# Patient Record
Sex: Female | Born: 1965 | Race: Black or African American | Hispanic: No | Marital: Single | Smoking: Current some day smoker
Health system: Southern US, Community
[De-identification: ages and names within clinical notes are randomized; demographics above are authoritative.]

## PROBLEM LIST (undated history)

## (undated) DIAGNOSIS — I10 Essential (primary) hypertension: Secondary | ICD-10-CM

## (undated) DIAGNOSIS — E119 Type 2 diabetes mellitus without complications: Secondary | ICD-10-CM

## (undated) HISTORY — DX: Essential (primary) hypertension: I10

## (undated) HISTORY — PX: OTHER SURGICAL HISTORY: SHX169

## (undated) HISTORY — DX: Type 2 diabetes mellitus without complications: E11.9

## (undated) HISTORY — PX: FRACTURE SURGERY: SHX138

## (undated) HISTORY — PX: HAND SURGERY: SHX662

## (undated) HISTORY — PX: TUBAL LIGATION: SHX77

---

## 2000-11-06 ENCOUNTER — Emergency Department (HOSPITAL_COMMUNITY): Admission: EM | Admit: 2000-11-06 | Discharge: 2000-11-06 | Payer: Self-pay | Admitting: Emergency Medicine

## 2000-11-06 ENCOUNTER — Encounter: Payer: Self-pay | Admitting: Emergency Medicine

## 2001-11-11 ENCOUNTER — Encounter (INDEPENDENT_AMBULATORY_CARE_PROVIDER_SITE_OTHER): Payer: Self-pay | Admitting: *Deleted

## 2001-11-14 ENCOUNTER — Other Ambulatory Visit: Admission: RE | Admit: 2001-11-14 | Discharge: 2001-11-14 | Payer: Self-pay | Admitting: Sports Medicine

## 2001-11-14 ENCOUNTER — Encounter: Admission: RE | Admit: 2001-11-14 | Discharge: 2001-11-14 | Payer: Self-pay | Admitting: Sports Medicine

## 2002-05-23 ENCOUNTER — Emergency Department (HOSPITAL_COMMUNITY): Admission: EM | Admit: 2002-05-23 | Discharge: 2002-05-23 | Payer: Self-pay | Admitting: Emergency Medicine

## 2002-05-23 ENCOUNTER — Encounter: Payer: Self-pay | Admitting: Emergency Medicine

## 2003-12-09 ENCOUNTER — Emergency Department (HOSPITAL_COMMUNITY): Admission: EM | Admit: 2003-12-09 | Discharge: 2003-12-09 | Payer: Self-pay

## 2003-12-23 ENCOUNTER — Emergency Department (HOSPITAL_COMMUNITY): Admission: EM | Admit: 2003-12-23 | Discharge: 2003-12-23 | Payer: Self-pay | Admitting: Emergency Medicine

## 2005-07-06 ENCOUNTER — Emergency Department (HOSPITAL_COMMUNITY): Admission: EM | Admit: 2005-07-06 | Discharge: 2005-07-06 | Payer: Self-pay | Admitting: Emergency Medicine

## 2005-07-07 ENCOUNTER — Emergency Department (HOSPITAL_COMMUNITY): Admission: EM | Admit: 2005-07-07 | Discharge: 2005-07-07 | Payer: Self-pay | Admitting: Emergency Medicine

## 2005-07-11 ENCOUNTER — Emergency Department (HOSPITAL_COMMUNITY): Admission: EM | Admit: 2005-07-11 | Discharge: 2005-07-11 | Payer: Self-pay | Admitting: Emergency Medicine

## 2006-11-10 DIAGNOSIS — F17200 Nicotine dependence, unspecified, uncomplicated: Secondary | ICD-10-CM | POA: Insufficient documentation

## 2006-11-11 ENCOUNTER — Encounter (INDEPENDENT_AMBULATORY_CARE_PROVIDER_SITE_OTHER): Payer: Self-pay | Admitting: *Deleted

## 2008-06-04 ENCOUNTER — Ambulatory Visit: Payer: Self-pay | Admitting: Family Medicine

## 2008-06-04 ENCOUNTER — Encounter (INDEPENDENT_AMBULATORY_CARE_PROVIDER_SITE_OTHER): Payer: Self-pay | Admitting: Family Medicine

## 2008-06-04 DIAGNOSIS — D649 Anemia, unspecified: Secondary | ICD-10-CM | POA: Insufficient documentation

## 2008-06-04 DIAGNOSIS — M5430 Sciatica, unspecified side: Secondary | ICD-10-CM | POA: Insufficient documentation

## 2008-06-04 LAB — CONVERTED CEMR LAB
Chlamydia, DNA Probe: NEGATIVE
Cholesterol: 153 mg/dL (ref 0–200)
GC Probe Amp, Genital: NEGATIVE
HCT: 31.4 % — ABNORMAL LOW (ref 36.0–46.0)
HDL: 57 mg/dL (ref >39)
Hemoglobin: 8.7 g/dL — ABNORMAL LOW (ref 12.0–15.0)
RBC: 4.37 M/uL (ref 3.87–5.11)
Total CHOL/HDL Ratio: 2.7
Triglycerides: 223 mg/dL — ABNORMAL HIGH (ref <150)

## 2008-06-11 ENCOUNTER — Telehealth (INDEPENDENT_AMBULATORY_CARE_PROVIDER_SITE_OTHER): Payer: Self-pay | Admitting: *Deleted

## 2008-06-13 ENCOUNTER — Ambulatory Visit: Payer: Self-pay | Admitting: Family Medicine

## 2008-06-13 ENCOUNTER — Encounter (INDEPENDENT_AMBULATORY_CARE_PROVIDER_SITE_OTHER): Payer: Self-pay | Admitting: Family Medicine

## 2008-06-19 ENCOUNTER — Telehealth (INDEPENDENT_AMBULATORY_CARE_PROVIDER_SITE_OTHER): Payer: Self-pay | Admitting: Family Medicine

## 2008-06-19 LAB — CONVERTED CEMR LAB
Iron: 18 ug/dL — ABNORMAL LOW (ref 42–145)
Retic Ct Pct: 1.5 % (ref 0.4–3.1)
Saturation Ratios: 4 % — ABNORMAL LOW (ref 20–55)
UIBC: 421 ug/dL

## 2008-06-24 ENCOUNTER — Telehealth (INDEPENDENT_AMBULATORY_CARE_PROVIDER_SITE_OTHER): Payer: Self-pay | Admitting: *Deleted

## 2008-07-08 ENCOUNTER — Ambulatory Visit: Payer: Self-pay | Admitting: Family Medicine

## 2008-07-31 ENCOUNTER — Telehealth (INDEPENDENT_AMBULATORY_CARE_PROVIDER_SITE_OTHER): Payer: Self-pay | Admitting: *Deleted

## 2008-08-02 ENCOUNTER — Ambulatory Visit: Payer: Self-pay | Admitting: Family Medicine

## 2008-08-02 DIAGNOSIS — N938 Other specified abnormal uterine and vaginal bleeding: Secondary | ICD-10-CM | POA: Insufficient documentation

## 2008-08-02 DIAGNOSIS — N949 Unspecified condition associated with female genital organs and menstrual cycle: Secondary | ICD-10-CM | POA: Insufficient documentation

## 2008-08-06 ENCOUNTER — Ambulatory Visit (HOSPITAL_COMMUNITY): Admission: RE | Admit: 2008-08-06 | Discharge: 2008-08-06 | Payer: Self-pay | Admitting: Family Medicine

## 2008-08-20 ENCOUNTER — Encounter: Payer: Self-pay | Admitting: Family Medicine

## 2008-08-20 ENCOUNTER — Ambulatory Visit: Payer: Self-pay | Admitting: Family Medicine

## 2008-08-23 ENCOUNTER — Encounter: Payer: Self-pay | Admitting: Family Medicine

## 2008-08-26 ENCOUNTER — Encounter: Admission: RE | Admit: 2008-08-26 | Discharge: 2008-08-26 | Payer: Self-pay | Admitting: Family Medicine

## 2008-09-11 ENCOUNTER — Encounter (INDEPENDENT_AMBULATORY_CARE_PROVIDER_SITE_OTHER): Payer: Self-pay | Admitting: Family Medicine

## 2008-09-17 ENCOUNTER — Telehealth (INDEPENDENT_AMBULATORY_CARE_PROVIDER_SITE_OTHER): Payer: Self-pay | Admitting: Family Medicine

## 2008-09-17 ENCOUNTER — Ambulatory Visit: Payer: Self-pay | Admitting: Family Medicine

## 2008-09-17 ENCOUNTER — Ambulatory Visit (HOSPITAL_COMMUNITY): Admission: RE | Admit: 2008-09-17 | Discharge: 2008-09-17 | Payer: Self-pay | Admitting: Family Medicine

## 2008-09-18 ENCOUNTER — Encounter (INDEPENDENT_AMBULATORY_CARE_PROVIDER_SITE_OTHER): Payer: Self-pay | Admitting: Family Medicine

## 2008-09-19 ENCOUNTER — Encounter: Admission: RE | Admit: 2008-09-19 | Discharge: 2008-09-19 | Payer: Self-pay | Admitting: Infectious Diseases

## 2008-09-26 ENCOUNTER — Telehealth (INDEPENDENT_AMBULATORY_CARE_PROVIDER_SITE_OTHER): Payer: Self-pay | Admitting: Family Medicine

## 2008-10-24 ENCOUNTER — Encounter (INDEPENDENT_AMBULATORY_CARE_PROVIDER_SITE_OTHER): Payer: Self-pay | Admitting: Family Medicine

## 2008-10-24 ENCOUNTER — Ambulatory Visit: Payer: Self-pay | Admitting: Family Medicine

## 2008-10-28 ENCOUNTER — Ambulatory Visit: Payer: Self-pay | Admitting: Family Medicine

## 2008-10-31 ENCOUNTER — Ambulatory Visit: Payer: Self-pay | Admitting: Family Medicine

## 2009-02-24 ENCOUNTER — Encounter (INDEPENDENT_AMBULATORY_CARE_PROVIDER_SITE_OTHER): Payer: Self-pay | Admitting: Family Medicine

## 2009-02-24 DIAGNOSIS — D259 Leiomyoma of uterus, unspecified: Secondary | ICD-10-CM | POA: Insufficient documentation

## 2009-02-24 DIAGNOSIS — R928 Other abnormal and inconclusive findings on diagnostic imaging of breast: Secondary | ICD-10-CM | POA: Insufficient documentation

## 2009-05-09 ENCOUNTER — Telehealth: Payer: Self-pay | Admitting: Family Medicine

## 2009-05-12 ENCOUNTER — Telehealth: Payer: Self-pay | Admitting: *Deleted

## 2009-05-12 ENCOUNTER — Ambulatory Visit: Payer: Self-pay | Admitting: Family Medicine

## 2009-05-22 ENCOUNTER — Ambulatory Visit: Payer: Self-pay | Admitting: Family Medicine

## 2009-07-18 ENCOUNTER — Ambulatory Visit: Payer: Self-pay | Admitting: Family Medicine

## 2009-07-21 ENCOUNTER — Encounter: Payer: Self-pay | Admitting: Family Medicine

## 2009-07-21 ENCOUNTER — Ambulatory Visit: Payer: Self-pay | Admitting: Family Medicine

## 2009-09-18 ENCOUNTER — Encounter: Admission: RE | Admit: 2009-09-18 | Discharge: 2009-09-18 | Payer: Self-pay | Admitting: Family Medicine

## 2009-09-25 ENCOUNTER — Telehealth: Payer: Self-pay | Admitting: Family Medicine

## 2009-09-29 ENCOUNTER — Ambulatory Visit: Payer: Self-pay | Admitting: Family Medicine

## 2009-09-29 ENCOUNTER — Telehealth: Payer: Self-pay | Admitting: Family Medicine

## 2009-10-03 ENCOUNTER — Encounter: Payer: Self-pay | Admitting: Family Medicine

## 2009-10-14 ENCOUNTER — Encounter: Admission: RE | Admit: 2009-10-14 | Discharge: 2009-10-14 | Payer: Self-pay | Admitting: Family Medicine

## 2009-11-18 ENCOUNTER — Encounter: Payer: Self-pay | Admitting: *Deleted

## 2010-03-17 ENCOUNTER — Telehealth: Payer: Self-pay | Admitting: Family Medicine

## 2010-03-17 ENCOUNTER — Ambulatory Visit: Payer: Self-pay | Admitting: Family Medicine

## 2010-08-18 ENCOUNTER — Encounter: Payer: Self-pay | Admitting: Family Medicine

## 2010-10-04 ENCOUNTER — Encounter: Payer: Self-pay | Admitting: Family Medicine

## 2010-10-13 NOTE — Progress Notes (Signed)
Summary: triage  Phone Note Call from Patient Call back at [phone]   Caller: Patient Summary of Call: ate chinese food on Saturday and is now breaking out in white bumps. Initial call taken by: De Nurse,  September 29, 2009 10:13 AM  Follow-up for Phone Call        1 on side of face. 3 on stomach & 1 on leg & 1 on buttock. large white bumps with pus. most have opened on their own. work in at 1:30 today. aware of wait. cannot come sooner.Golden Circle RN  September 29, 2009 10:24 AM  Follow-up by: Golden Circle RN,  September 29, 2009 10:21 AM

## 2010-10-13 NOTE — Assessment & Plan Note (Signed)
Summary: bumps on body/[place]/caviness   Vital Signs:  Patient profile:   45 year old female Height:      63.5 inches Weight:      171.3 pounds BMI:     29.98 Temp:     98.1 degrees F oral Pulse rate:   87 / minute BP sitting:   121 / 79  (left arm) Cuff size:   regular  Vitals Entered By: Gladstone Pih (September 29, 2009 11:14 AM) CC: C/O bumps on body X3 days Is Patient Diabetic? No Pain Assessment Patient in pain? no        Primary Care Provider:  Ellin Mayhew MD  CC:  C/O bumps on body X3 days.  History of Present Illness: 45 y/o female c/o several bumps on her body x3 days. one on foreahead near edge of hairline on R, two on abdomen, one on L inner thigh. They are not pruritic. similar to lesions she has had in the past. one on head very tender, the others are not. no drainage. no fever. PMH of folliculitis.   Habits & Providers  Alcohol-Tobacco-Diet     Tobacco Status: current     Tobacco Counseling: to quit use of tobacco products     Cigarette Packs/Day: <0.25  Current Medications (verified): 1)  Doxycycline Hyclate 100 Mg Caps (Doxycycline Hyclate) .... One Tab By Mouth Two Times A Day X 7 Days  Allergies (verified): No Known Drug Allergies  Social History: Packs/Day:  <0.25  Physical Exam  General:  well appearing female, NAD. vitals reviewed Skin:  4-5 scattered papular lesions (trunk, forehead, L thigh). no surrounding erythema. most indurated, one on R forehead with TTP.    Impression & Recommendations:  Problem # 1:  SKIN RASH (ICD-782.1)  likely folliculitis. no areas requiring I&D. rx for doxycycline. red flags given. f/u as needed.   Orders: [facility]- Est Level  3 (09323)  Complete Medication List: 1)  Doxycycline Hyclate 100 Mg Caps (Doxycycline hyclate) .... One tab by mouth two times a day x 7 days  Patient Instructions: 1)  Nice to have met you! 2)  Take all of the antibiotic. 3)  If you have redness, drainage, fever, or worsening pain,  call our office right away. Prescriptions: DOXYCYCLINE HYCLATE 100 MG CAPS (DOXYCYCLINE HYCLATE) one tab by mouth two times a day x 7 days  #14 x 0   Entered and Authorized by:   Lequita Asal  MD   Signed by:   Lequita Asal  MD on 09/29/2009   Method used:   Faxed to ...       [facility] (retail)       [address] [place], [place]  [address]       Ph: [phone]       Fax: [phone]   RxID:   [id number]

## 2010-10-13 NOTE — Progress Notes (Signed)
Summary: phone conversation on mammography results  Tried to call patient and ensure that she had received mammography results and that follow up appt has been scheduled.  Pt did not answer telephone. Will try to call back at a later time.  Betsy Coder, MD  09/25/09 at 1730  I attempted to call pt to ensure that she is scheduled for appointment with mammography center for biopsy.  Pt not home and cell phone number not working.  Will try to call back a t a later time. Ellin Mayhew MD  October 08, 2009 1500  attempted again to call pt.  Not a home.  Family gave me her cell phone number.  The cell phone number is not an operating number.  Will try to call back later.  Ellin Mayhew MD  October 09, 2009 10:06 AM   Called pt cell phone number.  Left message.  Asked her to please call me back and confirm that the [facility] center of [place] has called her and scheduled an appointment.  Ellin Mayhew MD  October 09, 2009 2:26 PM   Asked red team staff to please call the breast center and ensure that pt has a follow up appointment with them.  She has not called me back to confirm that she has been scheduled for further testing.  Ellin Mayhew MD  October 20, 2009 8:10 AM   Recieved further imaging results from follow up visit on 10/14/09.  Further imaging showed no evidence of malignancy.  Radiology recommended monthly breast self exams and follow up with pcp.  Spoke with pt on phone to ensure that she received these results.  It is time for pt's yearly exam.  Pt agrees to come in for physical exam.  That way I can also perform breast exam which is part of CPE and I will know pt baseline breast exam since pt is new to me.  she states that she didn't feel any lumps in breast that the area that was seen was seen on regular screening mammogram.  Office staff to call pt and schedule for CPE.  Ellin Mayhew MD  October 21, 2009 2:50 PM

## 2010-10-13 NOTE — Miscellaneous (Signed)
  Clinical Lists Changes     Received order form from [facility], for orders to perform Diagnostic mammogram, [place] and imaging-guided biopsy for previous abnormal screening mammogram.   I am signing order to be faxed to [facility] fax [phone]; I will forward to Dr. Edmonia Xai Frerking to address with her patient.  The letter from [facility] notes that "as in the past, the staff of the [facility] will contact the patient to schedule the exam."  Paula Compton MD  October 03, 2009 12:33 PM

## 2010-10-13 NOTE — Letter (Signed)
Summary: Probation Letter  [facility]  [address]   [place], [place] [address]   Phone: [phone]  Fax: [phone]    11/18/2009  [facility] [address] [place], [place]  [address]  Dear Ms. Blumenthal,  With the goal of better serving all our patients the [facility] is following each patient's missed appointments.  You have missed at least 3 appointments with our practice.If you cannot keep your appointment, we expect you to call at least 24 hours before your appointment time.  Missing appointments prevents other patients from seeing [place] and makes it difficult to provide you with the best possible medical care.      1.   If you miss one more appointment, we will only give you limited medical services. This means we will not call in medication refills, complete a form, or make a referral for you except when you are here for a scheduled office visit.    2.   If you miss 2 or more appointments in the next year, we will dismiss you from our practice.    Our office staff can be reached at [phone] Monday through Friday from 8:30 a.m.-5:00 p.m. and will be glad to schedule your appointment as necessary.    Thank you.   [facility]  Appended Document: Probation Letter letter returned unable to forward.

## 2010-10-13 NOTE — Assessment & Plan Note (Signed)
Summary: irregular bleeding/[place]/caviness   Vital Signs:  Patient profile:   45 year old female Height:      63.5 inches Weight:      168 pounds BMI:     29.40 Temp:     98.9 degrees F oral Pulse rate:   74 / minute BP sitting:   144 / 82  (left arm) Cuff size:   regular  Vitals Entered By: Tessie Fass CMA (March 17, 2010 2:11 PM) CC: Irregular menses  Does patient need assistance? Functional Status Self care Ambulation Normal   Primary Provider:  Ellin Mayhew MD  CC:  Irregular menses.  History of Present Illness: Pt is a 45 year old AA F presenting with irregular menses.  She began a period on 6/1 which lasted for 5 days, then began another on 6/24, which is still ongoing.  She states that the bleeding became heavier over the weekend, with blood clots the size of "her fist."  She is going through 8-10 pads/tampons per day since Friday.  She has also experienced cramping in her lower abdomen and back.  She states she has had some weakness but no dizziness, N/V, or fevers.  Fibroids were found on U/S in 07/2008.  She experienced a similar episode in 6/10 for which she was given Provera.  At that time she had an endometrial biopsy because of her history of smoking (now smoking 3 cig/day) and abnormal bleeding, which was normal. She is unsure at what age her mother went through menopause.  Habits & Providers  Alcohol-Tobacco-Diet     Tobacco Status: current     Tobacco Counseling: to quit use of tobacco products     Cigarette Packs/Day: <0.25  Allergies: No Known Drug Allergies  Past History:  Past Medical History: Last updated: 02/24/2009 seasonal allergies [id number] NSVD without complications no history of abnormal paps irregular bleeding, fibroids found on [place] 11/09 negative endometrial Biopsy 2009 iron deficiency anemia abnormality on mammogram and breast US 2010--needs biopsy--do not have these results  Past Surgical History: Last updated: 11/10/2006 bilateral  tubal ligation -, [id number] - 11/19/2001  Risk Factors: Smoking Status: current (03/17/2010) Packs/Day: <0.25 (03/17/2010)  Physical Exam  General:  Well-developed,well-nourished,in no acute distress; alert,appropriate and cooperative throughout examination Lungs:  normal respiratory effort and normal breath sounds.   Heart:  normal rate, regular rhythm, no murmur, no gallop, and no rub.   Abdomen:  soft, non-tender, normal bowel sounds, no distention, no masses, no guarding, no rigidity, and no rebound tenderness.      Impression & Recommendations:  Problem # 1:  DYSFUNCTIONAL UTERINE BLEEDING (ICD-626.8)  Not concerned about endometrial cancer at this time due to normal endometrial biopsy 02/2009. Most likely a combination of perimenopausal state and fibriods.  Since Provera helped to stop the bleeding the last time she experienced this, we will give Provera 10mg  1 tab by mouth once daily for 10 days to help stop her current bleeding.  Advised pt that she will have withdrawal bleeding once the Provera is stopped.  Since we are unsure if she is starting to go through menopause, I also offered her the option to have a referral to GYN clinic for possible endometrial ablation.  She denied a referral at this time and wants to wait, but will discuss this option if the bleeding continues.   Orders: [facility]- Est  Level 4 (72536)  Complete Medication List: 1)  Provera 10 Mg Tabs (Medroxyprogesterone acetate) .... Take 1 pill by mouth once daily  for 10 days. Prescriptions: PROVERA 10 MG TABS (MEDROXYPROGESTERONE ACETATE) Take 1 pill by mouth once daily for 10 days.  #10 x 0   Entered and Authorized by:   Demetria Pore MD   Signed by:   Demetria Pore MD on 03/17/2010   Method used:   Print then Give to Patient   RxID:   [id number]

## 2010-10-13 NOTE — Miscellaneous (Signed)
  Clinical Lists Changes  Problems: Removed problem of SKIN RASH (ICD-782.1) Removed problem of RHINITIS (ICD-477.9) Removed problem of ELEVATED BP READING WITHOUT DX HYPERTENSION (ICD-796.2) Removed problem of ROUTINE GYNECOLOGICAL EXAMINATION (ICD-V72.31) Removed problem of SCREENING FOR MALIGNANT NEOPLASM OF THE CERVIX (ICD-V76.2)      Allergies: No Known Drug Allergies

## 2010-10-13 NOTE — Progress Notes (Signed)
Summary: triage  Phone Note Call from Patient Call back at [phone]   Caller: Patient Summary of Call: needs to talk to nurse about irregular periods Initial call taken by: De Nurse,  March 17, 2010 11:41 AM  Follow-up for Phone Call        period has been "on & off x 3 wks"  large blood clots x2 . heavier today. very worried & wants to be seen asap. work in at 1:30. aware of wait. declined appt tomorrow Follow-up by: Golden Circle RN,  March 17, 2010 12:03 PM

## 2011-01-29 NOTE — Op Note (Signed)
[place]  Patient:    Tamara Barnes, Tamara Barnes                     MRN: [id number] Proc. Date: 11/06/00 Adm. Date:  [id number] Attending:  Benny Lennert                           Operative Report  CLINICAL NOTE:  Tamara Barnes presents to the emergency room today secondary to injuries sustained while she fell and placed her right-hand dominant right hand through the back door window.  The patient was seen by Tomi Bamberger in he [facility] region.  Large lacerations were noted and embedded glass approximately 2 cm in the thenar region was noted.  I was asked to see her in regard to this.  The patients last tetanus shot was three years ago.  She denies numbness or tingling at the present time but is somewhat painful and difficult to examine.  She has had Dilaudid 2 mg prior to my exam.  ALLERGIES:  None.  MEDICATIONS:  None.  PAST SURGICAL HISTORY:  Bilateral tubal ligation.  PAST MEDICAL HISTORY:  None.  SOCIAL HISTORY:  Patient works at [employer].  She smokes one pack per day.  She occasionally drinks.  PHYSICAL EXAMINATION:  GENERAL:  A black female, alert and oriented, in no acute distress.  MUSCULOSKELETAL:  The patient has a large scratch about her volar forearm. She has a 4 cm laceration over the distal volar forearm and wrist region.  She has a 3 cm laceration about the proximal metacarpal level and a 1 cm laceration about the dorsal radial aspect of the thumb MCP joint.  She does not have any signs of compartment syndrome.  Light touch is intact about the radial and ulnar digital nerve distributions; however, it is difficult to examine her, I should note.  MPL appears to be intact; however, her exam is compromised by her pain.  The patient has some superficial abrasions over the small finger dorsally.  X-rays show a large 2 cm embedded glass piece about the radial aspect of the thumb just proximal to the MCP joint.  There  is no fracture or dislocation noted.  IMPRESSION:  Status post right hand and wrist injury secondary to falling through a back door window, with multiple lacerations and embedded glass throughout the right thumb.  PLAN:  I verbally consented her for I&D and repair of structures as necessary with removal of glass and exploration.  She desires to proceed.  DESCRIPTION OF PROCEDURE:  The patient underwent a median nerve and superficial radial nerve block at the wrist with lidocaine 1% without epinephrine by myself.  She also underwent general field block.  Once this was done, she was prepped and draped in the usual sterile fashion.  Following this, an I&D was performed of skin and subcutaneous tissue about the 4 cm volar radial laceration.  This was then closed with combination of Prolene and chromic suture.  She tolerated this well.  Once this was done, the patients 3 cm laceration about the dorsal radial aspect of the thumb overlying the proximal portion of the metacarpal radially was I&Dd, including skin and subcutaneous tissue.  It was explored.  It did not go deep.  It was then closed with interrupted Prolene and chromic.  This was done after exploration. Following this, the patient had I&D of a 1 cm laceration of the radial aspect  of the MCP region.  A 1 cm extension was made proxmially to allow for exploration of structures.  The patient then had a 2 cm large piece of glass removed.  This was removed in its entirety.  The area was then copiously irrigated and closed loosely with Prolene and chromic suture.  The patient had soft compartments at the end of this procedure.  Xeroform followed by gauze, Kerlix, and a thumb spica splint was then applied.  She tolerated this well without difficulty.  I have asked the patient to move her thumb and fingers frequently, elevate, and notify me should any abrupt changes occur.  Otherwise, I am going to see her in three days for repeat  neurovascular examination, as I would like to better evaluate the radial digital nerve without any pain medicine on board (the patient had Dilaudid prior to initial exam).  The patient and I discussed all issues.  At the conclusion of the procedure, the patient did have a good FPL and EPL function.  All questions have been encouraged and answered. Prescriptions for Keflex as well as Vicodin were given to the patient. DD:  11/06/00 TD:  11/07/00 Job: [id number]

## 2012-01-02 ENCOUNTER — Emergency Department (HOSPITAL_COMMUNITY): Payer: Self-pay

## 2012-01-02 ENCOUNTER — Emergency Department (HOSPITAL_COMMUNITY)
Admission: EM | Admit: 2012-01-02 | Discharge: 2012-01-02 | Disposition: A | Discharge status: Home / Self Care | Payer: Self-pay | Attending: Emergency Medicine | Admitting: Emergency Medicine

## 2012-01-02 ENCOUNTER — Encounter (HOSPITAL_COMMUNITY): Payer: Self-pay | Admitting: Emergency Medicine

## 2012-01-02 DIAGNOSIS — M25469 Effusion, unspecified knee: Secondary | ICD-10-CM | POA: Insufficient documentation

## 2012-01-02 DIAGNOSIS — F17200 Nicotine dependence, unspecified, uncomplicated: Secondary | ICD-10-CM | POA: Insufficient documentation

## 2012-01-02 DIAGNOSIS — M1990 Unspecified osteoarthritis, unspecified site: Secondary | ICD-10-CM

## 2012-01-02 DIAGNOSIS — M25569 Pain in unspecified knee: Secondary | ICD-10-CM | POA: Insufficient documentation

## 2012-01-02 DIAGNOSIS — M25461 Effusion, right knee: Secondary | ICD-10-CM

## 2012-01-02 MED ORDER — HYDROCODONE-ACETAMINOPHEN 5-500 MG PO TABS: 1.0000 | ORAL_TABLET | Freq: Four times a day (QID) | ORAL | Status: AC | PRN

## 2012-01-02 MED ORDER — MELOXICAM 15 MG PO TABS: 15.0000 mg | ORAL_TABLET | Freq: Every day | ORAL | Status: DC

## 2012-01-02 NOTE — Discharge Instructions (Signed)
Arthritis, Nonspecific Arthritis is inflammation of a joint. This usually means pain, redness, warmth or swelling are present. One or more joints may be involved. There are a number of types of arthritis. Your caregiver may not be able to tell what type of arthritis you have right away. CAUSES  The most common cause of arthritis is the wear and tear on the joint (osteoarthritis). This causes damage to the cartilage, which can break down over time. The knees, hips, back and neck are most often affected by this type of arthritis. Other types of arthritis and common causes of joint pain include:  Sprains and other injuries near the joint. Sometimes minor sprains and injuries cause pain and swelling that develop hours later.   Rheumatoid arthritis. This affects hands, feet and knees. It usually affects both sides of your body at the same time. It is often associated with chronic ailments, fever, weight loss and general weakness.   Crystal arthritis. Gout and pseudo gout can cause occasional acute severe pain, redness and swelling in the foot, ankle, or knee.   Infectious arthritis. Bacteria can get into a joint through a break in overlying skin. This can cause infection of the joint. Bacteria and viruses can also spread through the blood and affect your joints.   Drug, infectious and allergy reactions. Sometimes joints can become mildly painful and slightly swollen with these types of illnesses.  SYMPTOMS   Pain is the main symptom.   Your joint or joints can also be red, swollen and warm or hot to the touch.   You may have a fever with certain types of arthritis, or even feel overall ill.   The joint with arthritis will hurt with movement. Stiffness is present with some types of arthritis.  DIAGNOSIS  Your caregiver will suspect arthritis based on your description of your symptoms and on your exam. Testing may be needed to find the type of arthritis:  Blood and sometimes urine tests.    X-ray tests and sometimes CT or MRI scans.   Removal of fluid from the joint (arthrocentesis) is done to check for bacteria, crystals or other causes. Your caregiver (or a specialist) will numb the area over the joint with a local anesthetic, and use a needle to remove joint fluid for examination. This procedure is only minimally uncomfortable.   Even with these tests, your caregiver may not be able to tell what kind of arthritis you have. Consultation with a specialist (rheumatologist) may be helpful.  TREATMENT  Your caregiver will discuss with you treatment specific to your type of arthritis. If the specific type cannot be determined, then the following general recommendations may apply. Treatment of severe joint pain includes:  Rest.   Elevation.   Anti-inflammatory medication (for example, ibuprofen) may be prescribed. Avoiding activities that cause increased pain.   Only take over-the-counter or prescription medicines for pain and discomfort as recommended by your caregiver.   Cold packs over an inflamed joint may be used for 10 to 15 minutes every hour. Hot packs sometimes feel better, but do not use overnight. Do not use hot packs if you are diabetic without your caregiver's permission.   A cortisone shot into arthritic joints may help reduce pain and swelling.   Any acute arthritis that gets worse over the next 1 to 2 days needs to be looked at to be sure there is no joint infection.  Long-term arthritis treatment involves modifying activities and lifestyle to reduce joint stress jarring. This can  include weight loss. Also, exercise is needed to nourish the joint cartilage and remove waste. This helps keep the muscles around the joint strong. HOME CARE INSTRUCTIONS   Do not take aspirin to relieve pain if gout is suspected. This elevates uric acid levels.   Only take over-the-counter or prescription medicines for pain, discomfort or fever as directed by your caregiver.    Rest the joint as much as possible.   If your joint is swollen, keep it elevated.   Use crutches if the painful joint is in your leg.   Drinking plenty of fluids may help for certain types of arthritis.   Follow your caregiver's dietary instructions.   Try low-impact exercise such as:   Swimming.   Water aerobics.   Biking.   Walking.   Morning stiffness is often relieved by a warm shower.   Put your joints through regular range-of-motion.  SEEK MEDICAL CARE IF:   You do not feel better in 24 hours or are getting worse.   You have side effects to medications, or are not getting better with treatment.  SEEK IMMEDIATE MEDICAL CARE IF:   You have a fever.   You develop severe joint pain, swelling or redness.   Many joints are involved and become painful and swollen.   There is severe back pain and/or leg weakness.   You have loss of bowel or bladder control.  Document Released: 10/07/2004 Document Revised: 08/19/2011 Document Reviewed: 10/23/2008 [facility] Patient Information 2012 [place], [place].Knee Effusion The medical term for having fluid in your knee is effusion. This is often due to an internal derangement of the knee. This means something is wrong inside the knee. Some of the causes of fluid in the knee may be torn cartilage, a torn ligament, or bleeding into the joint from an injury. Your knee is likely more difficult to bend and move. This is often because there is increased pain and pressure in the joint. The time it takes for recovery from a knee effusion depends on different factors, including:   Type of injury.   Your age.   Physical and medical conditions.   Rehabilitation Strategies.  How long you will be away from your normal activities will depend on what kind of knee problem you have and how much damage is present. Your knee has two types of cartilage. Articular cartilage covers the bone ends and lets your knee bend and move smoothly. Two menisci,  thick pads of cartilage that form a rim inside the joint, help absorb shock and stabilize your knee. Ligaments bind the bones together and support your knee joint. Muscles move the joint, help support your knee, and take stress off the joint itself. CAUSES  Often an effusion in the knee is caused by an injury to one of the menisci. This is often a tear in the cartilage. Recovery after a meniscus injury depends on how much meniscus is damaged and whether you have damaged other knee tissue. Small tears may heal on their own with conservative treatment. Conservative means rest, limited weight bearing activity and muscle strengthening exercises. Your recovery may take up to 6 weeks.  TREATMENT  Larger tears may require surgery. Meniscus injuries may be treated during arthroscopy. Arthroscopy is a procedure in which your surgeon uses a small telescope like instrument to look in your knee. Your caregiver can make a more accurate diagnosis (learning what is wrong) by performing an arthroscopic procedure. If your injury is on the inner margin of the meniscus,  your surgeon may trim the meniscus back to a smooth rim. In other cases your surgeon will try to repair a damaged meniscus with stitches (sutures). This may make rehabilitation take longer, but may provide better long term result by helping your knee keep its shock absorption capabilities. Ligaments which are completely torn usually require surgery for repair. HOME CARE INSTRUCTIONS  Use crutches as instructed.   If a brace is applied, use as directed.   Once you are home, an ice pack applied to your swollen knee may help with discomfort and help decrease swelling.   Keep your knee raised (elevated) when you are not up and around or on crutches.   Only take over-the-counter or prescription medicines for pain, discomfort, or fever as directed by your caregiver.   Your caregivers will help with instructions for rehabilitation of your knee. This often  includes strengthening exercises.   You may resume a normal diet and activities as directed.  SEEK MEDICAL CARE IF:   There is increased swelling in your knee.   You notice redness, swelling, or increasing pain in your knee.   An unexplained oral temperature above 102 F (38.9 C) develops.  SEEK IMMEDIATE MEDICAL CARE IF:   You develop a rash.   You have difficulty breathing.   You have any allergic reactions from medications you may have been given.   There is severe pain with any motion of the knee.  MAKE SURE YOU:   Understand these instructions.   Will watch your condition.   Will get help right away if you are not doing well or get worse.  Document Released: 11/20/2003 Document Revised: 08/19/2011 Document Reviewed: 01/24/2008 [facility] Patient Information 2012 [place], [place].

## 2012-01-02 NOTE — ED Provider Notes (Signed)
History     CSN: [id number]  Arrival date & time 01/02/12  1621   First MD Initiated Contact with Patient 01/02/12 1631      Chief Complaint  Patient presents with  . Knee Pain    (Consider location/radiation/quality/duration/timing/severity/associated sxs/prior treatment) Patient is a 46 y.o. female presenting with knee pain. The history is provided by the patient.  Knee Pain This is a recurrent problem. Episode onset: Worsened 3 days ago but has been ongoing for a year. The problem occurs constantly. The problem has been gradually worsening. Associated symptoms comments: No fever, redness. Swelling of the joint. The symptoms are aggravated by walking and standing. The symptoms are relieved by nothing. She has tried rest for the symptoms. The treatment provided mild relief.    History reviewed. No pertinent past medical history.  Past Surgical History  Procedure Date  . Tuabl ligation     No family history on file.  History  Substance Use Topics  . Smoking status: Current Everyday Smoker  . Smokeless tobacco: Not on file  . Alcohol Use: Yes    OB History    Grav Para Term Preterm Abortions TAB SAB Ect Mult Living                  Review of Systems  Constitutional: Negative for fever.  Musculoskeletal: Positive for joint swelling. Negative for back pain and gait problem.  All other systems reviewed and are negative.    Allergies  Review of patient's allergies indicates no known allergies.  Home Medications   Current Outpatient Rx  Name Route Sig Dispense Refill  . OXYMETAZOLINE HCL 0.05 % NA SOLN Nasal Place 2 sprays into the nose 2 (two) times daily.      BP 138/90  Pulse 96  Temp(Src) 98.1 F (36.7 C) (Oral)  Resp 16  SpO2 99%  LMP 12/26/2011  Physical Exam  Nursing note and vitals reviewed. Constitutional: She is oriented to person, place, and time. She appears well-developed and well-nourished. No distress.  HENT:  Head: Normocephalic and  atraumatic.  Eyes: EOM are normal. Pupils are equal, round, and reactive to light.  Musculoskeletal:       Right knee: She exhibits effusion. She exhibits normal range of motion, no deformity, no erythema, no LCL laxity and no MCL laxity. No medial joint line and no lateral joint line tenderness noted.       Swelling proximal to the patella with crepitus over the joint with bending the knee.  Neurological: She is alert and oriented to person, place, and time. She has normal strength. No sensory deficit.  Skin: Skin is warm and dry. No rash noted. No erythema.  Psychiatric: She has a normal mood and affect. Her behavior is normal.    ED Course  Procedures (including critical care time)  Labs Reviewed - No data to display Dg Knee Complete 4 Views Right  01/02/2012  *RADIOLOGY REPORT*  Clinical Data: Knee pain and swelling for 3 weeks.  No known injury.  RIGHT KNEE - COMPLETE 4+ VIEW  Comparison: None.  Findings: There is no fracture or dislocation.  No joint effusion. Very small medial and patellofemoral osteophytes are noted.  IMPRESSION: No acute finding.  Original Report Authenticated By: Bernadene Bell. Maricela Curet, M.D.     No diagnosis found.    MDM   Patient with intermittent right knee pain for the last year. It's gotten significantly worse over the last 3 days. On exam she has proximal patellar effusion  but full range of motion of the knee with some crepitus on bending. No signs of a septic joint and symptoms were suggestive of arthritis. Plain films pending. `   5:30 PM Osteophytes noted in the knee consistent with arthritis but otherwise negative. Findings discussed with the patient. She was placed on anti-inflammatories and given pain control. She will followup with her regular doctor for further testing and treatment.     Gwyneth Sprout, MD 01/02/12 1731

## 2012-01-02 NOTE — ED Notes (Signed)
Patient transported to X-ray 

## 2012-01-02 NOTE — ED Notes (Signed)
C/o R knee pain x 1 year.  Pain worse over past 3 days.   No known injury.

## 2012-06-11 ENCOUNTER — Emergency Department (HOSPITAL_COMMUNITY)
Admission: EM | Admit: 2012-06-11 | Discharge: 2012-06-11 | Disposition: A | Discharge status: Home / Self Care | Payer: Self-pay | Attending: Emergency Medicine | Admitting: Emergency Medicine

## 2012-06-11 ENCOUNTER — Encounter (HOSPITAL_COMMUNITY): Payer: Self-pay | Admitting: Emergency Medicine

## 2012-06-11 DIAGNOSIS — H9209 Otalgia, unspecified ear: Secondary | ICD-10-CM | POA: Insufficient documentation

## 2012-06-11 DIAGNOSIS — J329 Chronic sinusitis, unspecified: Secondary | ICD-10-CM | POA: Insufficient documentation

## 2012-06-11 DIAGNOSIS — F17200 Nicotine dependence, unspecified, uncomplicated: Secondary | ICD-10-CM | POA: Insufficient documentation

## 2012-06-11 DIAGNOSIS — J310 Chronic rhinitis: Secondary | ICD-10-CM | POA: Insufficient documentation

## 2012-06-11 MED ORDER — SODIUM CHLORIDE 0.65 % NA SOLN: 1.0000 | NASAL | Status: DC | PRN

## 2012-06-11 MED ORDER — OXYCODONE-ACETAMINOPHEN 5-325 MG PO TABS: 1.0000 | ORAL_TABLET | Freq: Four times a day (QID) | ORAL | Status: DC | PRN

## 2012-06-11 MED ORDER — CETIRIZINE HCL 10 MG PO TABS: 10.0000 mg | ORAL_TABLET | Freq: Every day | ORAL | Status: DC

## 2012-06-11 MED ORDER — SULFAMETHOXAZOLE-TRIMETHOPRIM 800-160 MG PO TABS: 1.0000 | ORAL_TABLET | Freq: Two times a day (BID) | ORAL | Status: DC

## 2012-06-11 MED ORDER — FLUTICASONE PROPIONATE 50 MCG/ACT NA SUSP: 2.0000 | Freq: Every day | NASAL | Status: DC

## 2012-06-11 NOTE — ED Provider Notes (Signed)
History     CSN: [id number]  Arrival date & time 06/11/12  4098   First MD Initiated Contact with Patient 06/11/12 0719      Chief Complaint  Patient presents with  . Sore Throat    (Consider location/radiation/quality/duration/timing/severity/associated sxs/prior treatment) HPI Comments: Tamara Barnes presents for evaluation of facial pressure and sinus drainage.  She states she has developed a sore throat over the last 2-3 days but reports that she had experienced sinus pressure prior to the onset of the throat discomfort.  She denies fever, but describes pressure and pain behind her eyes, in her midface, in her ears, and copious nasal and post nasal drainage.  She denies specific pain with eye movements, rashes, neck stiffness, jaw pain, dental pain, steroid usage, hx HIV.  She reports a 30 minute nosebleed yesterday also.  Tamara Barnes reports chronic seasonal allergies and states she was prescribed afrin last year by her PMD.  She has been using it on a near daily basis since then.  She states it is not working as well recently.  Patient is a 46 y.o. female presenting with pharyngitis. The history is provided by the patient. No language interpreter was used.  Sore Throat This is a new problem. The current episode started 2 days ago. The problem occurs constantly. The problem has been gradually worsening. Associated symptoms include headaches. Pertinent negatives include no chest pain, no abdominal pain and no shortness of breath. The symptoms are aggravated by swallowing, eating, sneezing and coughing. Nothing relieves the symptoms.    No past medical history on file.  Past Surgical History  Procedure Date  . Tuabl ligation     No family history on file.  History  Substance Use Topics  . Smoking status: Current Every Day Smoker  . Smokeless tobacco: Not on file  . Alcohol Use: Yes    OB History    Grav Para Term Preterm Abortions TAB SAB Ect Mult Living                   Review of Systems  Constitutional: Positive for chills and fatigue. Negative for fever, diaphoresis, activity change and appetite change.  HENT: Positive for ear pain, nosebleeds, congestion, sore throat, rhinorrhea, sneezing, trouble swallowing, neck pain, postnasal drip and sinus pressure. Negative for hearing loss, facial swelling, neck stiffness, dental problem, voice change, tinnitus and ear discharge.   Eyes: Negative for photophobia, pain, discharge, redness and itching.  Respiratory: Positive for cough. Negative for choking, chest tightness, shortness of breath and stridor.   Cardiovascular: Negative for chest pain, palpitations and leg swelling.  Gastrointestinal: Negative for nausea, vomiting, abdominal pain and diarrhea.  Genitourinary: Negative.   Musculoskeletal: Negative for myalgias, back pain and arthralgias.  Skin: Negative for pallor and rash.  Neurological: Positive for headaches. Negative for dizziness, syncope, weakness and light-headedness.  Hematological: Negative for adenopathy. Does not bruise/bleed easily.  Psychiatric/Behavioral: Negative.     Allergies  Review of patient's allergies indicates no known allergies.  Home Medications   Current Outpatient Rx  Name Route Sig Dispense Refill  . MELOXICAM 15 MG PO TABS Oral Take 1 tablet (15 mg total) by mouth daily. 30 tablet 0  . OXYMETAZOLINE HCL 0.05 % NA SOLN Nasal Place 2 sprays into the nose 2 (two) times daily.      BP 162/9  Temp 98.4 F (36.9 C)  Resp 18  LMP 06/04/2012  Physical Exam  Nursing note and vitals reviewed. Constitutional: She is oriented  to person, place, and time. She appears well-developed and well-nourished. She is active.  Non-toxic appearance. She does not have a sickly appearance. She appears ill. No distress. She is not intubated.  HENT:  Head: Normocephalic and atraumatic. Head is without raccoon's eyes, without Battle's sign, without right periorbital erythema and without  left periorbital erythema. No trismus in the jaw.  Right Ear: Hearing, external ear and ear canal normal. No drainage. No mastoid tenderness. Tympanic membrane is not injected, not erythematous, not retracted and not bulging. No middle ear effusion. No hemotympanum.  Left Ear: Hearing, external ear and ear canal normal. No drainage. No mastoid tenderness. Tympanic membrane is bulging. Tympanic membrane is not injected, not erythematous and not retracted. A middle ear effusion is present. No hemotympanum.  Nose: Mucosal edema, rhinorrhea and sinus tenderness present. No nose lacerations, nasal deformity, septal deviation or nasal septal hematoma. No epistaxis.  No foreign bodies. Right sinus exhibits maxillary sinus tenderness and frontal sinus tenderness. Left sinus exhibits maxillary sinus tenderness and frontal sinus tenderness.  Mouth/Throat: Uvula is midline, oropharynx is clear and moist and mucous membranes are normal. Mucous membranes are not pale and not cyanotic. She does not have dentures. No oral lesions. No uvula swelling or lacerations. No oropharyngeal exudate, posterior oropharyngeal edema, posterior oropharyngeal erythema or tonsillar abscesses.  Eyes: Conjunctivae normal, EOM and lids are normal. Pupils are equal, round, and reactive to light. Right eye exhibits no discharge. Left eye exhibits no discharge. Right conjunctiva is not injected. Right conjunctiva has no hemorrhage. Left conjunctiva is not injected. Left conjunctiva has no hemorrhage. No scleral icterus. Right eye exhibits no nystagmus. Left eye exhibits no nystagmus. Right pupil is round and reactive. Left pupil is round and reactive. Pupils are equal.  Neck: Trachea normal, normal range of motion and phonation normal. Neck supple. No JVD present. No spinous process tenderness and no muscular tenderness present. Carotid bruit is not present. No rigidity. No tracheal deviation, no edema, no erythema and normal range of motion  present.  Cardiovascular: Normal rate, regular rhythm, normal heart sounds, intact distal pulses and normal pulses.  Exam reveals no gallop, no friction rub and no decreased pulses.   No murmur heard. Pulmonary/Chest: Effort normal and breath sounds normal. No accessory muscle usage or stridor. No apnea, not tachypneic and not bradypneic. She is not intubated. No respiratory distress. She has no decreased breath sounds. She has no wheezes. She has no rhonchi. She has no rales. She exhibits no tenderness and no retraction.  Abdominal: Soft. Bowel sounds are normal. She exhibits no distension and no mass. There is no tenderness. There is no rebound, no guarding and no CVA tenderness.  Musculoskeletal: Normal range of motion. She exhibits no edema and no tenderness.  Lymphadenopathy:    She has no cervical adenopathy.  Neurological: She is alert and oriented to person, place, and time.  Skin: Skin is warm and dry. No rash noted. She is not diaphoretic. No erythema. No pallor.  Psychiatric: She has a normal mood and affect. Her behavior is normal.    ED Course  Procedures (including critical care time)   Labs Reviewed  RAPID STREP SCREEN   No results found.   No diagnosis found.    MDM  Pt presents for evaluation of sinus pain and pressure.  She has had multiple sick contacts recently and also has throat and ear pain.  Pt is tearful and appears uncomfortable, afebrile, no respiratory insufficiency noted, NAD.  Will obtain a  rapid strep test.  Pt's exam is most consistent with a sinusitis.  Will treat accordingly.   Her history is complicated by reports of chronic allergic rhinitis and the use of afrin spray for greater than 1 year.  She reports rebound sinus pressure and Will prescribe nasal saline, a nasal steroid, bactrim, zyrtec, percocet, and will also refer for evaluation by a local ENT specialist as there appears to be a chronic component to her sinus issues.      Tobin Chad, MD 06/11/12 0830

## 2012-06-11 NOTE — ED Notes (Signed)
Pt has sore throat; cough; nasal congestion.

## 2012-06-29 ENCOUNTER — Ambulatory Visit: Payer: Self-pay | Admitting: Family Medicine

## 2012-07-04 ENCOUNTER — Encounter: Payer: Self-pay | Admitting: Family Medicine

## 2012-07-04 ENCOUNTER — Ambulatory Visit (INDEPENDENT_AMBULATORY_CARE_PROVIDER_SITE_OTHER): Payer: Self-pay | Admitting: Family Medicine

## 2012-07-04 VITALS — BP 149/102 | HR 88 | Temp 98.0°F | Ht 62.5 in | Wt 180.0 lb

## 2012-07-04 DIAGNOSIS — IMO0001 Reserved for inherently not codable concepts without codable children: Secondary | ICD-10-CM | POA: Insufficient documentation

## 2012-07-04 DIAGNOSIS — R030 Elevated blood-pressure reading, without diagnosis of hypertension: Secondary | ICD-10-CM

## 2012-07-04 DIAGNOSIS — T485X5A Adverse effect of other anti-common-cold drugs, initial encounter: Secondary | ICD-10-CM | POA: Insufficient documentation

## 2012-07-04 DIAGNOSIS — J310 Chronic rhinitis: Secondary | ICD-10-CM

## 2012-07-04 DIAGNOSIS — F17200 Nicotine dependence, unspecified, uncomplicated: Secondary | ICD-10-CM

## 2012-07-04 DIAGNOSIS — J329 Chronic sinusitis, unspecified: Secondary | ICD-10-CM | POA: Insufficient documentation

## 2012-07-04 MED ORDER — FLUTICASONE PROPIONATE 50 MCG/ACT NA SUSP: 2.0000 | Freq: Every day | NASAL | Status: DC

## 2012-07-04 MED ORDER — AMOXICILLIN-POT CLAVULANATE 875-125 MG PO TABS: 1.0000 | ORAL_TABLET | Freq: Two times a day (BID) | ORAL | Status: DC

## 2012-07-04 NOTE — Assessment & Plan Note (Signed)
Overuse of afrin.  Has stopped at this point.  Has not picked up flonase 2/2 to cost.  [facility] has flonase for $15 which she says she can afford.

## 2012-07-04 NOTE — Progress Notes (Signed)
  Subjective:    Patient ID: Tamara Barnes, female    DOB: Nov 28, 1965, 46 y.o.   MRN: [id number]  HPI  1.  Sinus Pressure:  C/o R sided sinus and eye pressure.  States that she was seen in ED on 9/29 and diagnosed with sinusitis at that time.  Completed course of bactrim but has not felt much better.  Does endorse tooth pain, purulent nasal drainage, facial pain, headache, eye pressure.  Denies fever, but has felt some chills.  She uses afrin daily for the past 2 years and recently stopped at advice of ED doctor.  Unable to afford flonase.    Review of Systems Denies cough, vision changes, rash, shortness of breath.    Objective:   Physical Exam  Constitutional: She appears well-nourished. No distress.  HENT:  Head: Normocephalic and atraumatic.  Right Ear: Tympanic membrane and ear canal normal.  Left Ear: Tympanic membrane and ear canal normal.  Nose: Mucosal edema present. No rhinorrhea. Right sinus exhibits maxillary sinus tenderness and frontal sinus tenderness. Left sinus exhibits no maxillary sinus tenderness and no frontal sinus tenderness.  Mouth/Throat: Uvula is midline and oropharynx is clear and moist. No dental abscesses. No oropharyngeal exudate.  Eyes: Conjunctivae normal are normal.  Cardiovascular: Normal rate and regular rhythm.   Pulmonary/Chest: Effort normal and breath sounds normal.          Assessment & Plan:

## 2012-07-04 NOTE — Assessment & Plan Note (Signed)
Sinusitis persists despite completing course of bactrim.  Per uptodate "Macrolides (clarithromycin or azithromycin), trimethoprim-sulfamethoxazole, and second- or third-generation cephalosporins are not recommended for empiric therapy because of high rates of resistance of S. pneumoniae (and of H. influenzae for trimethoprim-sulfamethoxazole)."  Will start on augmentin for improved coverage of common pathogens.

## 2012-07-04 NOTE — Assessment & Plan Note (Signed)
BP elevated at todays visit.  I asked patient to return in 1-2 weeks to recheck blood pressure.

## 2012-07-04 NOTE — Patient Instructions (Addendum)
Do not use afrin, start using flonase.  The cheapest place is [facility]  Sinusitis Sinusitis is redness, soreness, and swelling (inflammation) of the paranasal sinuses. Paranasal sinuses are air pockets within the bones of your face (beneath the eyes, the middle of the forehead, or above the eyes). In healthy paranasal sinuses, mucus is able to drain out, and air is able to circulate through them by way of your nose. However, when your paranasal sinuses are inflamed, mucus and air can become trapped. This can allow bacteria and other germs to grow and cause infection. Sinusitis can develop quickly and last only a short time (acute) or continue over a long period (chronic). Sinusitis that lasts for more than 12 weeks is considered chronic.  CAUSES  Causes of sinusitis include:  Allergies.  Structural abnormalities, such as displacement of the cartilage that separates your nostrils (deviated septum), which can decrease the air flow through your nose and sinuses and affect sinus drainage.  Functional abnormalities, such as when the small hairs (cilia) that line your sinuses and help remove mucus do not work properly or are not present. SYMPTOMS  Symptoms of acute and chronic sinusitis are the same. The primary symptoms are pain and pressure around the affected sinuses. Other symptoms include:  Upper toothache.  Earache.  Headache.  Bad breath.  Decreased sense of smell and taste.  A cough, which worsens when you are lying flat.  Fatigue.  Fever.  Thick drainage from your nose, which often is green and may contain pus (purulent).  Swelling and warmth over the affected sinuses. DIAGNOSIS  Your caregiver will perform a physical exam. During the exam, your caregiver may:  Look in your nose for signs of abnormal growths in your nostrils (nasal polyps).  Tap over the affected sinus to check for signs of infection.  View the inside of your sinuses (endoscopy) with a special imaging  device with a light attached (endoscope), which is inserted into your sinuses. If your caregiver suspects that you have chronic sinusitis, one or more of the following tests may be recommended:  Allergy tests.  Nasal culture A sample of mucus is taken from your nose and sent to a lab and screened for bacteria.  Nasal cytology A sample of mucus is taken from your nose and examined by your caregiver to determine if your sinusitis is related to an allergy. TREATMENT  Most cases of acute sinusitis are related to a viral infection and will resolve on their own within 10 days. Sometimes medicines are prescribed to help relieve symptoms (pain medicine, decongestants, nasal steroid sprays, or saline sprays).  However, for sinusitis related to a bacterial infection, your caregiver will prescribe antibiotic medicines. These are medicines that will help kill the bacteria causing the infection.  Rarely, sinusitis is caused by a fungal infection. In theses cases, your caregiver will prescribe antifungal medicine. For some cases of chronic sinusitis, surgery is needed. Generally, these are cases in which sinusitis recurs more than 3 times per year, despite other treatments. HOME CARE INSTRUCTIONS   Drink plenty of water. Water helps thin the mucus so your sinuses can drain more easily.  Use a humidifier.  Inhale steam 3 to 4 times a day (for example, sit in the bathroom with the shower running).  Apply a warm, moist washcloth to your face 3 to 4 times a day, or as directed by your caregiver.  Use saline nasal sprays to help moisten and clean your sinuses.  Take over-the-counter or prescription medicines  for pain, discomfort, or fever only as directed by your caregiver. SEEK IMMEDIATE MEDICAL CARE IF:  You have increasing pain or severe headaches.  You have nausea, vomiting, or drowsiness.  You have swelling around your face.  You have vision problems.  You have a stiff neck.  You have  difficulty breathing. MAKE SURE YOU:   Understand these instructions.  Will watch your condition.  Will get help right away if you are not doing well or get worse. Document Released: 08/30/2005 Document Revised: 11/22/2011 Document Reviewed: 09/14/2011 [facility] Patient Information 2013 [place], [place].

## 2012-07-04 NOTE — Assessment & Plan Note (Signed)
Discussed importance of quitting smoking and role it can play in preventing infections.

## 2015-01-21 ENCOUNTER — Encounter (HOSPITAL_COMMUNITY): Payer: Self-pay | Admitting: Cardiology

## 2015-01-21 ENCOUNTER — Emergency Department (HOSPITAL_COMMUNITY)
Admission: EM | Admit: 2015-01-21 | Discharge: 2015-01-21 | Disposition: A | Discharge status: Home / Self Care | Payer: No Typology Code available for payment source | Attending: Emergency Medicine | Admitting: Emergency Medicine

## 2015-01-21 ENCOUNTER — Emergency Department (HOSPITAL_COMMUNITY): Payer: No Typology Code available for payment source

## 2015-01-21 DIAGNOSIS — Y9301 Activity, walking, marching and hiking: Secondary | ICD-10-CM | POA: Insufficient documentation

## 2015-01-21 DIAGNOSIS — M25562 Pain in left knee: Secondary | ICD-10-CM

## 2015-01-21 DIAGNOSIS — Z720 Tobacco use: Secondary | ICD-10-CM | POA: Insufficient documentation

## 2015-01-21 DIAGNOSIS — W109XXA Fall (on) (from) unspecified stairs and steps, initial encounter: Secondary | ICD-10-CM | POA: Diagnosis not present

## 2015-01-21 DIAGNOSIS — Z7951 Long term (current) use of inhaled steroids: Secondary | ICD-10-CM | POA: Insufficient documentation

## 2015-01-21 DIAGNOSIS — Y998 Other external cause status: Secondary | ICD-10-CM | POA: Insufficient documentation

## 2015-01-21 DIAGNOSIS — Y929 Unspecified place or not applicable: Secondary | ICD-10-CM | POA: Insufficient documentation

## 2015-01-21 DIAGNOSIS — S8992XA Unspecified injury of left lower leg, initial encounter: Secondary | ICD-10-CM | POA: Insufficient documentation

## 2015-01-21 DIAGNOSIS — Z79899 Other long term (current) drug therapy: Secondary | ICD-10-CM | POA: Diagnosis not present

## 2015-01-21 MED ORDER — NAPROXEN 500 MG PO TABS: 500.0000 mg | ORAL_TABLET | Freq: Two times a day (BID) | ORAL | Status: DC

## 2015-01-21 MED ORDER — HYDROCODONE-ACETAMINOPHEN 5-325 MG PO TABS: 1.0000 | ORAL_TABLET | Freq: Four times a day (QID) | ORAL | Status: DC | PRN

## 2015-01-21 MED ORDER — HYDROCODONE-ACETAMINOPHEN 5-325 MG PO TABS
2.0000 | ORAL_TABLET | Freq: Once | ORAL | Status: AC
  Administered 2015-01-21: 2 via ORAL
  Filled 2015-01-21: qty 2

## 2015-01-21 NOTE — ED Provider Notes (Signed)
CSN: [id number]     Arrival date & time 01/21/15  1709 History  This chart was scribed for non-physician practitioner, Abigail Butts, PA-C working with Milton Ferguson, MD by Pricilla Loveless, ED scribe. This patient was seen in room TR09C/TR09C and the patient's care was started at 5:30 PM    Chief Complaint  Patient presents with  . Knee Pain     The history is provided by the patient. No language interpreter was used.    HPI Comments: Tamara Barnes is a 49 y.o. female who presents to the Emergency Department complaining of mild, constant, burning left knee pain that began 2 days ago after a fall walking up the stairs.The patient reports swelling as an associated symptom. The patient reports that the pain becomes worse while walking, flexing, or extending the knee. She states that the pain extends from above the left knee radiating to above the left ankle during flexion and extension, but otherwise is localized to the knee itself. The patient is currently taking tylenol with no relief to her pain. She denies numbness or weakness to the left foot as associated symptoms.  History reviewed. No pertinent past medical history. Past Surgical History  Procedure Laterality Date  . Tuabl ligation     History reviewed. No pertinent family history. History  Substance Use Topics  . Smoking status: Current Every Day Smoker  . Smokeless tobacco: Not on file  . Alcohol Use: Yes   OB History    No data available     Review of Systems  Constitutional: Negative for fever and chills.  Gastrointestinal: Negative for nausea and vomiting.  Musculoskeletal: Positive for joint swelling and arthralgias. Negative for back pain, neck pain and neck stiffness.  Skin: Negative for wound.  Neurological: Negative for numbness.  Hematological: Does not bruise/bleed easily.  Psychiatric/Behavioral: The patient is not nervous/anxious.   All other systems reviewed and are negative.     Allergies   Review of patient's allergies indicates no known allergies.  Home Medications   Prior to Admission medications   Medication Sig Start Date End Date Taking? Authorizing Provider  acetaminophen (TYLENOL) 500 MG tablet Take 1,500 mg by mouth every 6 (six) hours as needed. For pain    Historical Provider, MD  amoxicillin-clavulanate (AUGMENTIN) 875-125 MG per tablet Take 1 tablet by mouth 2 (two) times daily. 07/04/12   Luetta Nutting, DO  cetirizine (ZYRTEC ALLERGY) 10 MG tablet Take 1 tablet (10 mg total) by mouth daily. 06/11/12   Perlie Mayo, MD  fluticasone (FLONASE) 50 MCG/ACT nasal spray Place 2 sprays into the nose daily. 07/04/12   Luetta Nutting, DO  HYDROcodone-acetaminophen (NORCO/VICODIN) 5-325 MG per tablet Take 1 tablet by mouth every 6 (six) hours as needed for moderate pain or severe pain. 01/21/15   Maurizio Geno, PA-C  naproxen (NAPROSYN) 500 MG tablet Take 1 tablet (500 mg total) by mouth 2 (two) times daily with a meal. 01/21/15   Jadore Mcguffin, PA-C  oxyCODONE-acetaminophen (PERCOCET) 5-325 MG per tablet Take 1 tablet by mouth every 6 (six) hours as needed for pain. 06/11/12   Perlie Mayo, MD  sodium chloride (OCEAN) 0.65 % nasal spray Place 1 spray into the nose as needed for congestion. 06/11/12   Perlie Mayo, MD   BP 143/92 mmHg  Pulse 99  Temp(Src) 97.8 F (36.6 C) (Oral)  Resp 18  Ht 5\' 3"  (1.6 m)  Wt 191 lb (86.637 kg)  BMI 33.84 kg/m2  SpO2 96%  LMP  01/14/2015 Physical Exam  Constitutional: She appears well-developed and well-nourished. No distress.  HENT:  Head: Normocephalic and atraumatic.  Eyes: Conjunctivae are normal.  Neck: Normal range of motion.  Cardiovascular: Normal rate, regular rhythm, normal heart sounds and intact distal pulses.   No murmur heard. Capillary refill < 3 sec  Pulmonary/Chest: Effort normal and breath sounds normal.  Musculoskeletal: She exhibits tenderness. She exhibits no edema.  ROM: Slightly decreased ROM  of the left knee due to pain.  FROM of the left hip, ankle and all toes.  Small abrasion to the left knee.  Pt without foot drop and no palpable defect to the patella tendon  Neurological: She is alert. Coordination normal.  Sensation intact to dull and sharp in the LLE Strength 5/5 in LLE; 3/5 with resisted flexion and extension of the left knee due to pain; 5/5 dorsiflexion and plantar flexion of the LLE  Skin: Skin is warm and dry. She is not diaphoretic. No erythema.  No tenting of the skin  Psychiatric: She has a normal mood and affect.  Nursing note and vitals reviewed.   ED Course  Procedures (including critical care time) DIAGNOSTIC STUDIES: Oxygen Saturation is 96% on RA, Normal by my interpretation.    COORDINATION OF CARE: 5:38 PM Discussed treatment plan at bedside including X-ray of her left knee and hydrocodone. Pt agreed to plan.   Labs Review Labs Reviewed - No data to display  Imaging Review Dg Knee Complete 4 Views Left  01/21/2015   CLINICAL DATA:  Fall with knee pain.  EXAM: LEFT KNEE - COMPLETE 4+ VIEW  COMPARISON:  09/17/2008  FINDINGS: There is no evidence of fracture, dislocation, or joint effusion.  No joint narrowing.  IMPRESSION: Negative.   Electronically Signed   By: Monte Fantasia M.D.   On: 01/21/2015 18:00     EKG Interpretation None      MDM   Final diagnoses:  Arthralgia of left knee    [name] presents with left knee pain after fall. No significant joint effusion to warrant therapeutic arthrocentesis.    6:03 PM Patient X-Ray negative for obvious fracture or dislocation. Pain managed in ED. Pt advised to follow up with orthopedics if symptoms persist for further evaluation and treatment. Patient given brace while in ED, conservative therapy recommended and discussed. Patient will be dc home & is agreeable with above plan.  BP 143/92 mmHg  Pulse 99  Temp(Src) 97.8 F (36.6 C) (Oral)  Resp 18  Ht 5\' 3"  (1.6 m)  Wt 191 lb  (86.637 kg)  BMI 33.84 kg/m2  SpO2 96%  LMP 01/14/2015  I personally performed the services described in this documentation, which was scribed in my presence. The recorded information has been reviewed and is accurate.   Jarrett Soho Javaeh Muscatello, PA-C 01/21/15 1807  Milton Ferguson, MD 01/22/15 [phone]

## 2015-01-21 NOTE — Discharge Instructions (Signed)
1. Medications: alternate naprosyn and tylenol for pain control, use vicodin only for severe pain; usual home medications 2. Treatment: rest, ice, elevate and use brace, drink plenty of fluids, gentle stretching 3. Follow Up: Please followup with orthopedics as directed or your PCP in 1 week if no improvement for discussion of your diagnoses and further evaluation after today's visit; if you do not have a primary care doctor use the resource guide provided to find one; Please return to the ER for worsening symptoms or other concerns   Arthralgia Your caregiver has diagnosed you as suffering from an arthralgia. Arthralgia means there is pain in a joint. This can come from many reasons including:  Bruising the joint which causes soreness (inflammation) in the joint.  Wear and tear on the joints which occur as we grow older (osteoarthritis).  Overusing the joint.  Various forms of arthritis.  Infections of the joint. Regardless of the cause of pain in your joint, most of these different pains respond to anti-inflammatory drugs and rest. The exception to this is when a joint is infected, and these cases are treated with antibiotics, if it is a bacterial infection. HOME CARE INSTRUCTIONS   Rest the injured area for as long as directed by your caregiver. Then slowly start using the joint as directed by your caregiver and as the pain allows. Crutches as directed may be useful if the ankles, knees or hips are involved. If the knee was splinted or casted, continue use and care as directed. If an stretchy or elastic wrapping bandage has been applied today, it should be removed and re-applied every 3 to 4 hours. It should not be applied tightly, but firmly enough to keep swelling down. Watch toes and feet for swelling, bluish discoloration, coldness, numbness or excessive pain. If any of these problems (symptoms) occur, remove the ace bandage and re-apply more loosely. If these symptoms persist, contact  your caregiver or return to this location.  For the first 24 hours, keep the injured extremity elevated on pillows while lying down.  Apply ice for 15-20 minutes to the sore joint every couple hours while awake for the first half day. Then 03-04 times per day for the first 48 hours. Put the ice in a plastic bag and place a towel between the bag of ice and your skin.  Wear any splinting, casting, elastic bandage applications, or slings as instructed.  Only take over-the-counter or prescription medicines for pain, discomfort, or fever as directed by your caregiver. Do not use aspirin immediately after the injury unless instructed by your physician. Aspirin can cause increased bleeding and bruising of the tissues.  If you were given crutches, continue to use them as instructed and do not resume weight bearing on the sore joint until instructed. Persistent pain and inability to use the sore joint as directed for more than 2 to 3 days are warning signs indicating that you should see a caregiver for a follow-up visit as soon as possible. Initially, a hairline fracture (break in bone) may not be evident on X-rays. Persistent pain and swelling indicate that further evaluation, non-weight bearing or use of the joint (use of crutches or slings as instructed), or further X-rays are indicated. X-rays may sometimes not show a small fracture until a week or 10 days later. Make a follow-up appointment with your own caregiver or one to whom we have referred you. A radiologist (specialist in reading X-rays) may read your X-rays. Make sure you know how you are  to obtain your X-ray results. Do not assume everything is normal if you do not hear from [place]. SEEK MEDICAL CARE IF: Bruising, swelling, or pain increases. SEEK IMMEDIATE MEDICAL CARE IF:   Your fingers or toes are numb or blue.  The pain is not responding to medications and continues to stay the same or get worse.  The pain in your joint becomes  severe.  You develop a fever over 102 F (38.9 C).  It becomes impossible to move or use the joint. MAKE SURE YOU:   Understand these instructions.  Will watch your condition.  Will get help right away if you are not doing well or get worse. Document Released: 08/30/2005 Document Revised: 11/22/2011 Document Reviewed: 04/17/2008 [facility] Patient Information 2015 [place], [place]. This information is not intended to replace advice given to you by your health care provider. Make sure you discuss any questions you have with your health care provider.    Cryotherapy Cryotherapy means treatment with cold. Ice or gel packs can be used to reduce both pain and swelling. Ice is the most helpful within the first 24 to 48 hours after an injury or flare-up from overusing a muscle or joint. Sprains, strains, spasms, burning pain, shooting pain, and aches can all be eased with ice. Ice can also be used when recovering from surgery. Ice is effective, has very few side effects, and is safe for most people to use. PRECAUTIONS  Ice is not a safe treatment option for people with:  Raynaud phenomenon. This is a condition affecting small blood vessels in the extremities. Exposure to cold may cause your problems to return.  Cold hypersensitivity. There are many forms of cold hypersensitivity, including:  Cold urticaria. Red, itchy hives appear on the skin when the tissues begin to warm after being iced.  Cold erythema. This is a red, itchy rash caused by exposure to cold.  Cold hemoglobinuria. Red blood cells break down when the tissues begin to warm after being iced. The hemoglobin that carry oxygen are passed into the urine because they cannot combine with blood proteins fast enough.  Numbness or altered sensitivity in the area being iced. If you have any of the following conditions, do not use ice until you have discussed cryotherapy with your caregiver:  Heart conditions, such as arrhythmia, angina,  or chronic heart disease.  High blood pressure.  Healing wounds or open skin in the area being iced.  Current infections.  Rheumatoid arthritis.  Poor circulation.  Diabetes. Ice slows the blood flow in the region it is applied. This is beneficial when trying to stop inflamed tissues from spreading irritating chemicals to surrounding tissues. However, if you expose your skin to cold temperatures for too long or without the proper protection, you can damage your skin or nerves. Watch for signs of skin damage due to cold. HOME CARE INSTRUCTIONS Follow these tips to use ice and cold packs safely.  Place a dry or damp towel between the ice and skin. A damp towel will cool the skin more quickly, so you may need to shorten the time that the ice is used.  For a more rapid response, add gentle compression to the ice.  Ice for no more than 10 to 20 minutes at a time. The bonier the area you are icing, the less time it will take to get the benefits of ice.  Check your skin after 5 minutes to make sure there are no signs of a poor response to cold or  skin damage.  Rest 20 minutes or more between uses.  Once your skin is numb, you can end your treatment. You can test numbness by very lightly touching your skin. The touch should be so light that you do not see the skin dimple from the pressure of your fingertip. When using ice, most people will feel these normal sensations in this order: cold, burning, aching, and numbness.  Do not use ice on someone who cannot communicate their responses to pain, such as small children or people with dementia. HOW TO MAKE AN ICE PACK Ice packs are the most common way to use ice therapy. Other methods include ice massage, ice baths, and cryosprays. Muscle creams that cause a cold, tingly feeling do not offer the same benefits that ice offers and should not be used as a substitute unless recommended by your caregiver. To make an ice pack, do one of the  following:  Place crushed ice or a bag of frozen vegetables in a sealable plastic bag. Squeeze out the excess air. Place this bag inside another plastic bag. Slide the bag into a pillowcase or place a damp towel between your skin and the bag.  Mix 3 parts water with 1 part rubbing alcohol. Freeze the mixture in a sealable plastic bag. When you remove the mixture from the freezer, it will be slushy. Squeeze out the excess air. Place this bag inside another plastic bag. Slide the bag into a pillowcase or place a damp towel between your skin and the bag. SEEK MEDICAL CARE IF:  You develop white spots on your skin. This may give the skin a blotchy (mottled) appearance.  Your skin turns blue or pale.  Your skin becomes waxy or hard.  Your swelling gets worse. MAKE SURE YOU:   Understand these instructions.  Will watch your condition.  Will get help right away if you are not doing well or get worse. Document Released: 04/26/2011 Document Revised: 01/14/2014 Document Reviewed: 04/26/2011 [facility] Patient Information 2015 [place], [place]. This information is not intended to replace advice given to you by your health care provider. Make sure you discuss any questions you have with your health care provider. . Emergency Department Resource Guide 1) Find a Doctor and Pay Out of Pocket Although you won't have to find out who is covered by your insurance plan, it is a good idea to ask around and get recommendations. You will then need to call the office and see if the doctor you have chosen will accept you as a new patient and what types of options they offer for patients who are self-pay. Some doctors offer discounts or will set up payment plans for their patients who do not have insurance, but you will need to ask so you aren't surprised when you get to your appointment.  2) Contact Your Local Health Department Not all health departments have doctors that can see patients for sick visits, but many  do, so it is worth a call to see if yours does. If you don't know where your local health department is, you can check in your phone book. The CDC also has a tool to help you locate your state's health department, and many state websites also have listings of all of their local health departments.  3) Find a [facility] If your illness is not likely to be very severe or complicated, you may want to try a walk in clinic. These are popping up all over the country in pharmacies, drugstores, and  shopping centers. They're usually staffed by nurse practitioners or physician assistants that have been trained to treat common illnesses and complaints. They're usually fairly quick and inexpensive. However, if you have serious medical issues or chronic medical problems, these are probably not your best option.  No Primary Care Doctor: - Call Health Connect at  [phone] - they can help you locate a primary care doctor that  accepts your insurance, provides certain services, etc. - Physician Referral Service- [phone]  Chronic Pain Problems: Organization         Address  Phone   Notes  [facility]  [phone] Patients need to be referred by their primary care doctor.   Medication Assistance: Organization         Address  Phone   Notes  [facility] Medication [facility] [address] [place], [place] [address] [phone] --Must be a resident of [facility] -- Must have NO insurance coverage whatsoever (no Medicaid/ Medicare, etc.) -- The pt. MUST have a primary care doctor that directs their care regularly and follows them in the community   MedAssist  [phone]   [employer]  [phone]    Agencies that provide inexpensive medical care: Organization         Address  Phone   Notes  [place]  [phone]   [facility]    [phone]   [facility] [place], [place] [address] [phone]   [place] [address] [place] [phone]   Planned Parenthood    [phone]   [facility]    [phone]   [place] [address] [place] [address], [place] Phone:  [phone], Fax:  [phone] Hours of Operation:  9 am - 6 pm, M-F.  Also accepts Medicaid/Medicare and self-pay.  [facility] for [place], [address], [place] Phone: [phone], Fax: [phone]. Hours of Operation:  8:30 am - 5:30 pm, M-F.  Also accepts Medicaid and self-pay.  [facility] [address] [place] Phone: [phone]   [place], [place], [place] [phone], Ext. [phone] Mondays & Thursdays: 7-9 AM.  First 15 patients are seen on a first come, first serve basis.    [place] Providers:  Organization         Address  Phone   Notes  [facility] [address], [place] [phone] Also accepts self-pay patients.  [facility] [address], [place]  [phone]   [place], [address], [place] [phone]   [facility] : [facility] [address], [place] [phone]   Lucianne Lei [address], [place]   [phone] Only accepts [place] Access [place] patients after they have their name applied to their card.   Self-Pay (no insurance) in [facility]:  Organization         Address  Phone   Notes  Sickle Cell Patients, [facility] [place] [phone]   [facility] [place] [phone]   [facility]gent [place]  [place] [address] [place], [place] [phone]  [facility]/Dr. Osei-Bonsu  [address] [place] or  [address], [place] [phone] Phone number for both [place] and [place] locations is the same.  [facility]gent [facility] and [facility] [address] [place] [phone]   [facility] [address], [place] or [address] [phone]   [facility] [address] [place] [phone], phone; [phone], fax Sees patients 1st and 3rd Saturday of every month.  Must not qualify for public or private insurance (i.e. Medicaid, Medicare, [place] Health Choice, Veterans' Benefits)  Household income should be no more than 200% of the poverty level The clinic cannot treat you if you are pregnant or think you are pregnant  Sexually transmitted diseases are not treated at the clinic.    Dental Care: Organization         Address  Phone  Notes  [facility] Department of [facility] [phone] Accepts children up to age 54 who are enrolled in [place] or [place]; pregnant women with a Medicaid card; and children who have applied for Medicaid or [place] Health Choice, but were declined, whose parents can pay a reduced fee at time of service.  [facility] Department of [facility]  [address], [place] [phone] Accepts children up to age 1 who are enrolled in [place] or [place]; pregnant women with a Medicaid card; and children who have applied for Medicaid or [place] Health Choice, but were declined, whose parents can pay a reduced fee at time of service.  [facility] Adult Dental Access PROGRAM  [place] [phone] Patients are seen by appointment only. Walk-ins are not accepted. [place] will see patients 60 years of age and older. Monday - Tuesday (8am-5pm) Most Wednesdays (8:30-5pm) $30 per visit, cash only  [facility] Adult Dental Access PROGRAM  [address], [facility] [place] [phone] Patients are seen by appointment only. Walk-ins are not accepted. [place] will see patients 31 years of age and older. One Wednesday Evening (Monthly: Volunteer Based).  $30 per visit, cash only  [place]  [phone] for adults; Children under age 28, call Graduate Pediatric Dentistry at [phone]. Children aged 27-14, please call [phone] to request a pediatric application.  Dental services are provided in all areas of dental care including fillings, crowns and bridges, complete and partial dentures, implants, gum treatment, root canals, and extractions. Preventive care is also provided. Treatment is provided to both adults and children. Patients are selected via a lottery and there is often a waiting list.   [facility] [address], [place]  [phone] [email or web address]   [facility] [address] [place], [place] [phone], Ext. [phone] Second and Fourth Thursday of each month, opens at 6:30 AM; Clinic ends at 9 AM.  Patients are seen on a first-come first-served basis, and a limited number are seen during each clinic.   [facility]  [address] [place], [place] [phone]   Eligibility Requirements You must have lived in [place], [place], or [place] counties for at least the last three months.   You cannot be eligible for state or federal sponsored [employer], including [employer], [place], or [employer].   You generally cannot be eligible for healthcare insurance  through your employer.    How to apply: Eligibility screenings are held every Tuesday and Wednesday afternoon from 1:00 pm until 4:00 pm. You do not need an appointment for the interview!  [facility] [address] [place], [place]   [place]  [place] Department  [place]  [phone]    Behavioral Health Resources in the Community: Intensive Outpatient Programs Organization         Address  Phone  Notes  [place], [place], [place] [phone]   [facility] Outpatient [address] [place], [place]   ADS: Alcohol & Drug S[facility]cs [address] [place], [place]   [place] [address]  [place], [place] or [phone]   Substance Abuse Resources Organization         Address  Phone  Notes  Alcohol and Drug Services  [phone]   [place]  [phone]   [facility] [place]   Chinita Pester  [phone]   Residential & Outpatient Substance Abuse Program  [phone]   Psychological Services Organization         Address  Phone  Notes  [facility] [place]  [place]  [phone]   [place] [address], [place] or [phone]    Mobile Crisis Teams Organization         Address  Phone  Notes  [facility], Mobile Crisis Care Unit  [phone]   [facility]  [address] [place], [place]   [place] [address], [place] [phone]    Self-Help/Support Groups Organization         Address  Phone             Notes  [place]. of [place] - variety of support groups  [place] Call for more information  Narcotics Anonymous (NA), [facility] [address], [employer] [place]  2 meetings at this location   Special educational needs teacher         Address  Phone  Notes  [facility] Residential Treatment [place],    [place]  [phone]   [facility]  [address] [place] [address], [place], [place]   [place], [place] [phone]  Admissions: 8am-3pm M-F  Incentives Substance [place] [address],    [place], [place] [phone]   [facility] [address] [place], [place], [place]   [facility] [address],  [place], [place]   [facility] - Intensive Outpatient [address], [place], [place]   [facility] [address]  [place], [place] [phone] or [phone]   [facility] Services (RTS) [address], [place], [place] Accepts Medicaid  [facility] [place] [address],  [place] [phone] Substance Abuse/Addiction Treatment   [facility] Organization         Address  Phone  Notes  [facility]  [phone]   Domenic Schwab, PhD [address] [place], [place]   [phone] or [phone]   [place], [place] [phone]  [facility] [address], [place], [place] [phone] Insurance/Medicaid/sponsorship through [employer] and Families [address] [place], [place] [phone] [place], [place] [phone]    Dr. Adele Schilder  [phone]   [facility] Dept. 1) [address], [place] 2) [place] 3)  [place] [address], [place] [phone]  [phone]   [place] [phone] or [phone] (After Hours)

## 2015-01-21 NOTE — ED Notes (Signed)
Pt denies any CP, confusion, weakness, or dizziness that led to the fall.  Sts she was playing with her grandson when the fall occurred.

## 2015-01-21 NOTE — ED Notes (Signed)
Pt reports that she fell on mothers and hit her left knee on some stairs. Pt reports pain and swelling

## 2015-08-05 ENCOUNTER — Encounter (HOSPITAL_COMMUNITY): Payer: Self-pay | Admitting: Emergency Medicine

## 2015-08-05 ENCOUNTER — Emergency Department (HOSPITAL_COMMUNITY)
Admission: EM | Admit: 2015-08-05 | Discharge: 2015-08-05 | Disposition: A | Discharge status: Home / Self Care | Payer: No Typology Code available for payment source | Attending: Emergency Medicine | Admitting: Emergency Medicine

## 2015-08-05 ENCOUNTER — Telehealth: Payer: Self-pay | Admitting: *Deleted

## 2015-08-05 DIAGNOSIS — K0889 Other specified disorders of teeth and supporting structures: Secondary | ICD-10-CM | POA: Insufficient documentation

## 2015-08-05 DIAGNOSIS — K0501 Acute gingivitis, non-plaque induced: Secondary | ICD-10-CM | POA: Insufficient documentation

## 2015-08-05 DIAGNOSIS — Z792 Long term (current) use of antibiotics: Secondary | ICD-10-CM | POA: Insufficient documentation

## 2015-08-05 DIAGNOSIS — Z791 Long term (current) use of non-steroidal anti-inflammatories (NSAID): Secondary | ICD-10-CM | POA: Insufficient documentation

## 2015-08-05 DIAGNOSIS — K1379 Other lesions of oral mucosa: Secondary | ICD-10-CM | POA: Insufficient documentation

## 2015-08-05 DIAGNOSIS — J3489 Other specified disorders of nose and nasal sinuses: Secondary | ICD-10-CM | POA: Insufficient documentation

## 2015-08-05 DIAGNOSIS — K029 Dental caries, unspecified: Secondary | ICD-10-CM | POA: Insufficient documentation

## 2015-08-05 DIAGNOSIS — K068 Other specified disorders of gingiva and edentulous alveolar ridge: Secondary | ICD-10-CM

## 2015-08-05 DIAGNOSIS — F17210 Nicotine dependence, cigarettes, uncomplicated: Secondary | ICD-10-CM | POA: Insufficient documentation

## 2015-08-05 DIAGNOSIS — K0510 Chronic gingivitis, plaque induced: Secondary | ICD-10-CM

## 2015-08-05 DIAGNOSIS — Z79899 Other long term (current) drug therapy: Secondary | ICD-10-CM | POA: Insufficient documentation

## 2015-08-05 DIAGNOSIS — Z7951 Long term (current) use of inhaled steroids: Secondary | ICD-10-CM | POA: Insufficient documentation

## 2015-08-05 MED ORDER — DOXYCYCLINE HYCLATE 100 MG PO CAPS: 100.0000 mg | ORAL_CAPSULE | Freq: Two times a day (BID) | ORAL | Status: DC

## 2015-08-05 MED ORDER — MAGIC MOUTHWASH W/LIDOCAINE: 10.0000 mL | Freq: Three times a day (TID) | ORAL | Status: DC | PRN

## 2015-08-05 NOTE — Discharge Instructions (Signed)
Apply warm compresses to jaw throughout the day. Take antibiotic until finished. Use magic mouthwash as directed, as needed for pain. Followup with a dentist is very important for ongoing evaluation and management of recurrent dental pain. Return to emergency department for emergent changing or worsening symptoms.   Gingivitis Gingivitis is a form of gum (periodontal) disease that causes redness, soreness, and swelling (inflammation) of your gums. CAUSES The most common cause of gingivitis is poor oral hygiene. A sticky substance made of bacteria, mucus, and food particles (plaque), is deposited on the exposed part of teeth. As plaque builds up, it reacts with the saliva in your mouth to form something called  tartar. Tartar is a hard deposit that becomes trapped around the base of the tooth. Plaque and tartar irritate the gums, leading to the formation of gingivitis. Other factors that increase your risk for gingivitis include:   Tobacco use.  Diabetes.  Older age.  Certain medications.  Certain viral or fungal infections.  Dry mouth.  Hormonal changes such as during pregnancy.  Poor nutrition.  Substance abuse.  Poor fitting dental restorations or appliances. SYMPTOMS You may notice inflammation of the soft tissue (gingiva) around the teeth. When these tissues become inflamed, they bleed easily, especially during flossing or brushing. The gums may also be:   Tender to the touch.  Bright red, purple red, or have a shiny appearance.  Swollen.  Wearing away from the teeth (receding), which exposes more of the tooth. Bad breath is often present. Continued infection around teeth can eventually cause cavities and loosen teeth. This may lead to eventual tooth loss. DIAGNOSIS A medical and dental history will be taken. Your mouth, teeth, and gums will be examined. Your dentist will look for soft, swollen purple-red, irritated gums. There may be deposits of plaque and tartar at the  base of the teeth. Your gums will be looked at for the degree of redness, puffiness, and bleeding tendencies. Your dentist will see if any of the teeth are loose. X-rays may be taken to see if the inflammation has spread to the supporting structures of the teeth. TREATMENT The goal is to reduce and reverse the inflammation. Proper treatment can usually reverse the symptoms of gingivitis and prevent further progression of the disease. Have your teeth cleaned. During the cleaning, all plaque and tartar will be removed. Instruction for proper home care will be given. You will need regular professional cleanings and check-ups in the future. HOME CARE INSTRUCTIONS  Brush your teeth twice a day and floss at least once per day. When flossing, it is best to floss first then brush.  Limit sugar between meals and maintain a well-balanced diet.  Even the best dental hygiene will not prevent plaque from developing. It is necessary for you to see your dentist on a regular basis for cleaning and regular checkups.  Your dentist can recommend proper oral hygiene and mouth care and suggest special toothpastes or mouth rinses.  Stop smoking. SEEK DENTAL OR MEDICAL CARE IF:  You have painful, reddened tissue around your teeth, or you have puffy swollen gums.  You have difficulty chewing.  You notice any loose or infected teeth.  You have swollen glands.  Your gums bleed easily when you brush your teeth or are very tender to the touch.   This information is not intended to replace advice given to you by your health care provider. Make sure you discuss any questions you have with your health care provider.   Document  Released: 02/23/2001 Document Revised: 11/22/2011 Document Reviewed: 04/14/2015 Elsevier Interactive Patient Education 2016 [place], Adult Preventive dental care includes seeing a dentist regularly and practicing good dental care (oral hygiene) at home. These  actions can help to prevent cavities and other tooth problems, root canal problems, gum disease (gingivitis), and tooth loss. Regular dental exams may also help your health care provider to diagnose other medical problems. Many diseases, including mouth cancers, have early signs that can be found during a preventive dental care visit. WHAT CAN I EXPECT DURING MY DENTAL VISITS? Many adults see their dentist one or two times each year for oral exams and cleanings. Talk with your dentist about the best preventive dental care schedule for you. At your visit, your dentist may ask you about:  Your overall health and diet.  Any new symptoms, such as:  Bleeding gums.  Mouth, tooth, or jaw pain. Your dentist will do a mouth (oral) exam to check for:  Cavities.  Gingivitis or other problems.  Signs of cancer.  Neck swelling or lumps.  Abnormal jaw movement or pain in the jaw joint. You may also have:  Dental X-rays.  Your teeth cleaned. If you have an early problem, like a cavity, your dentist will schedule time for you to get treatment. If you have a tooth root problem, gum disease, or a sign of another disease, your dentist may send you to see another health care provider for care. HOW CAN I CARE FOR MY TEETH AT HOME?  Brush with an approved fluoride toothpaste every morning and night. If possible, brush within 10 minutes after every meal.  Floss one time every day.  Periodically check your teeth for white or brown spots after brushing. These may be signs of cavities.  Check your gums for swelling or bleeding. These may be signs of gum disease, such as gingivitis or periodontitis.  Make sure your diet includes plenty of fruits, vegetables, milk and dairy products, whole grains, and proteins. Do not eat a lot of starchy foods or foods with added sugar. Talk with your health care provider if you have questions about following a healthy diet.  Avoid sodas, sugary snacks, and sticky  candies.  Do not smoke.  Do not get mouth piercings.  If you have tooth or gum pain, gargle with a salt-water mixture 3-4 times per day or as needed. To make a salt-water mixture, completely dissolve -1 tsp of salt in 1 cup of warm water.  Take over-the counter and prescription medicines only as told by your dentist.  If you have a permanent tooth knocked out:  Find the tooth.  Pick it up by the top (crown) with a tissue or gauze.  Wash the tooth for no more than 10 seconds under cold, running water.  Try to put the tooth back into the gum socket.  Put the tooth in a glass of milk if you cannot get it back in place.  Go to your dentist right away. Take the tooth with you. WHEN SHOULD I SEEK MEDICAL CARE? Call your dentist if you have:  Gum, tooth, or jaw pain.  Red, swollen, or bleeding gums.  A tooth or teeth that are very sensitive to hot or cold.  Very bad breath.  A problem with a filling, crown, implant, or denture.  A broken or loose tooth.  A growth or sore in your mouth that is not going away. FOR MORE INFORMATION American Dental Association: http://clayton-rivera.info/  This information is not intended to replace advice given to you by your health care provider. Make sure you discuss any questions you have with your health care provider.   Document Released: 05/21/2015 Document Reviewed: 02/11/2015 Elsevier Interactive Patient Education [employer].    Emergency Department Resource Guide 1) Find a Doctor and Pay Out of Pocket Although you won't have to find out who is covered by your insurance plan, it is a good idea to ask around and get recommendations. You will then need to call the office and see if the doctor you have chosen will accept you as a new patient and what types of options they offer for patients who are self-pay. Some doctors offer discounts or will set up payment plans for their patients who do not have insurance, but you will need to ask so you  aren't surprised when you get to your appointment.  2) Contact Your Local Health Department Not all health departments have doctors that can see patients for sick visits, but many do, so it is worth a call to see if yours does. If you don't know where your local health department is, you can check in your phone book. The CDC also has a tool to help you locate your state's health department, and many state websites also have listings of all of their local health departments.  3) Find a [facility] If your illness is not likely to be very severe or complicated, you may want to try a walk in clinic. These are popping up all over the country in pharmacies, drugstores, and shopping centers. They're usually staffed by nurse practitioners or physician assistants that have been trained to treat common illnesses and complaints. They're usually fairly quick and inexpensive. However, if you have serious medical issues or chronic medical problems, these are probably not your best option.  No Primary Care Doctor: - Call Health Connect at  [phone] - they can help you locate a primary care doctor that  accepts your insurance, provides certain services, etc. - Physician Referral Service- [phone]  Chronic Pain Problems: Organization         Address  Phone   Notes  [facility]  [phone] Patients need to be referred by their primary care doctor.   Medication Assistance: Organization         Address  Phone   Notes  [facility] Medication [facility] [address] [place], [place] [address] [phone] --Must be a resident of [facility] -- Must have NO insurance coverage whatsoever (no Medicaid/ Medicare, etc.) -- The pt. MUST have a primary care doctor that directs their care regularly and follows them in the community   [facility]  [phone]   [place]  [phone]     Dental Care: Organization          Address  Phone  Notes  [facility] Department of [facility] [place] [phone] Accepts children up to age 15 who are enrolled in [place] or [place]; pregnant women with a Medicaid card; and children who have applied for Medicaid or [place] Health Choice, but were declined, whose parents can pay a reduced fee at time of service.  [facility] Department of [facility]  [address], [place] [phone] Accepts children up to age 71 who are enrolled in [place] or [place]; pregnant women with a  Medicaid card; and children who have applied for Medicaid or [place] Health Choice, but were declined, whose parents can pay a reduced fee at time of service.  [place] [facility]  [place] [phone] Patients are seen by appointment only. Walk-ins are not accepted. [place] will see patients 12 years of age and older. Monday - Tuesday (8am-5pm) Most Wednesdays (8:30-5pm) $30 per visit, cash only  [facility] Adult [facility]  [address], [facility] [phone] Patients are seen by appointment only. Walk-ins are not accepted. [place] will see patients 73 years of age and older. One Wednesday Evening (Monthly: Volunteer Based).  $30 per visit, cash only  [place]  [phone] for adults; Children under age 32, call Graduate Pediatric Dentistry at [phone]. Children aged 55-14, please call [phone] to request a pediatric application.  Dental services are provided in all areas of dental care including fillings, crowns and bridges, complete and partial dentures, implants, gum treatment, root canals, and extractions. Preventive care is also provided. Treatment is provided to both adults and children. Patients are selected via a lottery and there is often a waiting list.   [facility] [address] [place]  [phone] [email or web address]   [facility] [address] [place], [place] [phone], Ext. [phone] Second and Fourth Thursday of each month, opens at 6:30 AM; Clinic ends at 9 AM.  Patients are seen on a first-come first-served basis, and a limited number are seen during each clinic.   [facility]  [address] [place]er [place], [place] [phone]   Eligibility Requirements You must have lived in [place], [place], or [place] counties for at least the last three months.   You cannot be eligible for state or federal sponsored [employer], including [employer], [place], or [employer].   You generally cannot be eligible for healthcare insurance through your employer.    How to apply: Eligibility screenings are held every Tuesday and Wednesday afternoon from 1:00 pm until 4:00 pm. You do not need an appointment for the interview!  [facility] [address], [place], [place]   [place]  [place]  [place]  [phone]
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
## 2015-08-05 NOTE — Telephone Encounter (Signed)
Pharmacy called for [employer].  Magic Mouthwash:  165ml Maalox Extra Strength; 93ml Nystatin Suspension; 3107ml Benedryl; If with lidocaine: 144ml Magic mouthwash solution and 62ml 2% Lidocaine.

## 2016-06-21 ENCOUNTER — Encounter (HOSPITAL_COMMUNITY): Payer: Self-pay

## 2016-06-21 ENCOUNTER — Emergency Department (HOSPITAL_COMMUNITY)
Admission: EM | Admit: 2016-06-21 | Discharge: 2016-06-21 | Disposition: A | Discharge status: Home / Self Care | Payer: No Typology Code available for payment source | Attending: Emergency Medicine | Admitting: Emergency Medicine

## 2016-06-21 DIAGNOSIS — R51 Headache: Secondary | ICD-10-CM | POA: Insufficient documentation

## 2016-06-21 DIAGNOSIS — R519 Headache, unspecified: Secondary | ICD-10-CM

## 2016-06-21 DIAGNOSIS — F17210 Nicotine dependence, cigarettes, uncomplicated: Secondary | ICD-10-CM | POA: Insufficient documentation

## 2016-06-21 DIAGNOSIS — R0981 Nasal congestion: Secondary | ICD-10-CM

## 2016-06-21 MED ORDER — FLUTICASONE PROPIONATE 50 MCG/ACT NA SUSP: 1.0000 | Freq: Every day | NASAL | 2 refills | Status: DC

## 2016-06-21 MED ORDER — ACETAMINOPHEN 325 MG PO TABS
650.0000 mg | ORAL_TABLET | Freq: Once | ORAL | Status: AC
  Administered 2016-06-21: 650 mg via ORAL
  Filled 2016-06-21: qty 2

## 2016-06-21 NOTE — ED Provider Notes (Signed)
[facility] DEPT Provider Note   CSN: [id number] Arrival date & time: 06/21/16  1334     History   Chief Complaint Chief Complaint  Patient presents with  . Facial Pain  . Sore Throat  . Otalgia    HPI Tamara Barnes is a 50 y.o. female with no significant past medical history who presents to the ED today complaining of right-sided facial pain and otalgia. Patient states that 3 days ago she began experiencing nasal congestion and right-sided facial pain that is not radiating into her right ear and right jaw. Patient states that she woke up this morning and her throat was very sore and scratchy. She has also been experiencing a dry cough. She has tried taking Mucinex and Dimetapp for her symptoms without relief. She denies any fevers, blurry vision, dysphagia.  HPI  History reviewed. No pertinent past medical history.  Patient Active Problem List   Diagnosis Date Noted  . Sinusitis 07/04/2012  . Elevated BP 07/04/2012  . Rhinitis medicamentosa 07/04/2012  . FIBROIDS, UTERUS 02/24/2009  . UNSPECIFIED ABNORMAL MAMMOGRAM 02/24/2009  . 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 ANEMIA 06/04/2008  . SCIATICA 06/04/2008  . TOBACCO DEPENDENCE 11/10/2006    Past Surgical History:  Procedure Laterality Date  . tuabl ligation      OB History    No data available       Home Medications    Prior to Admission medications   Medication Sig Start Date End Date Taking? Authorizing Provider  acetaminophen (TYLENOL) 500 MG tablet Take 1,500 mg by mouth every 6 (six) hours as needed. For pain    Historical Provider, MD  amoxicillin-clavulanate (AUGMENTIN) 875-125 MG per tablet Take 1 tablet by mouth 2 (two) times daily. 07/04/12   Luetta Nutting, DO  cetirizine (ZYRTEC ALLERGY) 10 MG tablet Take 1 tablet (10 mg total) by mouth daily. 06/11/12   Perlie Mayo, MD  doxycycline (VIBRAMYCIN) 100 MG capsule Take 1 capsule (100 mg total) by mouth 2 (two) times daily. One po bid x 7  days 08/05/15   Mercedes Camprubi-Soms, PA-C  fluticasone (FLONASE) 50 MCG/ACT nasal spray Place 2 sprays into the nose daily. 07/04/12   Luetta Nutting, DO  fluticasone (FLONASE) 50 MCG/ACT nasal spray Place 1 spray into both nostrils daily. 06/21/16   Reggie Welge Tripp Mehar [place]wood, PA-C  HYDROcodone-acetaminophen (NORCO/VICODIN) 5-325 MG per tablet Take 1 tablet by mouth every 6 (six) hours as needed for moderate pain or severe pain. 01/21/15   Hannah Muthersbaugh, PA-C  magic mouthwash w/lidocaine SOLN Take 10 mLs by mouth 3 (three) times daily as needed for mouth pain. 08/05/15   Mercedes Camprubi-Soms, PA-C  naproxen (NAPROSYN) 500 MG tablet Take 1 tablet (500 mg total) by mouth 2 (two) times daily with a meal. 01/21/15   Hannah Muthersbaugh, PA-C  oxyCODONE-acetaminophen (PERCOCET) 5-325 MG per tablet Take 1 tablet by mouth every 6 (six) hours as needed for pain. 06/11/12   Perlie Mayo, MD  sodium chloride (OCEAN) 0.65 % nasal spray Place 1 spray into the nose as needed for congestion. 06/11/12   Perlie Mayo, MD    Family History No family history on file.  Social History Social History  Substance Use Topics  . Smoking status: Current Every Day Smoker    Packs/day: 0.25    Types: Cigarettes  . Smokeless tobacco: Never Used  . Alcohol use Yes     Comment: socially     Allergies   Review of patient's allergies indicates no  known allergies.   Review of Systems Review of Systems  All other systems reviewed and are negative.    Physical Exam Updated Vital Signs BP 170/91 (BP Location: Right Arm)   Pulse 79   Temp 98.7 F (37.1 C) (Oral)   Resp 22   Ht 5\' 3"  (1.6 m)   Wt 80.7 kg   LMP 01/14/2015   SpO2 99%   BMI 31.53 kg/m   Physical Exam  Constitutional: She is oriented to person, place, and time. She appears well-developed and well-nourished. No distress.  HENT:  Head: Normocephalic and atraumatic.  Right Ear: Tympanic membrane normal.  Left Ear: Tympanic membrane  normal.  Nose: Mucosal edema present. Right sinus exhibits maxillary sinus tenderness.  Mouth/Throat: Uvula is midline, oropharynx is clear and moist and mucous membranes are normal. No trismus in the jaw. No oropharyngeal exudate, posterior oropharyngeal edema, posterior oropharyngeal erythema or tonsillar abscesses. Tonsils are 0 on the right. Tonsils are 0 on the left. No tonsillar exudate.  Eyes: Conjunctivae are normal. Right eye exhibits no discharge. Left eye exhibits no discharge. No scleral icterus.  Cardiovascular: Normal rate.   Pulmonary/Chest: Effort normal.  Neurological: She is alert and oriented to person, place, and time. Coordination normal.  Skin: Skin is warm and dry. No rash noted. She is not diaphoretic. No erythema. No pallor.  Psychiatric: She has a normal mood and affect. Her behavior is normal.  Nursing note and vitals reviewed.    ED Treatments / Results  Labs (all labs ordered are listed, but only abnormal results are displayed) Labs Reviewed - No data to display  EKG  EKG Interpretation None       Radiology No results found.  Procedures Procedures (including critical care time)  Medications Ordered in ED Medications  acetaminophen (TYLENOL) tablet 650 mg (650 mg Oral Given 06/21/16 1604)     Initial Impression / Assessment and Plan / ED Course  I have reviewed the triage vital signs and the nursing notes.  Pertinent labs & imaging results that were available during my care of the patient were reviewed by me and considered in my medical decision making (see chart for details).  Clinical Course    Mild to moderate symptoms of clear/yellow nasal discharge/congestion and scratchy throat with cough for less than 10 days.  Patient is afebrile.  No concern for acute bacterial rhinosinusitis; likely viral in nature.  Patient discharged with symptomatic treatment, flonase.  Patient instructions given for warm saline nasal washes.  Recommendations for  follow-up with primary care physician.     Final Clinical Impressions(s) / ED Diagnoses   Final diagnoses:  Sinus congestion  Facial pain    New Prescriptions Discharge Medication List as of 06/21/2016  4:01 PM    START taking these medications   Details  !! fluticasone (FLONASE) 50 MCG/ACT nasal spray Place 1 spray into both nostrils daily., Starting Mon 06/21/2016, Print     !! - Potential duplicate medications found. Please discuss with provider.       Dondra Spry [place], PA-C 06/21/16 2151    Julianne Rice, MD 06/27/16 [phone]

## 2016-06-21 NOTE — ED Triage Notes (Signed)
Patient complains of right sided facial pain, right earache, sore throat and congestion x 3 days. NAD. Alert and oriented

## 2016-06-21 NOTE — Discharge Instructions (Signed)
Encourage nasal rinsing. Use nasal spray as prescribed. Take sudafed for nasal congestion. Take tylenol as needed for pain. Follow up with your primary care provider if your symptoms do not improve. Return to the ED if you experience severe worsening of your symptoms, fevers, blurred vision, difficulty breathing or swallowing.

## 2016-06-21 NOTE — ED Notes (Signed)
See PA note for secondary assessment.   

## 2016-06-23 ENCOUNTER — Emergency Department (HOSPITAL_COMMUNITY)
Admission: EM | Admit: 2016-06-23 | Discharge: 2016-06-23 | Disposition: A | Discharge status: Home / Self Care | Payer: No Typology Code available for payment source | Attending: Emergency Medicine | Admitting: Emergency Medicine

## 2016-06-23 ENCOUNTER — Encounter (HOSPITAL_COMMUNITY): Payer: Self-pay | Admitting: *Deleted

## 2016-06-23 DIAGNOSIS — K0889 Other specified disorders of teeth and supporting structures: Secondary | ICD-10-CM | POA: Insufficient documentation

## 2016-06-23 DIAGNOSIS — F17210 Nicotine dependence, cigarettes, uncomplicated: Secondary | ICD-10-CM | POA: Insufficient documentation

## 2016-06-23 MED ORDER — PENICILLIN V POTASSIUM 250 MG PO TABS: 250.0000 mg | ORAL_TABLET | Freq: Four times a day (QID) | ORAL | 0 refills | Status: AC

## 2016-06-23 MED ORDER — ACETAMINOPHEN-CODEINE #3 300-30 MG PO TABS: 1.0000 | ORAL_TABLET | Freq: Four times a day (QID) | ORAL | 0 refills | Status: DC | PRN

## 2016-06-23 NOTE — ED Triage Notes (Signed)
Pt reports being seen here on 10/9 for sinus infection and not given antibiotics. Now has right side oral/gum swelling. Airway intact.

## 2016-06-23 NOTE — ED Provider Notes (Signed)
[facility] DEPT Provider Note   CSN: [id number] Arrival date & time: 06/23/16  0932   By signing my name below, I, Tamara Barnes, attest that this documentation has been prepared under the direction and in the presence of non-physician practitioner, Glendell Docker, NP. Electronically Signed: Evelene Barnes, Scribe. 06/23/2016. 9:41 AM.   History   Chief Complaint Chief Complaint  Patient presents with  . Oral Swelling    The history is provided by the patient. No language interpreter was used.    HPI Comments:  Tamara Barnes is a 50 y.o. female who presents to the Emergency Department complaining of right sided facial/gum swelling x ~ 5 days. She reports associated dental pain. Pt states she was seen in the ED 2 days ago and diagnosed with a sinus infection and placed on flonase. She has also been taking tylenol without relief. She denies use of HTN meds or recent antibiotics.   History reviewed. No pertinent past medical history.  Patient Active Problem List   Diagnosis Date Noted  . Sinusitis 07/04/2012  . Elevated BP 07/04/2012  . Rhinitis medicamentosa 07/04/2012  . FIBROIDS, UTERUS 02/24/2009  . UNSPECIFIED ABNORMAL MAMMOGRAM 02/24/2009  . 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 ANEMIA 06/04/2008  . SCIATICA 06/04/2008  . TOBACCO DEPENDENCE 11/10/2006    Past Surgical History:  Procedure Laterality Date  . tuabl ligation      OB History    No data available       Home Medications    Prior to Admission medications   Medication Sig Start Date End Date Taking? Authorizing Provider  acetaminophen (TYLENOL) 500 MG tablet Take 1,500 mg by mouth every 6 (six) hours as needed. For pain    Historical Provider, MD  amoxicillin-clavulanate (AUGMENTIN) 875-125 MG per tablet Take 1 tablet by mouth 2 (two) times daily. 07/04/12   Luetta Nutting, DO  cetirizine (ZYRTEC ALLERGY) 10 MG tablet Take 1 tablet (10 mg total) by mouth daily. 06/11/12   Perlie Mayo, MD  doxycycline (VIBRAMYCIN) 100 MG capsule Take 1 capsule (100 mg total) by mouth 2 (two) times daily. One po bid x 7 days 08/05/15   Mercedes Camprubi-Soms, PA-C  fluticasone (FLONASE) 50 MCG/ACT nasal spray Place 2 sprays into the nose daily. 07/04/12   Luetta Nutting, DO  fluticasone (FLONASE) 50 MCG/ACT nasal spray Place 1 spray into both nostrils daily. 06/21/16   Samantha Tripp [place], PA-C  HYDROcodone-acetaminophen (NORCO/VICODIN) 5-325 MG per tablet Take 1 tablet by mouth every 6 (six) hours as needed for moderate pain or severe pain. 01/21/15   Hannah Muthersbaugh, PA-C  magic mouthwash w/lidocaine SOLN Take 10 mLs by mouth 3 (three) times daily as needed for mouth pain. 08/05/15   Mercedes Camprubi-Soms, PA-C  naproxen (NAPROSYN) 500 MG tablet Take 1 tablet (500 mg total) by mouth 2 (two) times daily with a meal. 01/21/15   Hannah Muthersbaugh, PA-C  oxyCODONE-acetaminophen (PERCOCET) 5-325 MG per tablet Take 1 tablet by mouth every 6 (six) hours as needed for pain. 06/11/12   Perlie Mayo, MD  sodium chloride (OCEAN) 0.65 % nasal spray Place 1 spray into the nose as needed for congestion. 06/11/12   Perlie Mayo, MD    Family History History reviewed. No pertinent family history.  Social History Social History  Substance Use Topics  . Smoking status: Current Every Day Smoker    Packs/day: 0.25    Types: Cigarettes  . Smokeless tobacco: Never Used  . Alcohol use Yes  Comment: socially     Allergies   Review of patient's allergies indicates no known allergies.   Review of Systems Review of Systems  Constitutional: Negative for chills.  HENT: Positive for dental problem and facial swelling.     Physical Exam Updated Vital Signs BP (!) 186/108 (BP Location: Left Arm)   Pulse 73   Temp 98 F (36.7 C) (Oral)   Resp 23   Ht 5\' 2"  (1.575 m)   Wt 175 lb (79.4 kg)   LMP 01/14/2015   SpO2 96%   BMI 32.01 kg/m   Physical Exam  Constitutional: She is  oriented to person, place, and time. She appears well-developed and well-nourished. No distress.  HENT:  Head: Normocephalic and atraumatic.  Right Ear: External ear normal.  Left Ear: External ear normal.  Mouth/Throat: Oropharynx is clear and moist.  No lip swelling.redness and inflammation noted to the gum  Eyes: Conjunctivae are normal.  Neck: Normal range of motion.  Cardiovascular: Normal rate.   Pulmonary/Chest: Effort normal.  Abdominal: She exhibits no distension.  Neurological: She is alert and oriented to person, place, and time.  Skin: Skin is warm and dry.  Psychiatric: She has a normal mood and affect.  Nursing note and vitals reviewed.    ED Treatments / Results  DIAGNOSTIC STUDIES:  Oxygen Saturation is 96% on RA, normal by my interpretation.    COORDINATION OF CARE:  9:46 AM Discussed treatment plan with pt at bedside and pt agreed to plan.  Labs (all labs ordered are listed, but only abnormal results are displayed) Labs Reviewed - No data to display  EKG  EKG Interpretation None       Radiology No results found.  Procedures Procedures (including critical care time)  Medications Ordered in ED Medications - No data to display   Initial Impression / Assessment and Plan / ED Course  I have reviewed the triage vital signs and the nursing notes.  Pertinent labs & imaging results that were available during my care of the patient were reviewed by me and considered in my medical decision making (see chart for details).  Clinical Course   No sign of angio edema. Will treat with pcn and tylenol 3 for pain  Patient with dentalgia and facial swelling.  No abscess requiring incision and drainage noted.  Exam not concerning for Ludwig's angina or pharyngeal abscess.  Will treat with pcn and tylenol 3.  Discussed return precautions. Pt safe for discharge.  Final Clinical Impressions(s) / ED Diagnoses   Final diagnoses:  None    New  Prescriptions New Prescriptions   No medications on file   I personally performed the services described in this documentation, which was scribed in my presence. The recorded information has been reviewed and is accurate.     Glendell Docker, NP 06/23/16 [id number]    Duffy Bruce, MD 06/23/16 1753

## 2016-06-23 NOTE — ED Notes (Signed)
See NP assessment.

## 2016-09-02 ENCOUNTER — Encounter (HOSPITAL_COMMUNITY): Payer: Self-pay

## 2016-09-02 ENCOUNTER — Emergency Department (HOSPITAL_COMMUNITY)
Admission: EM | Admit: 2016-09-02 | Discharge: 2016-09-02 | Disposition: A | Discharge status: Home / Self Care | Payer: No Typology Code available for payment source | Attending: Emergency Medicine | Admitting: Emergency Medicine

## 2016-09-02 DIAGNOSIS — L0291 Cutaneous abscess, unspecified: Secondary | ICD-10-CM

## 2016-09-02 DIAGNOSIS — L02214 Cutaneous abscess of groin: Secondary | ICD-10-CM | POA: Insufficient documentation

## 2016-09-02 DIAGNOSIS — F17210 Nicotine dependence, cigarettes, uncomplicated: Secondary | ICD-10-CM | POA: Insufficient documentation

## 2016-09-02 MED ORDER — SULFAMETHOXAZOLE-TRIMETHOPRIM 800-160 MG PO TABS: 1.0000 | ORAL_TABLET | Freq: Two times a day (BID) | ORAL | 0 refills | Status: AC

## 2016-09-02 MED ORDER — OXYCODONE-ACETAMINOPHEN 5-325 MG PO TABS
1.0000 | ORAL_TABLET | Freq: Once | ORAL | Status: AC
  Administered 2016-09-02: 1 via ORAL
  Filled 2016-09-02: qty 1

## 2016-09-02 MED ORDER — LIDOCAINE-EPINEPHRINE (PF) 2 %-1:200000 IJ SOLN
20.0000 mL | Freq: Once | INTRAMUSCULAR | Status: AC
  Administered 2016-09-02: 20 mL
  Filled 2016-09-02: qty 20

## 2016-09-02 NOTE — ED Notes (Signed)
Pt is in stable condition upon d/c and ambulates from ED. 

## 2016-09-02 NOTE — ED Notes (Signed)
Pt given gown to change into.

## 2016-09-02 NOTE — Discharge Instructions (Signed)
Follow up with your doctor, an urgent care, or return to ED in order to remove your packing in 48-72 hours. You may return to the emergency department if you have  a fever that persists greater than 101 or your abscess appears to become infected (growing surrounding redness and warmth).   Abscess An abscess (boil or furuncle) is an infected area that contains a collection of pus.   SYMPTOMS Signs and symptoms of an abscess include pain, tenderness, redness, or hardness. You may feel a moveable soft area under your skin. An abscess can occur anywhere in the body.   TREATMENT  A surgical cut (incision) may be made over your abscess to drain the pus. Gauze may be packed into the space or a drain may be looped through the abscess cavity (pocket). This provides a drain that will allow the cavity to heal from the inside outwards. The abscess may be painful for a few days, but should feel much better if it was drained.  Your abscess, if seen early, may not have localized and may not have been drained. If not, another appointment may be required if it does not get better on its own or with medications.  HOME CARE INSTRUCTIONS  Only take over-the-counter or prescription medicines for pain, discomfort, or fever as directed by your caregiver.  Take your antibiotics as directed if they were prescribed. Finish them even if you start to feel better.  Keep the skin and clothes clean around your abscess.  If the abscess was drained, you will need to use gauze dressing to collect any draining pus. Dressings will typically need to be changed 3 or more times a day.  The infection may spread by skin contact with others. Avoid skin contact as much as possible.  Practice good hygiene. This includes regular hand washing, cover any draining skin lesions, and do not share personal care items.  If you participate in sports, do not share athletic equipment, towels, whirlpools, or personal care items. Shower after every  practice or tournament.  If a draining area cannot be adequately covered:  Do not participate in sports.  Children should not participate in day care until the wound has healed or drainage stops.  If your caregiver has given you a follow-up appointment, it is very important to keep that appointment. Not keeping the appointment could result in a much worse infection, chronic or permanent injury, pain, and disability. If there is any problem keeping the appointment, you must call back to this facility for assistance.   SEEK MEDICAL CARE IF:  You develop increased pain, swelling, redness, drainage, or bleeding in the wound site.  You develop signs of generalized infection including muscle aches, chills, fever, or a general ill feeling.  You have an oral temperature above 102 F (38.9 C).  MAKE SURE YOU:  Understand these instructions.  Will watch your condition.  Will get help right away if you are not doing well or get worse.  Document Released: 06/09/2005 Document Revised: 05/12/2011 Document Reviewed: 04/02/2008 [facility] Patient Information 2012 [place].

## 2016-09-02 NOTE — ED Provider Notes (Signed)
[facility] DEPT Provider Note   CSN: [id number] Arrival date & time: 09/02/16  [id number]  By signing my name below, I, Charolotte Eke, attest that this documentation has been prepared under the direction and in the presence of [facility], PA-C . Electronically Signed: Charolotte Eke, Scribe. 09/02/16. 9:42 AM. History   Chief Complaint Chief Complaint  Patient presents with  . Abscess    HPI Comments: Tamara Barnes is a 50 y.o. female who presents to the Emergency Department complaining of a gradual onset, constant, gradually worsened area of pain and swelling to the left groin that was first noticed yesterday. The patient describes the area as growing from pea sized to golf ball sized in the last day.  She states palpation exacerbates the pain. Pt has been taking 800 mg ibuprofen without relief. Pt denies fever or drainage from the affected area.   The history is provided by the patient. No language interpreter was used.    History reviewed. No pertinent past medical history.  Patient Active Problem List   Diagnosis Date Noted  . Sinusitis 07/04/2012  . Elevated BP 07/04/2012  . Rhinitis medicamentosa 07/04/2012  . FIBROIDS, UTERUS 02/24/2009  . UNSPECIFIED ABNORMAL MAMMOGRAM 02/24/2009  . 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 ANEMIA 06/04/2008  . SCIATICA 06/04/2008  . TOBACCO DEPENDENCE 11/10/2006    Past Surgical History:  Procedure Laterality Date  . tuabl ligation      OB History    No data available       Home Medications    Prior to Admission medications   Medication Sig Start Date End Date Taking? Authorizing Provider  acetaminophen (TYLENOL) 500 MG tablet Take 1,500 mg by mouth every 6 (six) hours as needed. For pain    Historical Provider, MD  acetaminophen-codeine (TYLENOL #3) 300-30 MG tablet Take 1-2 tablets by mouth every 6 (six) hours as needed for moderate pain. 06/23/16   Glendell Docker, NP  amoxicillin-clavulanate (AUGMENTIN) 875-125 MG per  tablet Take 1 tablet by mouth 2 (two) times daily. 07/04/12   Luetta Nutting, DO  cetirizine (ZYRTEC ALLERGY) 10 MG tablet Take 1 tablet (10 mg total) by mouth daily. 06/11/12   Perlie Mayo, MD  doxycycline (VIBRAMYCIN) 100 MG capsule Take 1 capsule (100 mg total) by mouth 2 (two) times daily. One po bid x 7 days 08/05/15   Mercedes Camprubi-Soms, PA-C  fluticasone (FLONASE) 50 MCG/ACT nasal spray Place 2 sprays into the nose daily. 07/04/12   Luetta Nutting, DO  fluticasone (FLONASE) 50 MCG/ACT nasal spray Place 1 spray into both nostrils daily. 06/21/16   Samantha Tripp Dowless, PA-C  HYDROcodone-acetaminophen (NORCO/VICODIN) 5-325 MG per tablet Take 1 tablet by mouth every 6 (six) hours as needed for moderate pain or severe pain. 01/21/15   Hannah Muthersbaugh, PA-C  magic mouthwash w/lidocaine SOLN Take 10 mLs by mouth 3 (three) times daily as needed for mouth pain. 08/05/15   Mercedes Camprubi-Soms, PA-C  naproxen (NAPROSYN) 500 MG tablet Take 1 tablet (500 mg total) by mouth 2 (two) times daily with a meal. 01/21/15   Hannah Muthersbaugh, PA-C  oxyCODONE-acetaminophen (PERCOCET) 5-325 MG per tablet Take 1 tablet by mouth every 6 (six) hours as needed for pain. 06/11/12   Perlie Mayo, MD  sodium chloride (OCEAN) 0.65 % nasal spray Place 1 spray into the nose as needed for congestion. 06/11/12   Perlie Mayo, MD  sulfamethoxazole-trimethoprim (BACTRIM DS,SEPTRA DS) 800-160 MG tablet Take 1 tablet by mouth 2 (two) times daily. 09/02/16  09/09/16  Sanders, PA-C    Family History No family history on file.  Social History Social History  Substance Use Topics  . Smoking status: Current Every Day Smoker    Packs/day: 0.25    Types: Cigarettes  . Smokeless tobacco: Never Used  . Alcohol use Yes     Comment: socially     Allergies   Patient has no known allergies.   Review of Systems Review of Systems  Constitutional: Negative for fever.  Skin: Positive for wound.      Physical Exam Updated Vital Signs BP 189/96 (BP Location: Left Arm)   Pulse 104   Temp 97.9 F (36.6 C) (Oral)   Resp 14   Ht 5\' 2"  (1.575 m)   Wt 77.1 kg   LMP 01/14/2015   SpO2 95%   BMI 31.09 kg/m   Physical Exam  Constitutional: She is oriented to person, place, and time. She appears well-developed and well-nourished. No distress.  HENT:  Head: Normocephalic and atraumatic.  Cardiovascular: Normal rate, regular rhythm and normal heart sounds.   No murmur heard. Pulmonary/Chest: Effort normal and breath sounds normal. No respiratory distress.  Abdominal: Soft. She exhibits no distension. There is no tenderness.  Musculoskeletal: She exhibits no edema.  Neurological: She is alert and oriented to person, place, and time.  Skin: Skin is warm and dry. No erythema.  Left groin with 1 by 2 cm area of fluctuance with no surrounding erythema. Tender to touch. No warmth.  Nursing note and vitals reviewed.    ED Treatments / Results   DIAGNOSTIC STUDIES: Oxygen Saturation is 95% on room air, adequate by my interpretation.    COORDINATION OF CARE: 9:37 AM Discussed treatment plan with pt at bedside and pt agreed to plan.    Labs (all labs ordered are listed, but only abnormal results are displayed) Labs Reviewed - No data to display  EKG  EKG Interpretation None       Radiology No results found.  Procedures Procedures (including critical care time) INCISION AND DRAINAGE PROCEDURE NOTE: Patient identification was confirmed and verbal consent was obtained. This procedure was performed by Pearlie Oyster, PA-C at 11:17 AM. Site: left groin Sterile procedures observed Needle size: 25 Anesthetic used (type and amt): lidocaine 2% with epinephrine 68mls Blade size: 11 Drainage: moderate purulent Complexity: Complex Packing used yes Site anesthetized, incision made over site, wound drained and explored loculations, rinsed with copious amounts of normal saline,  wound packed with sterile gauze, covered with dry, sterile dressing.  Pt tolerated procedure well without complications.  Instructions for care discussed verbally and pt provided with additional written instructions for homecare and f/u.   Medications Ordered in ED Medications  oxyCODONE-acetaminophen (PERCOCET/ROXICET) 5-325 MG per tablet 1 tablet (1 tablet Oral Given 09/02/16 1006)  lidocaine-EPINEPHrine (XYLOCAINE W/EPI) 2 %-1:200000 (PF) injection 20 mL (20 mLs Infiltration Given 09/02/16 1006)     Initial Impression / Assessment and Plan / ED Course  I have reviewed the triage vital signs and the nursing notes.  Pertinent labs & imaging results that were available during my care of the patient were reviewed by me and considered in my medical decision making (see chart for details).  Clinical Course    Patient presenting with abscess requiring incision and drainage. No evidence of surrounding erythema to suggest cellulitis. No crepitance to suggest necrotizing fasciitis. Incision and drainage performed per procedure note. Patient tolerated the procedure well. Patient was prescribed Bactrim. Wound care instructions discussed.  Wound check in 2-3 days. Blood pressure elevated in ED today. Encouraged to follow up with PCP in a week or two for blood pressure recheck. Return to ER if concern for spread of infection, increasing pain, fevers, or other concerns. All questions answered.   Final Clinical Impressions(s) / ED Diagnoses   Final diagnoses:  Abscess    New Prescriptions Discharge Medication List as of 09/02/2016 11:33 AM    START taking these medications   Details  sulfamethoxazole-trimethoprim (BACTRIM DS,SEPTRA DS) 800-160 MG tablet Take 1 tablet by mouth 2 (two) times daily., Starting Thu 09/02/2016, Until Thu 09/09/2016, Print       I personally performed the services described in this documentation, which was scribed in my presence. The recorded information has been  reviewed and is accurate.     [facility], PA-C 09/02/16 1204    Blanchie Dessert, MD 09/04/16 [phone]

## 2016-09-02 NOTE — ED Triage Notes (Signed)
Pt presents for evaluation of possible abscess to L groin area. Pt. States noticed dime sized area yesterday with tenderness, today states it is golfball size today. Pt denies redness or fever. Pt AxO x4, ambulatory, in NAD.

## 2016-09-05 ENCOUNTER — Encounter (HOSPITAL_COMMUNITY): Payer: Self-pay | Admitting: Emergency Medicine

## 2016-09-05 ENCOUNTER — Emergency Department (HOSPITAL_COMMUNITY)
Admission: EM | Admit: 2016-09-05 | Discharge: 2016-09-05 | Disposition: A | Discharge status: Home / Self Care | Payer: No Typology Code available for payment source | Attending: Emergency Medicine | Admitting: Emergency Medicine

## 2016-09-05 DIAGNOSIS — Z4801 Encounter for change or removal of surgical wound dressing: Secondary | ICD-10-CM | POA: Insufficient documentation

## 2016-09-05 DIAGNOSIS — Z09 Encounter for follow-up examination after completed treatment for conditions other than malignant neoplasm: Secondary | ICD-10-CM

## 2016-09-05 DIAGNOSIS — Z79899 Other long term (current) drug therapy: Secondary | ICD-10-CM | POA: Insufficient documentation

## 2016-09-05 DIAGNOSIS — F17210 Nicotine dependence, cigarettes, uncomplicated: Secondary | ICD-10-CM | POA: Insufficient documentation

## 2016-09-05 MED ORDER — HYDROCODONE-ACETAMINOPHEN 5-325 MG PO TABS
1.0000 | ORAL_TABLET | Freq: Once | ORAL | Status: AC
  Administered 2016-09-05: 1 via ORAL
  Filled 2016-09-05: qty 1

## 2016-09-05 MED ORDER — CEPHALEXIN 500 MG PO CAPS: 500.0000 mg | ORAL_CAPSULE | Freq: Four times a day (QID) | ORAL | 0 refills | Status: DC

## 2016-09-05 MED ORDER — CEPHALEXIN 250 MG PO CAPS
500.0000 mg | ORAL_CAPSULE | Freq: Once | ORAL | Status: AC
  Administered 2016-09-05: 500 mg via ORAL
  Filled 2016-09-05: qty 2

## 2016-09-05 MED ORDER — NAPROXEN 500 MG PO TABS: 500.0000 mg | ORAL_TABLET | Freq: Two times a day (BID) | ORAL | 0 refills | Status: DC

## 2016-09-05 NOTE — ED Notes (Signed)
Declined W/C at D/C and was escorted to lobby by RN. 

## 2016-09-05 NOTE — ED Triage Notes (Signed)
Pt states she is here to have packing removed from abscess that was drained on Thursday from groin area.

## 2016-09-05 NOTE — Discharge Instructions (Signed)
Sit in warm tubs of water or apply warm wet compresses to the area several times a day.

## 2016-09-05 NOTE — ED Provider Notes (Signed)
[place] [facility] DEPT Provider Note    By signing my name below, I, Bea Graff, attest that this documentation has been prepared under the direction and in the presence of [facility]. Electronically Signed: Bea Graff, ED Scribe. 09/05/16. 7:10 PM.    History   Chief Complaint Chief Complaint  Patient presents with  . Wound Check   The history is provided by the patient and medical records. No language interpreter was used.    HPI Comments:  Tamara Barnes is an obese 50 y.o. female who presents to the Emergency Department wanting a wound check from an incision and drainage that was done three days ago. She states she had an abscess to the left groin and was told to return today to have the packing removed. She reports moderate soreness of the area. She has been taking her daughter's Tylenol #3 for pain with some relief. She reports being discharged with Septra that she states she has been taking as directed. Moving or touching the area increases the pain. She denies alleviating factors. She denies fever, chills, nausea, vomiting, warmth, increased redness or increased swelling of the area.   History reviewed. No pertinent past medical history.  Patient Active Problem List   Diagnosis Date Noted  . Sinusitis 07/04/2012  . Elevated BP 07/04/2012  . Rhinitis medicamentosa 07/04/2012  . FIBROIDS, UTERUS 02/24/2009  . UNSPECIFIED ABNORMAL MAMMOGRAM 02/24/2009  . 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 ANEMIA 06/04/2008  . SCIATICA 06/04/2008  . TOBACCO DEPENDENCE 11/10/2006    Past Surgical History:  Procedure Laterality Date  . tuabl ligation      OB History    No data available       Home Medications    Prior to Admission medications   Medication Sig Start Date End Date Taking? Authorizing Provider  acetaminophen (TYLENOL) 500 MG tablet Take 1,500 mg by mouth every 6 (six) hours as needed. For pain    Historical Provider, MD    acetaminophen-codeine (TYLENOL #3) 300-30 MG tablet Take 1-2 tablets by mouth every 6 (six) hours as needed for moderate pain. 06/23/16   Glendell Docker, NP  cephALEXin (KEFLEX) 500 MG capsule Take 1 capsule (500 mg total) by mouth 4 (four) times daily. 09/05/16   Marche Hottenstein Bunnie Pion, NP  cetirizine (ZYRTEC ALLERGY) 10 MG tablet Take 1 tablet (10 mg total) by mouth daily. 06/11/12   Perlie Mayo, MD  fluticasone (FLONASE) 50 MCG/ACT nasal spray Place 2 sprays into the nose daily. 07/04/12   Luetta Nutting, DO  fluticasone (FLONASE) 50 MCG/ACT nasal spray Place 1 spray into both nostrils daily. 06/21/16   Samantha Tripp Dowless, PA-C  magic mouthwash w/lidocaine SOLN Take 10 mLs by mouth 3 (three) times daily as needed for mouth pain. 08/05/15   Mercedes Camprubi-Soms, PA-C  naproxen (NAPROSYN) 500 MG tablet Take 1 tablet (500 mg total) by mouth 2 (two) times daily. 09/05/16   Kain Milosevic Bunnie Pion, NP  sodium chloride (OCEAN) 0.65 % nasal spray Place 1 spray into the nose as needed for congestion. 06/11/12   Perlie Mayo, MD  sulfamethoxazole-trimethoprim (BACTRIM DS,SEPTRA DS) 800-160 MG tablet Take 1 tablet by mouth 2 (two) times daily. 09/02/16 09/09/16  [place], PA-C    Family History No family history on file.  Social History Social History  Substance Use Topics  . Smoking status: Current Every Day Smoker    Packs/day: 0.25    Types: Cigarettes  . Smokeless tobacco: Never Used  . Alcohol use  Yes     Comment: socially     Allergies   Patient has no known allergies.   Review of Systems Review of Systems  Constitutional: Negative for chills and fever.  Gastrointestinal: Negative for nausea and vomiting.  Skin: Positive for wound. Negative for color change.  All other systems reviewed and are negative.    Physical Exam Updated Vital Signs BP 141/87 (BP Location: Left Arm)   Pulse 77   Temp 97.9 F (36.6 C) (Oral)   Resp 12   Ht 5\' 2"  (1.575 m)   Wt 170 lb (77.1 kg)    LMP 01/14/2015   SpO2 100%   BMI 31.09 kg/m   Physical Exam  Constitutional: She is oriented to person, place, and time. She appears well-developed and well-nourished.  Eyes: EOM are normal.  Neck: Neck supple.  Pulmonary/Chest: Effort normal.  Abdominal: Soft. There is no tenderness.  Musculoskeletal: Normal range of motion.  Neurological: She is alert and oriented to person, place, and time. No cranial nerve deficit.  Skin: Skin is warm and dry.  Packing was placed in left inguinal area. Continues to be firm, tender area surrounding wound. Wound is draining.  Nursing note and vitals reviewed.    ED Treatments / Results  DIAGNOSTIC STUDIES: Oxygen Saturation is 100% on RA, normal by my interpretation.   COORDINATION OF CARE: 7:04 PM- Packing removed without difficulty. Prescribe Keflex in addition to the Bactrim. Encouraged pt to do warm soaks. Pt verbalizes understanding and agrees to plan.  Medications  HYDROcodone-acetaminophen (NORCO/VICODIN) 5-325 MG per tablet 1 tablet (1 tablet Oral Given 09/05/16 1912)  cephALEXin (KEFLEX) capsule 500 mg (500 mg Oral Given 09/05/16 1912)    Labs (all labs ordered are listed, but only abnormal results are displayed) Labs Reviewed - No data to display  Radiology No results found.  Procedures Procedures (including critical care time)  Medications Ordered in ED Medications  HYDROcodone-acetaminophen (NORCO/VICODIN) 5-325 MG per tablet 1 tablet (1 tablet Oral Given 09/05/16 1912)  cephALEXin (KEFLEX) capsule 500 mg (500 mg Oral Given 09/05/16 1912)     Initial Impression / Assessment and Plan / ED Course  I have reviewed the triage vital signs and the nursing notes.   Clinical Course     Patient returns for check of recent incision and drainage for packing removal. The region appears to be well-healing and infection appears to be resolving. Patient symptoms improved from prior visit. Afebrile and hemodynamically stable. Pt  is instructed to continue with home care or antibiotics. Pt has a good understanding of return precautions and is safe for discharge at this time.  I personally performed the services described in this documentation, which was scribed in my presence. The recorded information has been reviewed and is accurate.   Final Clinical Impressions(s) / ED Diagnoses   Final diagnoses:  Encounter for recheck of abscess following incision and drainage    New Prescriptions Discharge Medication List as of 09/05/2016  7:10 PM    START taking these medications   Details  cephALEXin (KEFLEX) 500 MG capsule Take 1 capsule (500 mg total) by mouth 4 (four) times daily., Starting Sun 09/05/2016, [place], NP 09/05/16 2210    Leo Grosser, MD 09/06/16 [phone]

## 2017-09-11 ENCOUNTER — Encounter (HOSPITAL_COMMUNITY): Payer: Self-pay | Admitting: Emergency Medicine

## 2017-09-11 ENCOUNTER — Other Ambulatory Visit: Payer: Self-pay

## 2017-09-11 ENCOUNTER — Emergency Department (HOSPITAL_COMMUNITY)
Admission: EM | Admit: 2017-09-11 | Discharge: 2017-09-12 | Disposition: A | Discharge status: Home / Self Care | Payer: No Typology Code available for payment source | Attending: Emergency Medicine | Admitting: Emergency Medicine

## 2017-09-11 DIAGNOSIS — K0889 Other specified disorders of teeth and supporting structures: Secondary | ICD-10-CM | POA: Insufficient documentation

## 2017-09-11 DIAGNOSIS — R590 Localized enlarged lymph nodes: Secondary | ICD-10-CM | POA: Insufficient documentation

## 2017-09-11 DIAGNOSIS — F17210 Nicotine dependence, cigarettes, uncomplicated: Secondary | ICD-10-CM | POA: Insufficient documentation

## 2017-09-11 DIAGNOSIS — R591 Generalized enlarged lymph nodes: Secondary | ICD-10-CM

## 2017-09-11 DIAGNOSIS — Z79899 Other long term (current) drug therapy: Secondary | ICD-10-CM | POA: Insufficient documentation

## 2017-09-11 MED ORDER — CLINDAMYCIN HCL 150 MG PO CAPS
300.0000 mg | ORAL_CAPSULE | Freq: Once | ORAL | Status: AC
  Administered 2017-09-11: 300 mg via ORAL
  Filled 2017-09-11: qty 2

## 2017-09-11 MED ORDER — IBUPROFEN 800 MG PO TABS: 800.0000 mg | ORAL_TABLET | Freq: Three times a day (TID) | ORAL | 0 refills | Status: DC

## 2017-09-11 MED ORDER — CLINDAMYCIN HCL 150 MG PO CAPS: 300.0000 mg | ORAL_CAPSULE | Freq: Four times a day (QID) | ORAL | 0 refills | Status: DC

## 2017-09-11 NOTE — Discharge Instructions (Signed)
Please read and follow all provided instructions.  Your diagnoses today include:  1. Lymphadenopathy   2. Pain, dental    The exam and treatment you received today has been provided on an emergency basis only. This is not a substitute for complete medical or dental care.  Tests performed today include:  Vital signs. See below for your results today.   Medications prescribed:   Clindamycin - antibiotic  You have been prescribed an antibiotic medicine: take the entire course of medicine even if you are feeling better. Stopping early can cause the antibiotic not to work.   Ibuprofen (Motrin, Advil) - anti-inflammatory pain medication  Do not exceed 600mg  ibuprofen every 6 hours, take with food  You have been prescribed an anti-inflammatory medication or NSAID. Take with food. Take smallest effective dose for the shortest duration needed for your pain. Stop taking if you experience stomach pain or vomiting.   Take any prescribed medications only as directed.  Home care instructions:  Follow any educational materials contained in this packet.  Follow-up instructions: See your doctor if the lymph does not go away in 2 weeks.   Return instructions:   Please return to the Emergency Department if you experience worsening symptoms.  Please return if you develop a fever, you develop more swelling in your face or neck, you have trouble breathing or swallowing food.  Please return if you have any other emergent concerns.  Additional Information:  Your vital signs today were: BP (!) 168/89 (BP Location: Right Arm)    Pulse 83    Temp 98.3 F (36.8 C) (Oral)    Resp 18    Ht 5\' 2"  (1.575 m)    Wt 75.8 kg (167 lb)    LMP 01/14/2015    SpO2 97%    BMI 30.54 kg/m  If your blood pressure (BP) was elevated above 135/85 this visit, please have this repeated by your doctor within one month. --------------

## 2017-09-11 NOTE — ED Triage Notes (Signed)
Pt c/o swelling to R side of neck radiating into ear and head. Pt states she had several weeks of hoarseness from 12-14 until last week, with no pain. 2 days ago swelling pain began.

## 2017-09-11 NOTE — ED Provider Notes (Signed)
[name] EMERGENCY DEPARTMENT Provider Note   CSN: [id number] Arrival date & time: 09/11/17  2026     History   Chief Complaint Chief Complaint  Patient presents with  . Lymphadenopathy    HPI Tamara Barnes is a 51 y.o. female.  Patient presents with complaint of a swollen tender lymph node in her right neck for the past several days.  She states that it is making her throat sore and she has trouble swallowing because of it.  No fevers.  Prior to the lymph node, patient had a viral illness with a hoarse voice for several days.  Current pain radiates to her teeth but denies any trouble with her dentition.  No swelling.  Patient has been taking ibuprofen with relief of symptoms. The onset of this condition was acute. The course is constant. Aggravating factors: none. Alleviating factors: none.        History reviewed. No pertinent past medical history.  Patient Active Problem List   Diagnosis Date Noted  . Sinusitis 07/04/2012  . Elevated BP 07/04/2012  . Rhinitis medicamentosa 07/04/2012  . FIBROIDS, UTERUS 02/24/2009  . UNSPECIFIED ABNORMAL MAMMOGRAM 02/24/2009  . 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 ANEMIA 06/04/2008  . SCIATICA 06/04/2008  . TOBACCO DEPENDENCE 11/10/2006    Past Surgical History:  Procedure Laterality Date  . tuabl ligation      OB History    No data available       Home Medications    Prior to Admission medications   Medication Sig Start Date End Date Taking? Authorizing Provider  acetaminophen (TYLENOL) 500 MG tablet Take 1,500 mg by mouth every 6 (six) hours as needed. For pain    [provider]  acetaminophen-codeine (TYLENOL #3) 300-30 MG tablet Take 1-2 tablets by mouth every 6 (six) hours as needed for moderate pain. 06/23/16   Glendell Docker, NP  cephALEXin (KEFLEX) 500 MG capsule Take 1 capsule (500 mg total) by mouth 4 (four) times daily. 09/05/16   Ashley Murrain, NP  cetirizine  (ZYRTEC ALLERGY) 10 MG tablet Take 1 tablet (10 mg total) by mouth daily. 06/11/12   Powers, Lonia Farber, MD  clindamycin (CLEOCIN) 150 MG capsule Take 2 capsules (300 mg total) by mouth every 6 (six) hours. 09/11/17   Carlisle Cater, PA-C  fluticasone (FLONASE) 50 MCG/ACT nasal spray Place 2 sprays into the nose daily. 07/04/12   Luetta Nutting, DO  fluticasone (FLONASE) 50 MCG/ACT nasal spray Place 1 spray into both nostrils daily. 06/21/16   Dowless, Samantha Tripp, PA-C  ibuprofen (ADVIL,MOTRIN) 800 MG tablet Take 1 tablet (800 mg total) by mouth 3 (three) times daily. 09/11/17   Carlisle Cater, PA-C  magic mouthwash w/lidocaine SOLN Take 10 mLs by mouth 3 (three) times daily as needed for mouth pain. 08/05/15   Street, Mercedes, PA-C  naproxen (NAPROSYN) 500 MG tablet Take 1 tablet (500 mg total) by mouth 2 (two) times daily. 09/05/16   Ashley Murrain, NP  sodium chloride (OCEAN) 0.65 % nasal spray Place 1 spray into the nose as needed for congestion. 06/11/12   Powers, Lonia Farber, MD    Family History No family history on file.  Social History Social History   Tobacco Use  . Smoking status: Current Every Day Smoker    Packs/day: 0.25    Types: Cigarettes  . Smokeless tobacco: Never Used  Substance Use Topics  . Alcohol use: Yes    Comment: socially  . Drug use: No  Allergies   Patient has no known allergies.   Review of Systems Review of Systems  Constitutional: Negative for fever.  HENT: Positive for sore throat and trouble swallowing. Negative for dental problem, ear pain and facial swelling.   Respiratory: Negative for shortness of breath and stridor.   Musculoskeletal: Negative for neck pain.  Skin: Negative for color change.  Neurological: Negative for headaches.     Physical Exam Updated Vital Signs BP (!) 168/89 (BP Location: Right Arm)   Pulse 83   Temp 98.3 F (36.8 C) (Oral)   Resp 18   Ht 5\' 2"  (1.575 m)   Wt 75.8 kg (167 lb)   LMP 01/14/2015   SpO2 97%    BMI 30.54 kg/m   Physical Exam  Constitutional: She appears well-developed and well-nourished.  HENT:  Head: Normocephalic and atraumatic.  Right Ear: Tympanic membrane, external ear and ear canal normal.  Left Ear: Tympanic membrane, external ear and ear canal normal.  Nose: Nose normal.  Mouth/Throat: Uvula is midline, oropharynx is clear and moist and mucous membranes are normal. No trismus in the jaw. Abnormal dentition. Dental caries present. No dental abscesses or uvula swelling. No tonsillar abscesses.  No gross dental abscess noted.  Slight gingival inflammation noted.  Eyes: Conjunctivae are normal.  Neck: Normal range of motion. Neck supple.  There is a right sided anterior cervical lymph node, mildly tender.  Full range of motion of neck.  No overlying erythema or fluctuance.  Patient talking with normal voice.  Lymphadenopathy:    She has cervical adenopathy.  Neurological: She is alert.  Skin: Skin is warm and dry.  Psychiatric: She has a normal mood and affect.  Nursing note and vitals reviewed.    ED Treatments / Results  Labs (all labs ordered are listed, but only abnormal results are displayed) Labs Reviewed - No data to display  EKG  EKG Interpretation None       Radiology No results found.  Procedures Procedures (including critical care time)  Medications Ordered in ED Medications  clindamycin (CLEOCIN) capsule 300 mg (not administered)     Initial Impression / Assessment and Plan / ED Course  I have reviewed the triage vital signs and the nursing notes.  Pertinent labs & imaging results that were available during my care of the patient were reviewed by me and considered in my medical decision making (see chart for details).     Patient seen and examined.   Vital signs reviewed and are as follows: BP (!) 146/81 (BP Location: Right Arm)   Pulse 65   Temp 98.3 F (36.8 C) (Oral)   Resp 15   Ht 5\' 2"  (1.575 m)   Wt 75.8 kg (167 lb)    LMP 01/14/2015   SpO2 98%   BMI 30.54 kg/m   Patient with isolated mildly tender lymph node without signs of abscess.  Normal voice, no trismus, full range of motion of neck.  Do not suspect deep space abscess.  Will give course of clindamycin to treat any occult dental infection or skin infections.  Patient encouraged to recheck with her PCP in 2 weeks if not improved.  Return sooner with worsening swelling, fevers, inability to swallow, other concerns.  She verbalizes understanding and agrees with plan.  Final Clinical Impressions(s) / ED Diagnoses   Final diagnoses:  Lymphadenopathy  Pain, dental    ED Discharge Orders        Ordered    clindamycin (CLEOCIN) 150 MG  capsule  Every 6 hours     09/11/17 2339    ibuprofen (ADVIL,MOTRIN) 800 MG tablet  3 times daily     09/11/17 2339       Carlisle Cater, PA-C 09/11/17 2350    Ward, Delice Bison, DO 09/12/17 [phone]

## 2017-09-12 NOTE — ED Notes (Signed)
Pt verbalized understanding of dc instructions. E signature pad unavailable

## 2018-08-27 ENCOUNTER — Emergency Department (HOSPITAL_COMMUNITY)
Admission: EM | Admit: 2018-08-27 | Discharge: 2018-08-27 | Disposition: A | Discharge status: Home / Self Care | Payer: No Typology Code available for payment source | Attending: Emergency Medicine | Admitting: Emergency Medicine

## 2018-08-27 ENCOUNTER — Emergency Department (HOSPITAL_COMMUNITY): Payer: No Typology Code available for payment source

## 2018-08-27 ENCOUNTER — Encounter (HOSPITAL_COMMUNITY): Payer: Self-pay

## 2018-08-27 DIAGNOSIS — F17210 Nicotine dependence, cigarettes, uncomplicated: Secondary | ICD-10-CM | POA: Insufficient documentation

## 2018-08-27 DIAGNOSIS — M79645 Pain in left finger(s): Secondary | ICD-10-CM | POA: Insufficient documentation

## 2018-08-27 DIAGNOSIS — Z79899 Other long term (current) drug therapy: Secondary | ICD-10-CM | POA: Insufficient documentation

## 2018-08-27 NOTE — ED Notes (Signed)
Pt stable, ambulatory, states understanding of discharge instructions 

## 2018-08-27 NOTE — Discharge Instructions (Signed)
You have been seen today for a finger injury. There were no acute abnormalities on the x-rays, including no sign of fracture or dislocation, however, there could be injuries to the soft tissues, such as the ligaments or tendons that are not seen on xrays. There could also be what are called occult fractures that are small fractures not seen on xray. Antiinflammatory medications: Take 600 mg of ibuprofen every 6 hours or 440 mg (over the counter dose) to 500 mg (prescription dose) of naproxen every 12 hours for the next 3 days. After this time, these medications may be used as needed for pain. Take these medications with food to avoid upset stomach. Choose only one of these medications, do not take them together. Acetaminophen (generic for Tylenol): Should you continue to have additional pain while taking the ibuprofen or naproxen, you may add in acetaminophen as needed. Your daily total maximum amount of acetaminophen from all sources should be limited to 4000mg /day for persons without liver problems, or 2000mg /day for those with liver problems. Ice: May apply ice to the area over the next 24 hours for 15 minutes at a time to reduce swelling. Elevation: Keep the extremity elevated as often as possible to reduce pain and inflammation. Support: Wear the splint for support and comfort. Wear this until pain resolves.  Follow up: If symptoms are improving, you may follow up with your primary care provider for any continued management. If symptoms are not starting to improve within a week, you should follow up with the orthopedic specialist within two weeks. Return: Return to the ED for numbness, weakness, increasing pain, overall worsening symptoms, loss of function, or if symptoms are not improving, you have tried to follow up with the orthopedic specialist, and have been unable to do so.  For prescription assistance, may try using prescription discount sites or apps, such as goodrx.com

## 2018-08-27 NOTE — ED Triage Notes (Signed)
Pt c/o left pointer finger pain for a month, has full mobility.

## 2018-08-27 NOTE — ED Provider Notes (Signed)
[facility] EMERGENCY DEPARTMENT Provider Note   CSN: [id number] Arrival date & time: 08/27/18  1328     History   Chief Complaint Chief Complaint  Patient presents with  . Hand Pain    HPI Tamara Barnes is a 52 y.o. female.  HPI   Tamara Barnes is a 52 y.o. female, presenting to the ED with left hand pain for the last several days. Patient states she thinks the pain stems from an injury that she sustained about 6 weeks ago that involved her bending the left index finger dorsally at the MCP joint.  States her pain from that incident improved and then she began having renewed pain a few days ago.  This pain is aching and sometimes sharp radiating from the index finger MCP joint distally into the finger and proximally into the hand. 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 Denies numbness, weakness, subsequent injury, or any other complaints.     History reviewed. No pertinent past medical history.  Patient Active Problem List   Diagnosis Date Noted  . Sinusitis 07/04/2012  . Elevated BP 07/04/2012  . Rhinitis medicamentosa 07/04/2012  . FIBROIDS, UTERUS 02/24/2009  . UNSPECIFIED ABNORMAL MAMMOGRAM 02/24/2009  . 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 ANEMIA 06/04/2008  . SCIATICA 06/04/2008  . TOBACCO DEPENDENCE 11/10/2006    Past Surgical History:  Procedure Laterality Date  . tuabl ligation       OB History   No obstetric history on file.      Home Medications    Prior to Admission medications   Medication Sig Start Date End Date Taking? Authorizing Provider  acetaminophen (TYLENOL) 500 MG tablet Take 1,500 mg by mouth every 6 (six) hours as needed. For pain    [provider]  acetaminophen-codeine (TYLENOL #3) 300-30 MG tablet Take 1-2 tablets by mouth every 6 (six) hours as needed for moderate pain. 06/23/16   Glendell Docker, NP  cephALEXin (KEFLEX) 500 MG capsule Take 1 capsule (500 mg  total) by mouth 4 (four) times daily. 09/05/16   Ashley Murrain, NP  cetirizine (ZYRTEC ALLERGY) 10 MG tablet Take 1 tablet (10 mg total) by mouth daily. 06/11/12   Powers, Lonia Farber, MD  clindamycin (CLEOCIN) 150 MG capsule Take 2 capsules (300 mg total) by mouth every 6 (six) hours. 09/11/17   Carlisle Cater, PA-C  fluticasone (FLONASE) 50 MCG/ACT nasal spray Place 2 sprays into the nose daily. 07/04/12   Luetta Nutting, DO  fluticasone (FLONASE) 50 MCG/ACT nasal spray Place 1 spray into both nostrils daily. 06/21/16   Dowless, Samantha Tripp, PA-C  ibuprofen (ADVIL,MOTRIN) 800 MG tablet Take 1 tablet (800 mg total) by mouth 3 (three) times daily. 09/11/17   Carlisle Cater, PA-C  magic mouthwash w/lidocaine SOLN Take 10 mLs by mouth 3 (three) times daily as needed for mouth pain. 08/05/15   Street, Mercedes, PA-C  naproxen (NAPROSYN) 500 MG tablet Take 1 tablet (500 mg total) by mouth 2 (two) times daily. 09/05/16   Ashley Murrain, NP  sodium chloride (OCEAN) 0.65 % nasal spray Place 1 spray into the nose as needed for congestion. 06/11/12   Powers, Lonia Farber, MD    Family History History reviewed. No pertinent family history.  Social History Social History   Tobacco Use  . Smoking status: Current Every Day Smoker    Packs/day: 0.25    Types: Cigarettes  . Smokeless tobacco: Never Used  Substance Use Topics  .  Alcohol use: Yes    Comment: socially  . Drug use: No     Allergies   Patient has no known allergies.   Review of Systems Review of Systems  Musculoskeletal: Positive for arthralgias.  Neurological: Negative for weakness and numbness.     Physical Exam Updated Vital Signs BP (!) 170/95   Pulse 88   Temp 98.3 F (36.8 C) (Oral)   Resp 18   Ht 5' 2.5" (1.588 m)   Wt 74.4 kg   LMP 01/14/2015   SpO2 98%   BMI 29.52 kg/m   Physical Exam Vitals signs and nursing note reviewed.  Constitutional:      General: She is not in acute distress.    Appearance: She is  well-developed. She is not diaphoretic.  HENT:     Head: Normocephalic and atraumatic.  Eyes:     Conjunctiva/sclera: Conjunctivae normal.  Neck:     Musculoskeletal: Neck supple.  Cardiovascular:     Rate and Rhythm: Normal rate and regular rhythm.     Pulses:          Radial pulses are 2+ on the right side and 2+ on the left side.  Pulmonary:     Effort: Pulmonary effort is normal.  Musculoskeletal:     Comments: Tenderness to the left index finger at the dorsal MCP joint without deformity, swelling, erythema, or increased warmth. Some pain with range of motion at the MCP joint. Full range of motion without pain in the PIP and DIP joints. No pain, tenderness, or swelling to the left wrist or any of the other fingers.  No snuffbox tenderness.  Skin:    General: Skin is warm and dry.     Capillary Refill: Capillary refill takes less than 2 seconds.     Coloration: Skin is not pale.  Neurological:     Mental Status: She is alert.     Comments: Sensation grossly intact to light touch through each of the nerve distributions of the bilateral upper extremities. Abduction and adduction of the fingers intact against resistance. Grip strength equal bilaterally. Supination and pronation intact against resistance. Strength 5/5 through the cardinal directions of the bilateral wrists. Patient can touch the thumb to each one of the fingertips without difficulty.   Psychiatric:        Behavior: Behavior normal.      ED Treatments / Results  Labs (all labs ordered are listed, but only abnormal results are displayed) Labs Reviewed - No data to display  EKG None  Radiology Dg Hand Complete Left  Result Date: 08/27/2018 CLINICAL DATA:  Hand pain, most prominent the second digit. No trauma. EXAM: LEFT HAND - COMPLETE 3+ VIEW COMPARISON:  None. FINDINGS: There is no evidence of fracture or dislocation. There is no evidence of arthropathy or other focal bone abnormality. Soft tissues are  unremarkable. IMPRESSION: Negative. Electronically Signed   By: Dorise Bullion III M.D   On: 08/27/2018 14:38    Procedures Procedures (including critical care time)  Medications Ordered in ED Medications - No data to display   Initial Impression / Assessment and Plan / ED Course  I have reviewed the triage vital signs and the nursing notes.  Pertinent labs & imaging results that were available during my care of the patient were reviewed by me and considered in my medical decision making (see chart for details).     Patient presents with left index finger pain.  No signs of neurologic, circulatory, or functional  compromise.  No acute abnormalities noted on x-ray.  PCP versus orthopedic follow-up.    We discussed patient's elevated blood pressure here in the ED.  Asymptomatic to it at this time.  Recommended PCP follow-up.  Final Clinical Impressions(s) / ED Diagnoses   Final diagnoses:  Pain in finger of left hand    ED Discharge Orders    None       Layla Maw 08/27/18 1502    Margette Fast, MD 08/27/18 2017

## 2018-08-31 ENCOUNTER — Other Ambulatory Visit: Payer: Self-pay | Admitting: Family

## 2018-08-31 DIAGNOSIS — Z1231 Encounter for screening mammogram for malignant neoplasm of breast: Secondary | ICD-10-CM

## 2018-10-06 ENCOUNTER — Ambulatory Visit: Payer: Self-pay

## 2019-08-17 ENCOUNTER — Other Ambulatory Visit: Payer: Self-pay

## 2019-08-17 DIAGNOSIS — Z20822 Contact with and (suspected) exposure to covid-19: Secondary | ICD-10-CM

## 2019-08-20 ENCOUNTER — Telehealth: Payer: Self-pay | Admitting: *Deleted

## 2019-08-20 LAB — NOVEL CORONAVIRUS, NAA: SARS-CoV-2, NAA: NOT DETECTED

## 2019-08-20 NOTE — Telephone Encounter (Signed)
Patient called for covid results,still pending.

## 2019-08-21 ENCOUNTER — Telehealth: Payer: Self-pay

## 2019-08-21 NOTE — Telephone Encounter (Signed)
Caller given negative result and verbalized understanding  

## 2019-08-21 NOTE — Telephone Encounter (Signed)
Negative COVID results given. Patient results "NOT Detected." Caller expressed understanding. ° °

## 2019-08-22 ENCOUNTER — Telehealth: Payer: Self-pay | Admitting: *Deleted

## 2019-08-22 NOTE — Telephone Encounter (Signed)
Patient called ,requesting results are faxed to her employer [employer] care                   Fax # [phone]                  Cb# [phone]

## 2019-11-24 ENCOUNTER — Ambulatory Visit: Payer: Self-pay | Attending: Internal Medicine

## 2019-11-24 DIAGNOSIS — Z23 Encounter for immunization: Secondary | ICD-10-CM

## 2019-11-24 NOTE — Progress Notes (Signed)
   Covid-19 Vaccination Clinic  Name:  Tamara Barnes    MRN: [id number] DOB: 05/31/1966  11/24/2019  Tamara Barnes was observed post Covid-19 immunization for 15 minutes without incident. She was provided with Vaccine Information Sheet and instruction to access the V-Safe system.   Tamara Barnes was instructed to call 911 with any severe reactions post vaccine: Marland Kitchen Difficulty breathing  . Swelling of face and throat  . A fast heartbeat  . A bad rash all over body  . Dizziness and weakness   Immunizations Administered    Name Date Dose VIS Date Route   Pfizer COVID-19 Vaccine 11/24/2019 10:21 AM 0.3 mL 08/24/2019 Intramuscular   Manufacturer: [place]   Lot: [id number]   [place]: [id number]

## 2019-12-17 ENCOUNTER — Ambulatory Visit: Payer: Self-pay

## 2019-12-19 ENCOUNTER — Ambulatory Visit: Payer: Self-pay

## 2019-12-21 ENCOUNTER — Ambulatory Visit: Payer: Self-pay | Attending: Internal Medicine

## 2019-12-21 DIAGNOSIS — Z23 Encounter for immunization: Secondary | ICD-10-CM

## 2019-12-21 NOTE — Progress Notes (Signed)
   Covid-19 Vaccination Clinic  Name:  Tyanne Sessum    MRN: [id number] DOB: 1966/05/08  12/21/2019  Ms. Witherow was observed post Covid-19 immunization for 15 minutes without incident. She was provided with Vaccine Information Sheet and instruction to access the V-Safe system.   Ms. Porges was instructed to call 911 with any severe reactions post vaccine: Marland Kitchen Difficulty breathing  . Swelling of face and throat  . A fast heartbeat  . A bad rash all over body  . Dizziness and weakness   Immunizations Administered    Name Date Dose VIS Date Route   Pfizer COVID-19 Vaccine 12/21/2019  1:01 PM 0.3 mL 08/24/2019 Intramuscular   Manufacturer: [place]   Lot: [id number]   [place]: [id number]

## 2019-12-24 ENCOUNTER — Ambulatory Visit: Payer: Self-pay

## 2020-04-07 ENCOUNTER — Encounter (HOSPITAL_COMMUNITY): Payer: Self-pay

## 2020-04-07 ENCOUNTER — Ambulatory Visit (INDEPENDENT_AMBULATORY_CARE_PROVIDER_SITE_OTHER)
Admission: EM | Admit: 2020-04-07 | Discharge: 2020-04-07 | Disposition: A | Discharge status: Nursing Home (Includes ICF) | Payer: 59 | Source: Home / Self Care

## 2020-04-07 ENCOUNTER — Other Ambulatory Visit: Payer: Self-pay

## 2020-04-07 ENCOUNTER — Emergency Department (HOSPITAL_COMMUNITY): Payer: 59

## 2020-04-07 ENCOUNTER — Inpatient Hospital Stay (HOSPITAL_COMMUNITY)
Admission: EM | Admit: 2020-04-07 | Discharge: 2020-04-10 | DRG: 690 | Disposition: A | Discharge status: Home / Self Care | Payer: 59 | Attending: Internal Medicine | Admitting: Internal Medicine

## 2020-04-07 DIAGNOSIS — R109 Unspecified abdominal pain: Secondary | ICD-10-CM | POA: Diagnosis not present

## 2020-04-07 DIAGNOSIS — N10 Acute tubulo-interstitial nephritis: Principal | ICD-10-CM | POA: Diagnosis present

## 2020-04-07 DIAGNOSIS — J302 Other seasonal allergic rhinitis: Secondary | ICD-10-CM | POA: Diagnosis present

## 2020-04-07 DIAGNOSIS — R6883 Chills (without fever): Secondary | ICD-10-CM | POA: Diagnosis not present

## 2020-04-07 DIAGNOSIS — Z560 Unemployment, unspecified: Secondary | ICD-10-CM

## 2020-04-07 DIAGNOSIS — I10 Essential (primary) hypertension: Secondary | ICD-10-CM | POA: Diagnosis present

## 2020-04-07 DIAGNOSIS — Z841 Family history of disorders of kidney and ureter: Secondary | ICD-10-CM

## 2020-04-07 DIAGNOSIS — R310 Gross hematuria: Secondary | ICD-10-CM | POA: Diagnosis present

## 2020-04-07 DIAGNOSIS — E119 Type 2 diabetes mellitus without complications: Secondary | ICD-10-CM

## 2020-04-07 DIAGNOSIS — R7881 Bacteremia: Secondary | ICD-10-CM | POA: Diagnosis present

## 2020-04-07 DIAGNOSIS — Z1611 Resistance to penicillins: Secondary | ICD-10-CM | POA: Diagnosis present

## 2020-04-07 DIAGNOSIS — B9620 Unspecified Escherichia coli [E. coli] as the cause of diseases classified elsewhere: Secondary | ICD-10-CM

## 2020-04-07 DIAGNOSIS — R509 Fever, unspecified: Secondary | ICD-10-CM

## 2020-04-07 DIAGNOSIS — Z20822 Contact with and (suspected) exposure to covid-19: Secondary | ICD-10-CM | POA: Diagnosis present

## 2020-04-07 DIAGNOSIS — Z91018 Allergy to other foods: Secondary | ICD-10-CM

## 2020-04-07 DIAGNOSIS — N12 Tubulo-interstitial nephritis, not specified as acute or chronic: Secondary | ICD-10-CM | POA: Diagnosis present

## 2020-04-07 DIAGNOSIS — K5900 Constipation, unspecified: Secondary | ICD-10-CM | POA: Diagnosis not present

## 2020-04-07 DIAGNOSIS — E876 Hypokalemia: Secondary | ICD-10-CM | POA: Diagnosis present

## 2020-04-07 DIAGNOSIS — E1165 Type 2 diabetes mellitus with hyperglycemia: Secondary | ICD-10-CM | POA: Diagnosis present

## 2020-04-07 DIAGNOSIS — Z79899 Other long term (current) drug therapy: Secondary | ICD-10-CM

## 2020-04-07 DIAGNOSIS — F17210 Nicotine dependence, cigarettes, uncomplicated: Secondary | ICD-10-CM | POA: Diagnosis present

## 2020-04-07 LAB — CBC
HCT: 42.5 % (ref 36.0–46.0)
Hemoglobin: 13.6 g/dL (ref 12.0–15.0)
MCH: 29.5 pg (ref 26.0–34.0)
MCHC: 32 g/dL (ref 30.0–36.0)
MCV: 92.2 fL (ref 80.0–100.0)
Platelets: 169 10*3/uL (ref 150–400)
RBC: 4.61 MIL/uL (ref 3.87–5.11)
RDW: 12.4 % (ref 11.5–15.5)
WBC: 12.2 10*3/uL — ABNORMAL HIGH (ref 4.0–10.5)
nRBC: 0 % (ref 0.0–0.2)

## 2020-04-07 LAB — LACTIC ACID, PLASMA: Lactic Acid, Venous: 1.7 mmol/L (ref 0.5–1.9)

## 2020-04-07 LAB — URINALYSIS, ROUTINE W REFLEX MICROSCOPIC
Bilirubin Urine: NEGATIVE
Glucose, UA: >=500 mg/dL — AB
Ketones, ur: 20 mg/dL — AB
Nitrite: NEGATIVE
Protein, ur: 100 mg/dL — AB
Specific Gravity, Urine: 1.023 (ref 1.005–1.030)
WBC, UA: >50 WBC/hpf — ABNORMAL HIGH (ref 0–5)
pH: 5 (ref 5.0–8.0)

## 2020-04-07 LAB — POCT URINALYSIS DIP (DEVICE)
Bilirubin Urine: NEGATIVE
Glucose, UA: NEGATIVE mg/dL
Ketones, ur: 40 mg/dL — AB
Leukocytes,Ua: NEGATIVE
Nitrite: NEGATIVE
Protein, ur: 100 mg/dL — AB
Specific Gravity, Urine: 1.01 (ref 1.005–1.030)
Urobilinogen, UA: 2 mg/dL — ABNORMAL HIGH (ref 0.0–1.0)
pH: 6 (ref 5.0–8.0)

## 2020-04-07 LAB — COMPREHENSIVE METABOLIC PANEL
ALT: 15 U/L (ref 0–44)
AST: 17 U/L (ref 15–41)
Albumin: 2.9 g/dL — ABNORMAL LOW (ref 3.5–5.0)
Alkaline Phosphatase: 91 U/L (ref 38–126)
Anion gap: 12 (ref 5–15)
BUN: 11 mg/dL (ref 6–20)
CO2: 22 mmol/L (ref 22–32)
Calcium: 9.2 mg/dL (ref 8.9–10.3)
Chloride: 93 mmol/L — ABNORMAL LOW (ref 98–111)
Creatinine, Ser: 0.96 mg/dL (ref 0.44–1.00)
GFR calc Af Amer: >60 mL/min (ref >60)
GFR calc non Af Amer: >60 mL/min (ref >60)
Glucose, Bld: 398 mg/dL — ABNORMAL HIGH (ref 70–99)
Potassium: 3.9 mmol/L (ref 3.5–5.1)
Sodium: 127 mmol/L — ABNORMAL LOW (ref 135–145)
Total Bilirubin: 1.2 mg/dL (ref 0.3–1.2)
Total Protein: 7.4 g/dL (ref 6.5–8.1)

## 2020-04-07 LAB — SARS CORONAVIRUS 2 (TAT 6-24 HRS): SARS Coronavirus 2: NEGATIVE

## 2020-04-07 LAB — SARS CORONAVIRUS 2 BY RT PCR (HOSPITAL ORDER, PERFORMED IN ~~LOC~~ HOSPITAL LAB): SARS Coronavirus 2: NEGATIVE

## 2020-04-07 LAB — PROTIME-INR
INR: 1.1 (ref 0.8–1.2)
Prothrombin Time: 13.9 seconds (ref 11.4–15.2)

## 2020-04-07 LAB — LIPASE, BLOOD: Lipase: 32 U/L (ref 11–51)

## 2020-04-07 LAB — APTT: aPTT: 31 seconds (ref 24–36)

## 2020-04-07 LAB — HEMOGLOBIN A1C
Hgb A1c MFr Bld: 11 % — ABNORMAL HIGH (ref 4.8–5.6)
Mean Plasma Glucose: 269 mg/dL

## 2020-04-07 LAB — CBG MONITORING, ED: Glucose-Capillary: 284 mg/dL — ABNORMAL HIGH (ref 70–99)

## 2020-04-07 MED ORDER — HYDROCODONE-ACETAMINOPHEN 5-325 MG PO TABS
ORAL_TABLET | ORAL | Status: AC
  Filled 2020-04-07: qty 1

## 2020-04-07 MED ORDER — SODIUM CHLORIDE 0.9 % IV SOLN: INTRAVENOUS | Status: AC

## 2020-04-07 MED ORDER — ACETAMINOPHEN 325 MG PO TABS
650.0000 mg | ORAL_TABLET | Freq: Four times a day (QID) | ORAL | Status: DC | PRN
  Administered 2020-04-07: 650 mg via ORAL
  Filled 2020-04-07: qty 2

## 2020-04-07 MED ORDER — SODIUM CHLORIDE 0.9 % IV SOLN
1.0000 g | INTRAVENOUS | Status: DC
  Administered 2020-04-07: 1 g via INTRAVENOUS
  Filled 2020-04-07 (×2): qty 10

## 2020-04-07 MED ORDER — LACTATED RINGERS IV BOLUS (SEPSIS)
1000.0000 mL | Freq: Once | INTRAVENOUS | Status: AC
  Administered 2020-04-07: 1000 mL via INTRAVENOUS

## 2020-04-07 MED ORDER — ENOXAPARIN SODIUM 40 MG/0.4ML ~~LOC~~ SOLN
40.0000 mg | SUBCUTANEOUS | Status: DC
  Administered 2020-04-08 – 2020-04-09 (×2): 40 mg via SUBCUTANEOUS
  Filled 2020-04-07 (×2): qty 0.4

## 2020-04-07 MED ORDER — SODIUM CHLORIDE 0.9% FLUSH
3.0000 mL | Freq: Once | INTRAVENOUS | Status: AC
  Administered 2020-04-07: 3 mL via INTRAVENOUS

## 2020-04-07 MED ORDER — ACETAMINOPHEN 650 MG RE SUPP: 650.0000 mg | Freq: Four times a day (QID) | RECTAL | Status: DC | PRN

## 2020-04-07 MED ORDER — INSULIN ASPART 100 UNIT/ML ~~LOC~~ SOLN
0.0000 [IU] | Freq: Three times a day (TID) | SUBCUTANEOUS | Status: DC
  Administered 2020-04-08: 3 [IU] via SUBCUTANEOUS
  Administered 2020-04-08 – 2020-04-09 (×3): 5 [IU] via SUBCUTANEOUS
  Administered 2020-04-09 – 2020-04-10 (×3): 3 [IU] via SUBCUTANEOUS
  Administered 2020-04-10: 5 [IU] via SUBCUTANEOUS

## 2020-04-07 MED ORDER — HYDROCODONE-ACETAMINOPHEN 5-325 MG PO TABS
2.0000 | ORAL_TABLET | Freq: Once | ORAL | Status: AC
  Administered 2020-04-07: 2 via ORAL

## 2020-04-07 MED ORDER — ACETAMINOPHEN 500 MG PO TABS
1000.0000 mg | ORAL_TABLET | Freq: Once | ORAL | Status: AC
  Administered 2020-04-07: 1000 mg via ORAL
  Filled 2020-04-07: qty 2

## 2020-04-07 NOTE — ED Provider Notes (Signed)
[facility]    CSN: [id number] Arrival date & time: 04/07/20  1583      History   Chief Complaint Chief Complaint  Patient presents with  . Flank Pain  . Fever  . Chills    HPI Tamara Barnes is a 54 y.o. female.   HPI  Patient presents today with a 4-day history of gradually worsening right flank pain with fever and chills.  Patient denies any dysuria, nausea, or vomiting.  She has had very poor appetite and has been unable to eat since Thursday due to poor appetite. No history of renal stones or recurrent UTI. No sick contacts in the home. She rates pain as 10/10 not improved with positional changes or rest. She has not taken any medication today. Denies CP, SOB, dizziness, or new weakness.  History reviewed. No pertinent past medical history.  Patient Active Problem List   Diagnosis Date Noted  . Sinusitis 07/04/2012  . Elevated BP 07/04/2012  . Rhinitis medicamentosa 07/04/2012  . FIBROIDS, UTERUS 02/24/2009  . UNSPECIFIED ABNORMAL MAMMOGRAM 02/24/2009  . 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 ANEMIA 06/04/2008  . SCIATICA 06/04/2008  . TOBACCO DEPENDENCE 11/10/2006    Past Surgical History:  Procedure Laterality Date  . tuabl ligation      OB History   No obstetric history on file.      Home Medications    Prior to Admission medications   Medication Sig Start Date End Date Taking? Authorizing Provider  acetaminophen (TYLENOL) 500 MG tablet Take 1,500 mg by mouth every 6 (six) hours as needed. For pain    [provider]  acetaminophen-codeine (TYLENOL #3) 300-30 MG tablet Take 1-2 tablets by mouth every 6 (six) hours as needed for moderate pain. 06/23/16   Glendell Docker, NP  cephALEXin (KEFLEX) 500 MG capsule Take 1 capsule (500 mg total) by mouth 4 (four) times daily. 09/05/16   Ashley Murrain, NP  cetirizine (ZYRTEC ALLERGY) 10 MG tablet Take 1 tablet (10 mg total) by mouth daily. 06/11/12   Powers, Lonia Farber, MD    clindamycin (CLEOCIN) 150 MG capsule Take 2 capsules (300 mg total) by mouth every 6 (six) hours. 09/11/17   Carlisle Cater, PA-C  fluticasone (FLONASE) 50 MCG/ACT nasal spray Place 2 sprays into the nose daily. 07/04/12   Luetta Nutting, DO  fluticasone (FLONASE) 50 MCG/ACT nasal spray Place 1 spray into both nostrils daily. 06/21/16   Dowless, Samantha Tripp, PA-C  ibuprofen (ADVIL,MOTRIN) 800 MG tablet Take 1 tablet (800 mg total) by mouth 3 (three) times daily. 09/11/17   Carlisle Cater, PA-C  magic mouthwash w/lidocaine SOLN Take 10 mLs by mouth 3 (three) times daily as needed for mouth pain. 08/05/15   Street, Mercedes, PA-C  naproxen (NAPROSYN) 500 MG tablet Take 1 tablet (500 mg total) by mouth 2 (two) times daily. 09/05/16   Ashley Murrain, NP  sodium chloride (OCEAN) 0.65 % nasal spray Place 1 spray into the nose as needed for congestion. 06/11/12   Powers, Lonia Farber, MD    Family History Family History  Problem Relation Age of Onset  . Kidney failure Mother   . Heart disease Mother   . Liver disease Father     Social History Social History   Tobacco Use  . Smoking status: Current Every Day Smoker    Packs/day: 0.25    Types: Cigarettes  . Smokeless tobacco: Never Used  Substance Use Topics  . Alcohol use: Yes    Comment:  socially  . Drug use: No     Allergies   Patient has no known allergies.   Review of Systems Review of Systems Pertinent negatives listed in HPI  Physical Exam Triage Vital Signs ED Triage Vitals  Enc Vitals Group     BP 04/07/20 0916 (!) 129/73     Pulse Rate 04/07/20 0916 101     Resp 04/07/20 0916 16     Temp 04/07/20 0916 100.1 F (37.8 C)     Temp Source 04/07/20 0916 Oral     SpO2 04/07/20 0916 98 %     Weight 04/07/20 0922 136 lb 9.6 oz (62 kg)     Height --      Head Circumference --      Peak Flow --      Pain Score 04/07/20 0922 8     Pain Loc --      Pain Edu? --      Excl. in [place]? --    No data found.  Updated Vital  Signs BP (!) 129/73 (BP Location: Right Arm)   Pulse 101   Temp 100.1 F (37.8 C) (Oral)   Resp 16   Wt 136 lb 9.6 oz (62 kg)   LMP 01/14/2015   SpO2 98%   BMI 24.59 kg/m   Visual Acuity Right Eye Distance:   Left Eye Distance:   Bilateral Distance:    Right Eye Near:   Left Eye Near:    Bilateral Near:     Physical Exam General appearance: appears ill, acutely distressed, thin appearance  Head: Normocephalic, without obvious abnormality, atraumatic Respiratory: Respirations unlabored, normal respiratory rate, normal lung sounds  Heart: Tachycardia with +3 apical pulse, no gallop or murmurs noted on exam  Abdomen: BS +,Positive CVA tenderness right (severe), or no mass Skin: Skin color, texture, turgor normal. No rashes seen  Psych: Appropriate mood and affect. Neurologic: Mental status: Alert, oriented to person, place, and time, thought content appropriate.  UC Treatments / Results  Labs (all labs ordered are listed, but only abnormal results are displayed) POCT URINALYSIS DIP (DEVICE) - Abnormal; Notable for the following components:   Ketones, ur 40 (*)    Hgb urine dipstick MODERATE (*)    Protein, ur 100 (*)    Urobilinogen, UA 2.0 (*)    All other components within normal limits  SARS CORONAVIRUS 2 (TAT 6-24 HRS)    EKG   Radiology No results found.  Procedures Procedures (including critical care time)  Medications Ordered in UC Medications  HYDROcodone-acetaminophen (NORCO/VICODIN) 5-325 MG per tablet 2 tablet (2 tablets Oral Given 04/07/20 1023)    Initial Impression / Assessment and Plan / UC Course  I have reviewed the triage vital signs and the nursing notes.  Pertinent labs & imaging results that were available during my care of the patient were reviewed by me and considered in my medical decision making (see chart for details).    Given severity of pain, fever, gross hematuria in urine, suspect renal stone obstruction with urosepsis, patient  warrants work-up in a higher level of care. Patient is being transported to [facility] ER via wheelchair escorted by staff. Prior to discharge patient given Hydrocodone-acetaminophen (2) tablets. Patient is stable at discharge. Final Clinical Impressions(s) / UC Diagnoses   Final diagnoses:  Fever, unspecified fever cause  Right flank pain, rule out right renal stone     Discharge Instructions     Go immediately to [facility] Emergency Department to rule  out a possible obstructive renal stone. I suspect you also have an underlying kidney infection due to 3 days of fever.  Given your symptoms this could progress rather acutely and you need immediate ER evaluation. If there is an extended wait time at [facility], an alternative is to be seen at [facility],  [place].    ED Prescriptions    None     PDMP not reviewed this encounter.   Scot Jun, FNP 04/11/20 1157

## 2020-04-07 NOTE — ED Notes (Signed)
Patient is being discharged from the Urgent [facility] and sent to the Emergency Department via wheelchair by staff. Per Lavell Anchors, FNP, patient is stable but in need of higher level of care due to kidney stone. Patient is aware and verbalizes understanding of plan of care.  Vitals:   04/07/20 0916  BP: (!) 129/73  Pulse: 101  Resp: 16  Temp: 100.1 F (37.8 C)  SpO2: 98%

## 2020-04-07 NOTE — Hospital Course (Addendum)
Reports feeling a lot better. Reports only throwing up once last night, felt great all night otherwise. Had a BM. Discussed being sent home on oral antibiotics. Asked to have some antinausea medications sent home with her. Patient says she is ok with insulin at home and team stated that he will send her with metformin + lantis once at night.  Discussed follow up with PCP for further diabetic adjustment

## 2020-04-07 NOTE — ED Triage Notes (Signed)
Pt arrives from UC with concern for kidney stone. Pt with R sided flank pain X4 days with fevers.

## 2020-04-07 NOTE — ED Provider Notes (Signed)
[facility] Provider Note   CSN: [id number] Arrival date & time: 04/07/20  1036     History Chief Complaint  Patient presents with  . Flank Pain    Tamara Barnes is a 54 y.o. female with a past medical history of hypertension, DU B, who presents today for evaluation of fever, chills, and right-sided flank pain.  She was initially seen at urgent care and referred here for concern of nephrolithiasis.  She reports that 4 days ago she developed right flank pain and fevers.  She reports mild relief of her symptoms with Tylenol however this is short-lived.  She denies any nausea or vomiting.  No left-sided pain.  No anterior abdominal pain.  She denies any history of similar.  She reports that she feels like she still needs to urinate, however doesn't have any urine left. She reports developing dysuria today.  She is fully vaccinated against covid.  No vomiting, cough or diarrhea.     HPI     No past medical history on file.  Patient Active Problem List   Diagnosis Date Noted  . Type 2 diabetes mellitus without complication, without long-term current use of insulin ([place]) 04/07/2020  . Sinusitis 07/04/2012  . Elevated BP 07/04/2012  . Rhinitis medicamentosa 07/04/2012  . FIBROIDS, UTERUS 02/24/2009  . UNSPECIFIED ABNORMAL MAMMOGRAM 02/24/2009  . 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 ANEMIA 06/04/2008  . SCIATICA 06/04/2008  . TOBACCO DEPENDENCE 11/10/2006    Past Surgical History:  Procedure Laterality Date  . tuabl ligation       OB History   No obstetric history on file.     Family History  Problem Relation Age of Onset  . Kidney failure Mother   . Heart disease Mother   . Liver disease Father     Social History   Tobacco Use  . Smoking status: Current Every Day Smoker    Packs/day: 0.25    Types: Cigarettes  . Smokeless tobacco: Never Used  Substance Use Topics  . Alcohol use: Yes    Comment: socially  .  Drug use: No    Home Medications Prior to Admission medications   Medication Sig Start Date End Date Taking? Authorizing Provider  acetaminophen (TYLENOL) 500 MG tablet Take 1,500 mg by mouth every 6 (six) hours as needed. For pain    [provider]  acetaminophen-codeine (TYLENOL #3) 300-30 MG tablet Take 1-2 tablets by mouth every 6 (six) hours as needed for moderate pain. 06/23/16   Glendell Docker, NP  cephALEXin (KEFLEX) 500 MG capsule Take 1 capsule (500 mg total) by mouth 4 (four) times daily. 09/05/16   Ashley Murrain, NP  cetirizine (ZYRTEC ALLERGY) 10 MG tablet Take 1 tablet (10 mg total) by mouth daily. 06/11/12   Powers, Lonia Farber, MD  clindamycin (CLEOCIN) 150 MG capsule Take 2 capsules (300 mg total) by mouth every 6 (six) hours. 09/11/17   Carlisle Cater, PA-C  fluticasone (FLONASE) 50 MCG/ACT nasal spray Place 2 sprays into the nose daily. 07/04/12   Luetta Nutting, DO  fluticasone (FLONASE) 50 MCG/ACT nasal spray Place 1 spray into both nostrils daily. 06/21/16   Dowless, Samantha Tripp, PA-C  ibuprofen (ADVIL,MOTRIN) 800 MG tablet Take 1 tablet (800 mg total) by mouth 3 (three) times daily. 09/11/17   Carlisle Cater, PA-C  magic mouthwash w/lidocaine SOLN Take 10 mLs by mouth 3 (three) times daily as needed for mouth pain. 08/05/15   Street, [place], PA-C  naproxen (  NAPROSYN) 500 MG tablet Take 1 tablet (500 mg total) by mouth 2 (two) times daily. 09/05/16   Ashley Murrain, NP  sodium chloride (OCEAN) 0.65 % nasal spray Place 1 spray into the nose as needed for congestion. 06/11/12   Powers, Lonia Farber, MD    Allergies    Patient has no known allergies.  Review of Systems   Review of Systems  Constitutional: Positive for chills, fatigue and fever.  HENT: Negative for congestion.   Eyes: Negative for visual disturbance.  Respiratory: Negative for cough, chest tightness and shortness of breath.   Cardiovascular: Negative for chest pain.  Gastrointestinal: Negative  for abdominal pain, nausea and vomiting.  Genitourinary: Positive for dysuria and flank pain. Negative for difficulty urinating and urgency.  Musculoskeletal: Negative for back pain and neck pain.  Skin: Negative for color change and rash.  Neurological: Negative for weakness and headaches.  All other systems reviewed and are negative.   Physical Exam Updated Vital Signs BP (!) 143/99 (BP Location: Right Arm)   Pulse 89   Temp 98.5 F (36.9 C) (Oral)   Resp 18   LMP 01/14/2015   SpO2 93%   Physical Exam Vitals and nursing note reviewed.  Constitutional:      General: She is not in acute distress.    Appearance: She is well-developed. She is not diaphoretic.  HENT:     Head: Normocephalic and atraumatic.  Eyes:     General: No scleral icterus.       Right eye: No discharge.        Left eye: No discharge.     Conjunctiva/sclera: Conjunctivae normal.  Cardiovascular:     Rate and Rhythm: Normal rate and regular rhythm.     Pulses: Normal pulses.     Heart sounds: Normal heart sounds.  Pulmonary:     Effort: Pulmonary effort is normal. No respiratory distress.     Breath sounds: No stridor.  Abdominal:     General: There is no distension.     Tenderness: There is no abdominal tenderness. There is right CVA tenderness.  Musculoskeletal:        General: No deformity.     Cervical back: Normal range of motion and neck supple. No rigidity.  Skin:    General: Skin is warm and dry.  Neurological:     General: No focal deficit present.     Mental Status: She is alert.     Motor: No abnormal muscle tone.  Psychiatric:        Mood and Affect: Mood normal.        Behavior: Behavior normal.     ED Results / Procedures / Treatments   Labs (all labs ordered are listed, but only abnormal results are displayed) Labs Reviewed  COMPREHENSIVE METABOLIC PANEL - Abnormal; Notable for the following components:      Result Value   Sodium 127 (*)    Chloride 93 (*)    Glucose, Bld  398 (*)    Albumin 2.9 (*)    All other components within normal limits  CBC - Abnormal; Notable for the following components:   WBC 12.2 (*)    All other components within normal limits  URINALYSIS, ROUTINE W REFLEX MICROSCOPIC - Abnormal; Notable for the following components:   APPearance CLOUDY (*)    Glucose, UA >=500 (*)    Hgb urine dipstick SMALL (*)    Ketones, ur 20 (*)    Protein, ur 100 (*)  Leukocytes,Ua MODERATE (*)    WBC, UA >50 (*)    Bacteria, UA MANY (*)    All other components within normal limits  HEMOGLOBIN A1C - Abnormal; Notable for the following components:   Hgb A1c MFr Bld 11.0 (*)    All other components within normal limits  CULTURE, BLOOD (ROUTINE X 2)  CULTURE, BLOOD (ROUTINE X 2)  URINE CULTURE  SARS CORONAVIRUS 2 BY RT PCR (HOSPITAL ORDER, [facility])  LIPASE, BLOOD  APTT  PROTIME-INR  LACTIC ACID, PLASMA  LACTIC ACID, PLASMA    EKG None  Radiology DG Chest Port 1 View  Result Date: 04/07/2020 CLINICAL DATA:  Sepsis.  Fever EXAM: PORTABLE CHEST 1 VIEW COMPARISON:  None. FINDINGS: Mild bibasilar airspace disease. No effusion most likely atelectasis or heart failure. Cardiac and mediastinal contours are normal. Lungs otherwise clear. IMPRESSION: Mild bibasilar airspace disease 4444 mild, probable atelectasis Electronically Signed   By: Franchot Gallo M.D.   On: 04/07/2020 16:22   CT Renal Stone Study  Result Date: 04/07/2020 CLINICAL DATA:  RIGHT flank pain EXAM: CT ABDOMEN AND PELVIS WITHOUT CONTRAST TECHNIQUE: Multidetector CT imaging of the abdomen and pelvis was performed following the standard protocol without IV contrast. COMPARISON:  None. FINDINGS: Lower chest: RIGHT basilar atelectasis Hepatobiliary: Focal fatty deposition adjacent to the falciform ligament. No gallstones, gallbladder wall thickening, or biliary dilatation. Pancreas: Unremarkable. No pancreatic ductal dilatation or surrounding inflammatory  changes. Spleen: Normal in size without focal abnormality. Adrenals/Urinary Tract: There is a 3.6 cm hypodense mass of the RIGHT adrenal gland with Hounsfield units measuring less than 10 consistent with an adenoma. No obstructing nephrolithiasis visualized. There is no hydronephrosis. There is trace fluid/fat stranding adjacent the RIGHT kidney. Bladder is unremarkable and decompressed. Stomach/Bowel: Stomach is within normal limits. Appendix appears normal. No evidence of bowel wall thickening, distention, or inflammatory changes. Vascular/Lymphatic: No significant vascular findings are present. No enlarged abdominal or pelvic lymph nodes. Reproductive: Uterus and bilateral adnexa are unremarkable. Other: No abdominal wall hernia or abnormality. No abdominopelvic ascites. Musculoskeletal: Mild multilevel degenerative changes of the lumbar spine. IMPRESSION: 1. Trace fat stranding adjacent to the RIGHT kidney is nonspecific. This could reflect pyelonephritis in the appropriate clinical setting. Recommend correlation with urinalysis. 2.  No obstructing nephrolithiasis visualized. Electronically Signed   By: Valentino Saxon MD   On: 04/07/2020 13:12    Procedures Procedures (including critical care time)   Medications Ordered in ED Medications  sodium chloride flush (NS) 0.9 % injection 3 mL (has no administration in time range)  cefTRIAXone (ROCEPHIN) 1 g in sodium chloride 0.9 % 100 mL IVPB (1 g Intravenous New Bag/Given 04/07/20 1714)  lactated ringers bolus 1,000 mL (has no administration in time range)    ED Course  I have reviewed the triage vital signs and the nursing notes.  Pertinent labs & imaging results that were available during my care of the patient were reviewed by me and considered in my medical decision making (see chart for details).  Clinical Course as of Apr 07 1849  Mon Apr 07, 2020  1740 Was informed by RN that patient is febrile at 102.8.  Given leukocytosis in setting  of pyelo code sepsis officially called.  She has already had antibiotics and all her sepsis based labs.      [EH]  2778 I spoke with IMTS who will come see patient.    [EH]    Clinical Course User Index [EH] Wyn Quaker  Ardeth Perfect   MDM Rules/Calculators/A&P                         Patient is a 54 year old woman who presents today for evaluation from urgent care for evaluation of right-sided flank pain with fevers and chills.  At urgent care her temperature was 100.1.  She reportedly had a fever of 101.3 on Saturday.  Here she is afebrile, not tachycardic or tachypneic.  CT renal study was obtained due to concern for stone at urgent care showing stranding around the right kidney.  Her UA is concerning for infection with many bacteria, over 50 white cells with white blood cell clumps and blood.  Additionally her UA has over 500 glucose.  Her CBG is significantly elevated at 398.  She denies any recent steroid use.  A1c was obtained and is over 11, representing a new diagnosis of DM.  IV Rocephin and fluids are ordered.  She does not meet criteria for sepsis or 30 mL/kg fluid bolus at this time is she is not hypotensive, initial lactic acid is still pending.  She is fully vaccinated against Covid, Covid test is still pending however doubt COVID-19.  Given new diagnosis of diabetes in the setting of pyelonephritis recommend admission.  I spoke with IMTS who will see the patient for admission.   Note: Portions of this report may have been transcribed using voice recognition software. Every effort was made to ensure accuracy; however, inadvertent computerized transcription errors may be present   Final Clinical Impression(s) / ED Diagnoses Final diagnoses:  Pyelonephritis  Type 2 diabetes mellitus without complication, without long-term current use of insulin [facility])    Rx / [place] Orders ED Discharge Orders    None       Ollen Gross 04/07/20 1852    Valarie Merino,  MD 04/10/20 1752

## 2020-04-07 NOTE — ED Triage Notes (Signed)
Pt is here with right flank pain that started 4 days ago with on & off chills. She had a fever of 101.3 on Saturday. Pt has taken Tylenol to relieve discomfort.

## 2020-04-07 NOTE — Discharge Instructions (Addendum)
Go immediately to [facility] Emergency Department to rule out a possible obstructive renal stone. I suspect you also have an underlying kidney infection due to 3 days of fever.  Given your symptoms this could progress rather acutely and you need immediate ER evaluation. If there is an extended wait time at [facility], an alternative is to be seen at [facility],  [place].

## 2020-04-07 NOTE — H&P (Signed)
Date: 04/07/2020               Patient Name:  Tamara Barnes MRN: [id number]  DOB: 05/19/1966 Age / Sex: 54 y.o., female   PCP: System, Pcp Not In         Medical Service: Internal Medicine Teaching Service         Attending Physician: Dr. Velna Ochs, MD    First Contact: Dr. Virl Axe MD Pager: [phone]  Second Contact: Dr. Lonia Skinner MD Pager: [phone]       After Hours (After 5p/  First Contact Pager: [phone]  weekends / holidays): Second Contact Pager: [phone]   Chief Complaint: fever, chills, R-sided flank pain  History of Present Illness:  Tamara Barnes is a 54 year old female with no reported medical history presenting with a 4-day history of fever, chills, and right-sided flank pain. She initially went to urgent care and was referred here d/t concern for nephrolithiasis. She reports that she developed fevers this past Thursday (04/03/20) and obtained relief of symptoms with Tylenol. However, on Friday morning (04/04/20), her fever recurred and she began having associated right-sided flank pain. She tried taking tylenol again for her symptoms, but this time only obtained mild relief for a short period of time. States that during these 4 days, she has not been able to tolerate any food intake, except for some cake and ice cream on Saturday. She states that she has been having increasing urinary frequency and pain with urination, both of which began over the weekend. Reports feeling thirsty all the time, even after drinking water or juice. She does endorse subjective fevers and chills. Reports not being able to pass a bowel movement in the past 4 days because she hasn't been able to eat anything, but is able to pass gas. Also reports feeling weak since her fevers started.  Denies headache, nausea, vomiting, congestion, SOB, cough, chest pain, palpitations.  She went to UC today because she just felt like she was not getting better on her own. Reports  temperature at UC being 100.366F and getting oxycontin before coming to [facility]. Here at [facility], she was initially afebrile, but later developed fever of 102.66F. Found to have leukocytosis (12.2). Patient's sodium found to be 127, with glucose of 398. Lipase found to be wnl. CT renal study was obtained due to concern for nephrolithiasis at [facility], which showed stranding around the right kidney. UA showed infection with negative nitrites, moderate leuks, many bacteria, and WBCs >50. UA also showing glucosuria, ketonuria, and proteinuria. Urine and blood cx pending. Lactic acid of 1.7. HbA1c found to be 11%.    Meds: Current Meds  Medication Sig  . acetaminophen (TYLENOL) 500 MG tablet Take 1,500 mg by mouth every 8 (eight) hours as needed for mild pain or fever.    No home medications as per patient.   Allergies: Allergies as of 04/07/2020 - Review Complete 04/07/2020  Allergen Reaction Noted  . Oatmeal Shortness Of Breath and Other (See Comments) 04/07/2020  . Rice Shortness Of Breath, Swelling, and Other (See Comments) 04/07/2020  . Wheat bran Shortness Of Breath, Swelling, and Other (See Comments) 04/07/2020   No past medical history on file. Patient denies any past medical history. However: -Uterine fibroids seen on transvaginal U/S in 2009 -Dysfunctional uterine bleeding (in 2009)  Family History:  -Mother: CHF, HTN, HLD -Father: cirrhosis of the liver (alcohol abuse) -Younger sister: CHF  Social History:  Patient reports smoking a few cigarettes/day for the  past 40 years. Only drinks alcohol socially. No recreational drug use. Patient is unemployed at this time.  Review of Systems: A complete ROS was negative except as per HPI.   Physical Exam: Blood pressure (!) 143/99, pulse 89, temperature (!) 102.8 F (39.3 C), temperature source Oral, resp. rate 18, height 5\' 3"  (1.6 m), weight 62.1 kg, last menstrual period 01/14/2015, SpO2 93 %.  Physical Exam Constitutional:      Appearance:  Normal appearance. She is not ill-appearing.  HENT:     Head: Normocephalic and atraumatic.     Mouth/Throat:     Mouth: Mucous membranes are dry.  Eyes:     General: No scleral icterus.    Extraocular Movements: Extraocular movements intact.     Conjunctiva/sclera: Conjunctivae normal.     Pupils: Pupils are equal, round, and reactive to light.  Cardiovascular:     Rate and Rhythm: Normal rate and regular rhythm.     Pulses: Normal pulses.     Heart sounds: Normal heart sounds. No murmur heard.  No friction rub. No gallop.   Pulmonary:     Effort: Pulmonary effort is normal. No respiratory distress.     Breath sounds: Normal breath sounds. No stridor. No wheezing or rhonchi.  Abdominal:     General: Bowel sounds are normal. There is no distension.     Palpations: Abdomen is soft.     Tenderness: There is right CVA tenderness. There is no left CVA tenderness, guarding or rebound.     Comments: Tender to palpation in the RMQ with radiation to the right flank.  Musculoskeletal:        General: Normal range of motion.     Cervical back: Normal range of motion.     Right lower leg: No edema.     Left lower leg: No edema.  Skin:    General: Skin is warm and dry.  Neurological:     General: No focal deficit present.     Mental Status: She is alert and oriented to person, place, and time.  Psychiatric:        Mood and Affect: Mood normal.        Behavior: Behavior normal.        Thought Content: Thought content normal.        Judgment: Judgment normal.      EKG: personally reviewed my interpretation is normal sinus rhythm.  DG Chest Port 1 View Result Date: 04/07/2020 CLINICAL DATA:  Sepsis.  Fever EXAM: PORTABLE CHEST 1 VIEW COMPARISON:  None. FINDINGS: Mild bibasilar airspace disease. No effusion most likely atelectasis or heart failure. Cardiac and mediastinal contours are normal. Lungs otherwise clear. IMPRESSION: Mild bibasilar airspace disease 4444 mild, probable  atelectasis Electronically Signed   By: Franchot Gallo M.D.   On: 04/07/2020 16:22    CT Renal Stone Study Result Date: 04/07/2020 CLINICAL DATA:  RIGHT flank pain EXAM: CT ABDOMEN AND PELVIS WITHOUT CONTRAST TECHNIQUE: Multidetector CT imaging of the abdomen and pelvis was performed following the standard protocol without IV contrast. COMPARISON:  None. FINDINGS: Lower chest: RIGHT basilar atelectasis Hepatobiliary: Focal fatty deposition adjacent to the falciform ligament. No gallstones, gallbladder wall thickening, or biliary dilatation. Pancreas: Unremarkable. No pancreatic ductal dilatation or surrounding inflammatory changes. Spleen: Normal in size without focal abnormality. Adrenals/Urinary Tract: There is a 3.6 cm hypodense mass of the RIGHT adrenal gland with Hounsfield units measuring less than 10 consistent with an adenoma. No obstructing nephrolithiasis visualized. There is no hydronephrosis.  There is trace fluid/fat stranding adjacent the RIGHT kidney. Bladder is unremarkable and decompressed. Stomach/Bowel: Stomach is within normal limits. Appendix appears normal. No evidence of bowel wall thickening, distention, or inflammatory changes. Vascular/Lymphatic: No significant vascular findings are present. No enlarged abdominal or pelvic lymph nodes. Reproductive: Uterus and bilateral adnexa are unremarkable. Other: No abdominal wall hernia or abnormality. No abdominopelvic ascites. Musculoskeletal: Mild multilevel degenerative changes of the lumbar spine. IMPRESSION: 1. Trace fat stranding adjacent to the RIGHT kidney is nonspecific. This could reflect pyelonephritis in the appropriate clinical setting. Recommend correlation with urinalysis. 2.  No obstructing nephrolithiasis visualized. Electronically Signed   By: Valentino Saxon MD   On: 04/07/2020 13:12   Assessment & Plan by Problem: Active Problems:   Type 2 diabetes mellitus without complication, without long-term current use of insulin  ([place])   Pyelonephritis  Patient is a 54 year old female with no reported medical history presenting with 4-day history of fevers, chills, right-sided flank pain, and dysuria. Leukocytosis, R CVA tenderness, and UA suggestive of acute pyelonephritis. Patient also has significant hyperglycemia with HbA1c of 11%, consistent with newly diagnosed T2DM.  Acute Pyelonephritis: Presenting with fever (102.8 in ED), chills, dysuria, R CVA tenderness, leukocytosis (12.2). UA showing negative nitrites, moderate leukocytes, many bacteria, WBCs >50.  -Lipase wnl -Lactic acid 1.7 -CXR showing only mild atelectasis -CT renal stone study negative for nephrolithiasis, but showing trace fat stranding adjacent to R kidney which is suggestive of R-sided acute pyelonephritis along with symptomology and UA results -Urine cx pending -Blood cx pending -Tylenol q6h prn for fevers and mild pain -Day 1 of IV Rocephin -Repeat CBC and BMP in AM  Hyponatremia: Sodium 127. Calculated serum osm about 280. Could be hypertonic/hyperosmolar hyponatremia 2/2 hyperglycemia vs hypotonic/hypoosmolar hyponatremia 2/2 dehydration. -measured serum osm, urine osm, urine sodium --> all pending -TSH pending -Received 1L LR in ED -On maintenance IV fluids with NS 150cc/hr   Newly diagnosed T2DM: Glucose on admission 398. HbA1c 11%. UA showing significant glucosuria, ketonuria, and proteinuria. -CBG monitoring -Start SSI while here -Metformin at discharge  Dysfunctional uterine bleeding likely 2/2 uterine fibroids: In patient's past medical history, not causing any problems at this time. Uterine fibroids seen on transvaginal U/S in 2009. On current admission, Hgb 13.6. Stable.  Dispo: Admit patient to Observation with expected length of stay less than 2 midnights.  Signed: Virl Axe, MD 04/07/2020, 6:27 PM  Pager: [phone] After 5pm on weekdays and 1pm on weekends: On Call pager: [phone]

## 2020-04-08 ENCOUNTER — Encounter (HOSPITAL_COMMUNITY): Payer: Self-pay | Admitting: Internal Medicine

## 2020-04-08 DIAGNOSIS — Z91018 Allergy to other foods: Secondary | ICD-10-CM | POA: Diagnosis not present

## 2020-04-08 DIAGNOSIS — Z20822 Contact with and (suspected) exposure to covid-19: Secondary | ICD-10-CM | POA: Diagnosis present

## 2020-04-08 DIAGNOSIS — F17210 Nicotine dependence, cigarettes, uncomplicated: Secondary | ICD-10-CM | POA: Diagnosis present

## 2020-04-08 DIAGNOSIS — N10 Acute tubulo-interstitial nephritis: Secondary | ICD-10-CM | POA: Diagnosis present

## 2020-04-08 DIAGNOSIS — Z841 Family history of disorders of kidney and ureter: Secondary | ICD-10-CM | POA: Diagnosis not present

## 2020-04-08 DIAGNOSIS — J302 Other seasonal allergic rhinitis: Secondary | ICD-10-CM | POA: Diagnosis present

## 2020-04-08 DIAGNOSIS — R7881 Bacteremia: Secondary | ICD-10-CM

## 2020-04-08 DIAGNOSIS — R310 Gross hematuria: Secondary | ICD-10-CM | POA: Diagnosis present

## 2020-04-08 DIAGNOSIS — Z1611 Resistance to penicillins: Secondary | ICD-10-CM | POA: Diagnosis present

## 2020-04-08 DIAGNOSIS — I10 Essential (primary) hypertension: Secondary | ICD-10-CM | POA: Diagnosis present

## 2020-04-08 DIAGNOSIS — B9620 Unspecified Escherichia coli [E. coli] as the cause of diseases classified elsewhere: Secondary | ICD-10-CM | POA: Diagnosis present

## 2020-04-08 DIAGNOSIS — Z560 Unemployment, unspecified: Secondary | ICD-10-CM | POA: Diagnosis not present

## 2020-04-08 DIAGNOSIS — E876 Hypokalemia: Secondary | ICD-10-CM | POA: Diagnosis present

## 2020-04-08 DIAGNOSIS — Z79899 Other long term (current) drug therapy: Secondary | ICD-10-CM | POA: Diagnosis not present

## 2020-04-08 DIAGNOSIS — E1165 Type 2 diabetes mellitus with hyperglycemia: Secondary | ICD-10-CM | POA: Diagnosis present

## 2020-04-08 DIAGNOSIS — K5900 Constipation, unspecified: Secondary | ICD-10-CM | POA: Diagnosis not present

## 2020-04-08 LAB — CBC
HCT: 39.5 % (ref 36.0–46.0)
Hemoglobin: 12.6 g/dL (ref 12.0–15.0)
MCH: 29.2 pg (ref 26.0–34.0)
MCHC: 31.9 g/dL (ref 30.0–36.0)
MCV: 91.4 fL (ref 80.0–100.0)
Platelets: 153 10*3/uL (ref 150–400)
RBC: 4.32 MIL/uL (ref 3.87–5.11)
RDW: 12.1 % (ref 11.5–15.5)
WBC: 11.7 10*3/uL — ABNORMAL HIGH (ref 4.0–10.5)
nRBC: 0 % (ref 0.0–0.2)

## 2020-04-08 LAB — BLOOD CULTURE ID PANEL (REFLEXED)

## 2020-04-08 LAB — GLUCOSE, CAPILLARY
Glucose-Capillary: 229 mg/dL — ABNORMAL HIGH (ref 70–99)
Glucose-Capillary: 231 mg/dL — ABNORMAL HIGH (ref 70–99)
Glucose-Capillary: 162 mg/dL — ABNORMAL HIGH (ref 70–99)
Glucose-Capillary: 180 mg/dL — ABNORMAL HIGH (ref 70–99)

## 2020-04-08 LAB — BASIC METABOLIC PANEL
Anion gap: 12 (ref 5–15)
BUN: 12 mg/dL (ref 6–20)
CO2: 20 mmol/L — ABNORMAL LOW (ref 22–32)
Calcium: 8.8 mg/dL — ABNORMAL LOW (ref 8.9–10.3)
Chloride: 99 mmol/L (ref 98–111)
Creatinine, Ser: 0.82 mg/dL (ref 0.44–1.00)
GFR calc Af Amer: >60 mL/min (ref >60)
GFR calc non Af Amer: >60 mL/min (ref >60)
Glucose, Bld: 282 mg/dL — ABNORMAL HIGH (ref 70–99)
Potassium: 3.8 mmol/L (ref 3.5–5.1)
Sodium: 131 mmol/L — ABNORMAL LOW (ref 135–145)

## 2020-04-08 LAB — TSH: TSH: 0.79 u[IU]/mL (ref 0.350–4.500)

## 2020-04-08 LAB — HIV ANTIBODY (ROUTINE TESTING W REFLEX): HIV Screen 4th Generation wRfx: NONREACTIVE

## 2020-04-08 LAB — OSMOLALITY: Osmolality: 288 mOsm/kg (ref 275–295)

## 2020-04-08 MED ORDER — INSULIN ASPART 100 UNIT/ML ~~LOC~~ SOLN
5.0000 [IU] | Freq: Once | SUBCUTANEOUS | Status: AC
  Administered 2020-04-08: 5 [IU] via SUBCUTANEOUS

## 2020-04-08 MED ORDER — LIVING WELL WITH DIABETES BOOK
Freq: Once | Status: AC
  Filled 2020-04-08: qty 1

## 2020-04-08 MED ORDER — INSULIN STARTER KIT- PEN NEEDLES (ENGLISH)
1.0000 | Freq: Once | Status: AC
  Administered 2020-04-08: 1
  Filled 2020-04-08: qty 1

## 2020-04-08 MED ORDER — INSULIN GLARGINE 100 UNIT/ML ~~LOC~~ SOLN
10.0000 [IU] | Freq: Every day | SUBCUTANEOUS | Status: DC
  Administered 2020-04-08 – 2020-04-09 (×2): 10 [IU] via SUBCUTANEOUS
  Filled 2020-04-08 (×4): qty 0.1

## 2020-04-08 MED ORDER — ENSURE MAX PROTEIN PO LIQD
11.0000 [oz_av] | Freq: Two times a day (BID) | ORAL | Status: DC
  Administered 2020-04-08 – 2020-04-09 (×2): 11 [oz_av] via ORAL
  Filled 2020-04-08 (×6): qty 330

## 2020-04-08 MED ORDER — SODIUM CHLORIDE 0.9 % IV SOLN
2.0000 g | INTRAVENOUS | Status: DC
  Administered 2020-04-08 – 2020-04-10 (×3): 2 g via INTRAVENOUS
  Filled 2020-04-08 (×3): qty 20

## 2020-04-08 MED ORDER — HYDROCODONE-ACETAMINOPHEN 5-325 MG PO TABS
1.0000 | ORAL_TABLET | Freq: Four times a day (QID) | ORAL | Status: DC | PRN
  Administered 2020-04-08 – 2020-04-09 (×5): 1 via ORAL
  Filled 2020-04-08 (×5): qty 1

## 2020-04-08 MED ORDER — ONDANSETRON HCL 4 MG PO TABS
4.0000 mg | ORAL_TABLET | Freq: Once | ORAL | Status: AC
  Administered 2020-04-08: 4 mg via ORAL
  Filled 2020-04-08: qty 1

## 2020-04-08 NOTE — TOC Benefit Eligibility Note (Signed)
Transition of Care [facility]) Benefit Eligibility Note    Patient Details  Name: Tamara Barnes MRN: [id number] Date of Birth: [birth year]   Medication/Dose: LANTUS  nad  NOVOLOG  : NOT COVER /  NON-FORMULARY  P  Covered?: No     Prescription Coverage Preferred Pharmacy: [facility] with Person/Company/Phone Number:: [employer]  @   [employer] # [phone] OPT- 2     Prior Approval: Yes  Deductible:  (OUT-OF-POCKRT:UNMET)       [facility] Phone Number: 04/08/2020, 3:35 PM

## 2020-04-08 NOTE — Progress Notes (Addendum)
Inpatient Diabetes Program Recommendations  AACE/ADA: New Consensus Statement on Inpatient Glycemic Control (2015)  Target Ranges:  Prepandial:   less than 140 mg/dL      Peak postprandial:   less than 180 mg/dL (1-2 hours)      Critically ill patients:  140 - 180 mg/dL   Lab Results  Component Value Date   GLUCAP 229 (H) 04/08/2020   HGBA1C 11.0 (H) 04/07/2020    Review of Glycemic Control Results for ALINA, GILKEY (MRN [id number]) as of 04/08/2020 08:55  Ref. Range 04/07/2020 23:17 04/08/2020 01:03 04/08/2020 06:40 04/08/2020 08:15  Glucose-Capillary Latest Ref Range: 70 - 99 mg/dL 284 (H) Novolog 5 units 229 (H) Novolog 5 units   Diabetes history:  New DM2 Outpatient Diabetes medications:  None Current orders for Inpatient glycemic control:  Novolog 0-15 units tid with meals  Inpatient Diabetes Program Recommendations:     If CBG's remain >180 mg/dl might consider;  Lantus 10 units daily (0.15units/kg)  Will continue to follow while trends inpatient and educate on new diagnoses of DM2.  Addendum:  Spoke with pt at bedside.  Spoke with pt about new diagnosis. Discussed A1C results with her and explained what an A1C is, basic pathophysiology of DM Type 2, basic home care, basic diabetes diet nutrition principles, importance of checking CBGs and maintaining good CBG control to prevent long-term and short-term complications. Reviewed signs and symptoms of hyperglycemia and hypoglycemia and how to treat hypoglycemia at home. Also reviewed blood sugar goals at home.  RNs to provide ongoing basic DM education at bedside with this patient. Have ordered educational booklet, insulin starter kit, and DM videos. Have also placed RD consult for DM diet education for this patient.  Attached education to AVS on diabetes and insulin.    Patient states she drinks sweet tea, and does not watch her CHO intake but does not like bread.  We reviewed CHO's, foods that contain CHO's and goal CHO's of  50 grams per meal.  RD has educated patient as well.    A1C 11%.  Educated patient on insulin pen use at home. Reviewed contents of insulin flexpen starter kit. Reviewed all steps of insulin pen including attachment of needle, 2-unit air shot, dialing up dose, giving injection, removing needle, disposal of sharps, storage of unused insulin, disposal of insulin etc. Patient able to provide successful return demonstration. Also reviewed troubleshooting with insulin pen. MD to give patient Rxs for insulin pens and insulin pen needles.  She has several friends who have diabetes and are on insulin. If patient discharges on insulin, order#'s are below.  She would prefer insulin pens.  Lantus # [id number] Novolog # [id number] Pen needles # [id number] Glucometer # [id number]  Will need prescription for glucometer at bedside.  If patient dc's on insulin she has been educated to check CBG's AC & HS.  If CBG <14m/dl at bedtime she should have a snack with appx 15 grams.    Contacted TOC for insulin benefit check.  [facility]    Will continue to follow while inpatient.  Thank you, JReche Dixon RN, BSN Diabetes Coordinator Inpatient Diabetes Program [phone](team pager from 8a-5p)      Thank you, JReche Dixon RN, BSN Diabetes Coordinator Inpatient Diabetes Program [phone](team pager from 8a-5p)

## 2020-04-08 NOTE — Progress Notes (Signed)
PHARMACY - PHYSICIAN COMMUNICATION CRITICAL VALUE ALERT - BLOOD CULTURE IDENTIFICATION (BCID)  Tamara Barnes is an 54 y.o. female who presented to [facility] on 04/07/2020 with a chief complaint of R-sided flank pain x4d w/ fevers.  Assessment:  Started on ABX for UTI/pyelo, now w/ 1/4 bottles of blood cx growing E.coli.  Name of physician (or Provider) ContactedKoren Bound via secure chat  Current antibiotics: Rocephin  Changes to prescribed antibiotics recommended:  Will increase Rocephin to 2g IV Q24H for bacteremia coverage; IMTS to continue to evaluate clinical picture.  Wynona Neat, PharmD, BCPS  04/08/2020  8:04 AM

## 2020-04-08 NOTE — Progress Notes (Signed)
° °  Subjective: Examined patient at bedside.  Patient reports that she is doing well today. Still having some back pain however the pain medications have helped. She reports that she is feeling better then when she came in.   Paged by patient's nurse after rounds about nausea, ordered one time dose of PO zofran.  Objective:  Vital signs in last 24 hours: Vitals:   04/07/20 2300 04/07/20 2353 04/08/20 0434 04/08/20 1210  BP: (!) 139/76 (!) 158/91 111/66 (!) 165/86  Pulse: 91 92 79 83  Resp: 18 18 18 17   Temp: (!) 101 F (38.3 C) (!) 100.7 F (38.2 C) 99 F (37.2 C) 98.8 F (37.1 C)  TempSrc:  Oral Oral Oral  SpO2: 95% 96% 100% 96%  Weight:  63.5 kg    Height:  5\' 3"  (1.6 m)     Physical:  General: Middle-aged female, lying in bed, NAD. CV: Normal rate and regular rhythm. No m/r/g Pulm: CTABL, no adventitious sounds noted. Abdomen: Soft, non-distended. Non-tender to palpation in all quadrants today. Normoactive bowel sounds. Still has R-sided CVA tenderness, although improved since yesterday. Neuro: No focal deficits present.  Assessment/Plan:  Active Problems:   Type 2 diabetes mellitus without complication, without long-term current use of insulin ([place])   Pyelonephritis  Patient is a 54 year old female with no reported medical history presenting with 4-day history of fevers, chills, right-sided flank pain, and dysuria. Leukocytosis, R CVA tenderness, and UA suggestive of acute pyelonephritis. Patient also has significant hyperglycemia with HbA1c of 11%, consistent with newly diagnosed T2DM.  Acute Pyelonephritis: E coli bacteremia (2/4 bottles): Presenting with fever (102.8 in ED), chills, dysuria, R CVA tenderness, leukocytosis (12.2). UA showing negative nitrites, moderate leukocytes, many bacteria, WBCs >50.  -Lipase wnl -Lactic acid 1.7 -CXR showing only mild atelectasis -CT renal stone study negative for nephrolithiasis, but showing trace fat stranding adjacent to R  kidney which is suggestive of R-sided acute pyelonephritis along with symptomology and UA results -Urine culture showing >100,000 GNR -Blood cultures showing 2/4 bottles positive for E coli -Remaining blood cultures still pending -Norco 5-382mcg prn for pain -Repeat CBC and BMP in AM -WBCs 12.2 --> 11.7 -Day 2 of IV Rocephin (increased dose to IV 2g q24h) -Will send home with 5-day course of PO augmentin (for a total 7-day course of abx)  Hyponatremia: At admission, sodium 127. Could be pseudohyponatremia 2/2 hyperglycemia vs hypotonic hyponatremia 2/2 volume depletion. Received 1L LR in ED. -Na 127 --> 131 -measured serum osm 288 -urine osm, urine sodium --> all pending -TSH wnl -On maintenance IV fluids with NS 150cc/hr   Newly diagnosed T2DM: Glucose on admission 398. HbA1c 11%. UA showing significant glucosuria, ketonuria, and proteinuria. -CBG monitoring -Continue SSI while here -Metformin along with likely oral DPP-4 inhibitor at discharge -Avoid SGLT-2 inhibitor d/t UTI  Dysfunctional uterine bleeding likely 2/2 uterine fibroids: In patient's past medical history, not causing any problems at this time. Uterine fibroids seen on transvaginal U/S in 2009. On current admission, Hgb 13.6. Stable.  Prior to Admission Living Arrangement: Home Anticipated Discharge Location: Home Barriers to Discharge: continued medical management Dispo: Anticipated discharge in approximately 0-1 day(s).   Virl Axe, MD 04/08/2020, 2:24 PM Pager: [phone] After 5pm on weekdays and 1pm on weekends: On Call pager [phone]

## 2020-04-08 NOTE — Progress Notes (Addendum)
Initial Nutrition Assessment  DOCUMENTATION CODES:   Not applicable  INTERVENTION:  Ensure Max po BID, each supplement provides 150 kcal and 30 grams of protein  Provided diabetic diet education  NUTRITION DIAGNOSIS:   Inadequate oral intake related to poor appetite as evidenced by per patient/family report.    GOAL:   Patient will meet greater than or equal to 90% of their needs    MONITOR:   PO intake, Supplement acceptance, Weight trends, Labs, I & O's  REASON FOR ASSESSMENT:   Consult Diet education  ASSESSMENT:   Pt with reportedly no PMH admitted with newly diagnosed type 2 DM and pyelonephritis.  Discussed pt with RN.   Pt states that she has not been able to keep anything down over the last 4 days, but reports that her appetite was strong prior to that point. Pt states she was eating 3 balanced meals per day. Pt does report some intentional wt loss over the last couple of years due to following a low carbohydrate diet.  Per wt readings in chart, pt lost 14.5% of her bodyweight since 08/27/2018, which is insignificant for time frame.   RD consulted for nutrition education regarding diabetes. Provided "Carbohydrate Counting for People with Diabetes" handout from the Academy of Nutrition and Dietetics. Discussed different food groups and their effects on blood sugar, emphasizing carbohydrate-containing foods. Provided list of carbohydrates and recommended serving sizes of common foods. Discussed importance of controlled and consistent carbohydrate intake throughout the day. Provided examples of ways to balance meals/snacks and encouraged intake of high-fiber, whole grain complex carbohydrates. Teach back method used.  No PO intake documented.   Labs: Na 131 (L), CBGs 229-284 Medications: Novolog  NUTRITION - FOCUSED PHYSICAL EXAM:  Deferred, pt began vomiting.   Diet Order:   Diet Order            Diet heart healthy/carb modified Room service appropriate?  Yes; Fluid consistency: Thin  Diet effective now                 EDUCATION NEEDS:   Education needs have been addressed  Skin:  Skin Assessment: Reviewed RN Assessment  Last BM:  PTA  Height:   Ht Readings from Last 1 Encounters:  04/07/20 5\' 3"  (1.6 m)    Weight:   Wt Readings from Last 1 Encounters:  04/07/20 63.5 kg    BMI:  Body mass index is 24.8 kg/m.  Estimated Nutritional Needs:   Kcal:  1600-1800  Protein:  80-90 grams  Fluid:  >/= 1.6L/d    Larkin Ina, MS, RD, LDN RD pager number and weekend/on-call pager number located in [place].

## 2020-04-09 ENCOUNTER — Inpatient Hospital Stay (HOSPITAL_COMMUNITY): Payer: 59

## 2020-04-09 DIAGNOSIS — R109 Unspecified abdominal pain: Secondary | ICD-10-CM

## 2020-04-09 LAB — URINE CULTURE
Culture: >=100000 — AB
Culture: >=100000 — AB

## 2020-04-09 LAB — COMPREHENSIVE METABOLIC PANEL
ALT: 13 U/L (ref 0–44)
AST: 14 U/L — ABNORMAL LOW (ref 15–41)
Albumin: 2.3 g/dL — ABNORMAL LOW (ref 3.5–5.0)
Alkaline Phosphatase: 81 U/L (ref 38–126)
Anion gap: 15 (ref 5–15)
BUN: 9 mg/dL (ref 6–20)
CO2: 22 mmol/L (ref 22–32)
Calcium: 9 mg/dL (ref 8.9–10.3)
Chloride: 96 mmol/L — ABNORMAL LOW (ref 98–111)
Creatinine, Ser: 0.63 mg/dL (ref 0.44–1.00)
GFR calc Af Amer: >60 mL/min (ref >60)
GFR calc non Af Amer: >60 mL/min (ref >60)
Glucose, Bld: 227 mg/dL — ABNORMAL HIGH (ref 70–99)
Potassium: 3.3 mmol/L — ABNORMAL LOW (ref 3.5–5.1)
Sodium: 133 mmol/L — ABNORMAL LOW (ref 135–145)
Total Bilirubin: 0.7 mg/dL (ref 0.3–1.2)
Total Protein: 6.1 g/dL — ABNORMAL LOW (ref 6.5–8.1)

## 2020-04-09 LAB — GLUCOSE, CAPILLARY
Glucose-Capillary: 217 mg/dL — ABNORMAL HIGH (ref 70–99)
Glucose-Capillary: 191 mg/dL — ABNORMAL HIGH (ref 70–99)
Glucose-Capillary: 157 mg/dL — ABNORMAL HIGH (ref 70–99)
Glucose-Capillary: 141 mg/dL — ABNORMAL HIGH (ref 70–99)

## 2020-04-09 LAB — CBC
HCT: 38.5 % (ref 36.0–46.0)
Hemoglobin: 12.5 g/dL (ref 12.0–15.0)
MCH: 29.1 pg (ref 26.0–34.0)
MCHC: 32.5 g/dL (ref 30.0–36.0)
MCV: 89.5 fL (ref 80.0–100.0)
Platelets: 171 10*3/uL (ref 150–400)
RBC: 4.3 MIL/uL (ref 3.87–5.11)
RDW: 11.9 % (ref 11.5–15.5)
WBC: 10.2 10*3/uL (ref 4.0–10.5)
nRBC: 0 % (ref 0.0–0.2)

## 2020-04-09 LAB — LIPASE, BLOOD: Lipase: 52 U/L — ABNORMAL HIGH (ref 11–51)

## 2020-04-09 MED ORDER — POTASSIUM CHLORIDE 10 MEQ/100ML IV SOLN
10.0000 meq | INTRAVENOUS | Status: DC
  Administered 2020-04-09: 10 meq via INTRAVENOUS
  Filled 2020-04-09: qty 100

## 2020-04-09 MED ORDER — POTASSIUM CHLORIDE CRYS ER 20 MEQ PO TBCR
40.0000 meq | EXTENDED_RELEASE_TABLET | Freq: Two times a day (BID) | ORAL | Status: DC
  Administered 2020-04-09 – 2020-04-10 (×3): 40 meq via ORAL
  Filled 2020-04-09 (×3): qty 2

## 2020-04-09 MED ORDER — LACTATED RINGERS IV BOLUS
500.0000 mL | Freq: Once | INTRAVENOUS | Status: AC
  Administered 2020-04-09: 500 mL via INTRAVENOUS

## 2020-04-09 MED ORDER — PROMETHAZINE HCL 25 MG/ML IJ SOLN
12.5000 mg | Freq: Four times a day (QID) | INTRAMUSCULAR | Status: DC | PRN
  Administered 2020-04-09 (×2): 12.5 mg via INTRAVENOUS
  Filled 2020-04-09 (×2): qty 1

## 2020-04-09 MED ORDER — ONDANSETRON HCL 4 MG/2ML IJ SOLN
4.0000 mg | Freq: Once | INTRAMUSCULAR | Status: AC
  Administered 2020-04-09: 4 mg via INTRAVENOUS
  Filled 2020-04-09: qty 2

## 2020-04-09 MED ORDER — ONDANSETRON HCL 4 MG PO TABS: 4.0000 mg | ORAL_TABLET | Freq: Once | ORAL | Status: DC

## 2020-04-09 MED ORDER — POLYETHYLENE GLYCOL 3350 17 G PO PACK
17.0000 g | PACK | Freq: Every day | ORAL | Status: DC
  Administered 2020-04-10: 17 g via ORAL
  Filled 2020-04-09: qty 1

## 2020-04-09 NOTE — Plan of Care (Signed)
  Problem: Education: Goal: Knowledge of General Education information will improve Description Including pain rating scale, medication(s)/side effects and non-pharmacologic comfort measures Outcome: Progressing   

## 2020-04-09 NOTE — Consult Note (Signed)
   [facility] CM Inpatient Consult   04/09/2020  Imara Standiford August 19, 1966 [id number]   Referral received for [facility] CM) services for follow up reference new patient diagnosis of diabetes. Spoke with patient by telephone. HIPAA verified.  Explained [facility] CM services as outpatient program assisting patients with chronic disease management and assistance with community needs. Patient verbalized understanding. Patient states that her primary practice provider is [facility]. No visits noted in chart review. Most recent noted visit is to Patient [place].   Patient verbally consents to post hospital follow up with community [facility] CM RN for assessment of needs. Referral placed to [facility] CM for follow up. Of note, [facility] Care Management services does not replace or interfere with any services that are arranged by inpatient case management or social work.  Netta Cedars, MSN, [facility] Liaison Nurse Mobile Phone [phone]  Toll free office [phone]

## 2020-04-09 NOTE — Discharge Summary (Signed)
Name: Tamara Barnes MRN: [id number] DOB: 09/15/1965 54 y.o. PCP: System, Pcp Not In  Date of Admission: 04/07/2020 11:49 AM Date of Discharge: 04/10/2020 Attending Physician: Velna Ochs, MD   Discharge Diagnosis: 1. E. coli bacteremia 2/2 Acute Pyelonephritis  2. Newly diagnosed Type II DM  Discharge Medications: Allergies as of 04/10/2020      Reactions   Oatmeal Shortness Of Breath, Other (See Comments)   Tongue swells   Rice Shortness Of Breath, Swelling, Other (See Comments)   Tongue swells   Wheat Bran Shortness Of Breath, Swelling, Other (See Comments)   Tongue swells      Medication List    STOP taking these medications   acetaminophen 500 MG tablet Commonly known as: TYLENOL   acetaminophen-codeine 300-30 MG tablet Commonly known as: TYLENOL #3   cephALEXin 500 MG capsule Commonly known as: KEFLEX   cetirizine 10 MG tablet Commonly known as: ZyrTEC Allergy   clindamycin 150 MG capsule Commonly known as: CLEOCIN   fluticasone 50 MCG/ACT nasal spray Commonly known as: FLONASE   ibuprofen 800 MG tablet Commonly known as: ADVIL   magic mouthwash w/lidocaine Soln   naproxen 500 MG tablet Commonly known as: NAPROSYN   sodium chloride 0.65 % nasal spray Commonly known as: OCEAN     TAKE these medications   cefUROXime 500 MG tablet Commonly known as: CEFTIN Take 1 tablet (500 mg total) by mouth 2 (two) times daily with a meal for 3 days.   HYDROcodone-acetaminophen 5-325 MG tablet Commonly known as: NORCO/VICODIN Take 1 tablet by mouth every 6 (six) hours as needed for up to 3 days for severe pain.   metFORMIN 500 MG tablet Commonly known as: Glucophage Take 1 tablet (500 mg total) by mouth 2 (two) times daily with a meal.   NovoLIN 70/30 FlexPen Relion (70-30) 100 UNIT/ML KwikPen Generic drug: insulin isophane & regular human Inject 5 Units into the skin 2 (two) times daily with a meal.       Disposition and follow-up:     [name] was discharged from [facility] in [place] condition.  At the hospital follow up visit please address:  1.  E coli bacteremia 2/2 acute pyelonephritis: Received 4 days of IV rocephin. Blood cultures showing E coli (2/4). Sensitivities showed resistance to ampicillin and ampicillin/sulbactam. Discharged home with 3 days of PO Cefuroxime 500mg  BID (last dose on August 1st, 2021). Discharged home with 3 days of PO Vicodin q6h prn for severe pain. F/u with PCP in 1-2 weeks.  Newly diagnosed T2DM: Glucose 398 on admission. HbA1c 11%. Discharged home with Metformin 500mg  BID and Novolin 70/30 FlexPen 5 units twice daily with meals. Will f/u with PCP in one week. Recommend avoiding SGLT-2 inhibitors moving forward given current admission for acute pyelonephritis.  2.  Labs / imaging needed at time of follow-up: CBC, BMP, UA in 6 weeks, repeat blood cultures  3.  Pending labs/ test needing follow-up: None   Follow-up Appointments: -f/u with PCP in 1-2 weeks    Hospital Course by problem list: E coli Bacteremia 2/2 Acute Pyelonephritis:   Presented with fever, chills, dysuria, R CVA tenderness, and leukocytosis (12.2). UA negative nitrites, moderate leukocytes, many bacteria, WBCs>50. CT renal stone study suggestive of R-sided acute pyelonephritis. Urine cultures showed >100,000 GNR. Blood cultures positive for E. Coli (2/4). Sensitivities showed resistance to Ampicillin and Ampicillin/Sulbactam. Received 4 days of IV Rocephin. Patient now afebrile with no leukocytosis. Discharged home with 3 days of  PO Cefuroxime 500mg  BID to finish 7-day course of antibiotics (last dose on 04/13/20). Discharged home with 3 days of Vicodin q6h prn for severe pain.   T2DM: Glucose 398 on admission. HbA1c 11%. UA showing significant glucosuria, ketonuria, and proteinuria. Improving blood glucose levels since admission. Dscharged with metformin 500mg  BID and Levemir flexpen 10 units QHS.  Received education from diabetes coordinator in regards to using flexpen. Will f/u with PCP in 1-2 weeks.  Constipation: KUB showed large amount of stool in colon. Given miralax with subsequent bowel movement and improvement of symptoms. Patient states that she takes milk of magnesia at home. Informed her to use OTC miralax as needed.   Discharge Vitals:   BP (!) 160/89 (BP Location: Left Arm)   Pulse 69   Temp 98.7 F (37.1 C) (Oral)   Resp 18   Ht 5\' 3"  (1.6 m)   Wt 63.5 kg   LMP 01/14/2015   SpO2 97%   BMI 24.80 kg/m    Pertinent Labs, Studies, and Procedures:  CBC Latest Ref Rng & Units 04/09/2020 04/08/2020 04/07/2020  WBC 4.0 - 10.5 K/uL 10.2 11.7(H) 12.2(H)  Hemoglobin 12.0 - 15.0 g/dL 12.5 12.6 13.6  Hematocrit 36 - 46 % 38.5 39.5 42.5  Platelets 150 - 400 K/uL 171 153 169   CMP Latest Ref Rng & Units 04/09/2020 04/08/2020 04/07/2020  Glucose 70 - 99 mg/dL 227(H) 282(H) 398(H)  BUN 6 - 20 mg/dL 9 12 11   Creatinine 0.44 - 1.00 mg/dL 0.63 0.82 0.96  Sodium 135 - 145 mmol/L 133(L) 131(L) 127(L)  Potassium 3.5 - 5.1 mmol/L 3.3(L) 3.8 3.9  Chloride 98 - 111 mmol/L 96(L) 99 93(L)  CO2 22 - 32 mmol/L 22 20(L) 22  Calcium 8.9 - 10.3 mg/dL 9.0 8.8(L) 9.2  Total Protein 6.5 - 8.1 g/dL 6.1(L) - 7.4  Total Bilirubin 0.3 - 1.2 mg/dL 0.7 - 1.2  Alkaline Phos 38 - 126 U/L 81 - 91  AST 15 - 41 U/L 14(L) - 17  ALT 0 - 44 U/L 13 - 15   Urinalysis    Component Value Date/Time   COLORURINE YELLOW 04/07/2020 1155   APPEARANCEUR CLOUDY (A) 04/07/2020 1155   LABSPEC 1.023 04/07/2020 1155   PHURINE 5.0 04/07/2020 1155   GLUCOSEU >=500 (A) 04/07/2020 1155   HGBUR SMALL (A) 04/07/2020 1155   BILIRUBINUR NEGATIVE 04/07/2020 1155   KETONESUR 20 (A) 04/07/2020 1155   PROTEINUR 100 (A) 04/07/2020 1155   UROBILINOGEN 2.0 (H) 04/07/2020 0954   NITRITE NEGATIVE 04/07/2020 1155   LEUKOCYTESUR MODERATE (A) 04/07/2020 1155   Culture >=100,000 COLONIES/mL ESCHERICHIA COLIAbnormal    Blood  Culture    Component Value Date/Time   SDES BLOOD LEFT ANTECUBITAL 04/07/2020 1636   SPECREQUEST  04/07/2020 1636    BOTTLES DRAWN AEROBIC AND ANAEROBIC Blood Culture adequate volume   CULT ESCHERICHIA COLI (A) 04/07/2020 1636   REPTSTATUS 04/10/2020 FINAL 04/07/2020 1636   Lactic Acid, Venous    Component Value Date/Time   LATICACIDVEN 1.7 04/07/2020 1626   HbA1c - 11%  TSH - 0.790  Lipase - 52  PT/INR - 13.9/1.1   Discharge Instructions: Discharge Instructions    AMB Referral to [place] [facility]   Complete by: As directed    Please refer to telephonic RN for post hospital follow up. Inpatient referral received for new diagnosis - diabetes.  Netta Cedars, MSN, RN [facility] Liaison Nurse Mobile Phone [phone]  Toll free office [phone]   Reason for  consult: Post hospital follow up   Diagnoses of: Diabetes   Expected date of contact: 1-3 days (reserved for hospital discharges)   Ambulatory referral to Nutrition and Diabetic Education   Complete by: As directed    Call MD for:  difficulty breathing, headache or visual disturbances   Complete by: As directed    Call MD for:  extreme fatigue   Complete by: As directed    Call MD for:  hives   Complete by: As directed    Call MD for:  persistant dizziness or light-headedness   Complete by: As directed    Call MD for:  persistant nausea and vomiting   Complete by: As directed    Call MD for:  redness, tenderness, or signs of infection (pain, swelling, redness, odor or green/yellow discharge around incision site)   Complete by: As directed    Call MD for:  severe uncontrolled pain   Complete by: As directed    Call MD for:  temperature >100.4   Complete by: As directed    Diet - low sodium heart healthy   Complete by: As directed    Discharge instructions   Complete by: As directed    Ms. Tamara Barnes, it was a pleasure taking care of you during your time here. You came in with fever and right  flank pain and were found to have a UTI (acute pyelonephritis) along with a blood infection. You were treated with 4 days of IV antibiotics while you were here. You were also found to have diabetes while you were here.  We are sending you home with 3 days of oral antibiotics. Please make sure you take the Cefuroxime two times daily as directed for the next 3 days.  We are also sending you home with a 3-day supply of Vicodin for pain. Please take as directed and only for severe pain.  Lastly, we are sending you home with Metformin (take orally twice daily) and Levemir (insulin injection - take at night before going to bed).   Please follow up with your primary care doctor in 1-2 weeks for further management and care.   Increase activity slowly   Complete by: As directed       Signed: Virl Axe, MD 04/10/2020, 6:01 PM   Pager: [phone]

## 2020-04-09 NOTE — Progress Notes (Addendum)
Subjective: Patient reports that she is having a lot of vomiting, reports vomiting all night. Reports that she has not passed a bowel movement since she has gotten here because she has not been able to eat anything.  States that it occurred with the medications she received last night. She is also reporting abdominal pain in the middle of her stomach, and radiates to her back.    ADDENDUM: At 1345, called RN Tammy, patient has not vomited since earlier this morning and was able to tolerate apple juice. Will give PO potassium chloride tablets to replete potassium and also to monitor if PO intake is tolerable.  Objective:  Vital signs in last 24 hours: Vitals:   04/08/20 1625 04/08/20 2102 04/09/20 0521 04/09/20 0925  BP: (!) 138/87 (!) 142/87 (!) 159/93 (!) 145/95  Pulse: 84 86 89 79  Resp: 18 18 19 18   Temp: 98.5 F (36.9 C) 99.8 F (37.7 C) 99.7 F (37.6 C) 99.3 F (37.4 C)  TempSrc: Oral Oral Oral Oral  SpO2: 98% 95% 98% 98%  Weight:      Height:       Physical Exam: General: middle-aged female, lying in bed, uncomfortable. CV: Normal rate and regular rhythm. No m/r/g Pulm: CTABL, no adventitious sounds noted. Abdominal: Soft, non-distended. Normoactive bowel sounds. Tender in the epigastric region. R CVA tenderness. Extremities: Warm and well perfused. No edema.  Assessment/Plan:  Principal Problem:   E coli bacteremia Active Problems:   Type 2 diabetes mellitus without complication, without long-term current use of insulin (HCC)   Pyelonephritis  E. coli Bacteremia 2/2 Acute Pyelonephritis: On day 3 of IV Rocephin. Presenting with fever, chills, dysuria, R CVA tenderness, and leukocytosis (12.2). UA negative nitrites, moderate leukocytes, many bacteria, WBCs>50. CT renal stone study suggestive of R-sided acute pyelonephritis. Blood cultures positive for E coli (2/4), still awaiting 2nd set of cultures. Urine cultures showing >100,000 GNR. -WBCs 12.2 --> 11.7 -->  10.2 -Norco prn for pain -f/u remaining blood cultures -f/u sensitivities -day 3 of IV rocephin 2g q24h -can switch to oral abx to finish 7-day course once patient can tolerate PO intake  Epigastric abdominal pain, nausea and vomiting: Patient complaining of abrupt onset epigastric pain, N/V, and constipation. Currently afebrile. No leukocytosis on repeat CBC. Repeat lipase 52 (mildly elevated). -f/u KUB to check for ileus vs SBO -IV phenergan for nausea -ordered 551mL IV LR bolus due to decreased PO intake and vomiting overnight -given PO KCl tablets for potassium repletion and to determine if patient can tolerate PO intake  -continue monitoring  Hypokalemia: CMP on 07/28 showing K 3.3. Will replete with PO KCl 80mEq. Continue monitoring.  Hyponatremia: At admission, sodium 127. Could be pseudohyponatremia 2/2 hyperglycemia vs hypotonic hyponatremia 2/2 volume depletion. Received 1L LR in ED. -Na 127 --> 131 --> 133 -measured serum osm 288 -urine osm and urine sodium pending -TSH wnl -On maintenance IV fluids with NS 150cc/hr  Newly diagnosed T2DM: Glucose on admission 398. HbA1c 11%. UA showing significant glucosuria, ketonuria, and proteinuria. -blood glucose levels improving -continue CBG monitoring -Continue SSI while here -Metformin along with either oral DPP-4 inhibitor vs lantus 15 units at discharge, f/u with PCP -Avoid SGLT-2 inhibitor going forward due to current acute pyelonephritis  Prior to Admission Living Arrangement: Home Anticipated Discharge Location: TBD Barriers to Discharge: pending medical workup Dispo: pending medical workup  Virl Axe, MD 04/09/2020, 1:57 PM Pager: [phone] After 5pm on weekdays and 1pm on weekends: On Call pager [phone]

## 2020-04-10 LAB — CULTURE, BLOOD (ROUTINE X 2): Special Requests: ADEQUATE

## 2020-04-10 LAB — GLUCOSE, CAPILLARY
Glucose-Capillary: 234 mg/dL — ABNORMAL HIGH (ref 70–99)
Glucose-Capillary: 178 mg/dL — ABNORMAL HIGH (ref 70–99)
Glucose-Capillary: 178 mg/dL — ABNORMAL HIGH (ref 70–99)

## 2020-04-10 MED ORDER — NOVOLIN 70/30 FLEXPEN RELION (70-30) 100 UNIT/ML ~~LOC~~ SUPN: 5.0000 [IU] | PEN_INJECTOR | Freq: Two times a day (BID) | SUBCUTANEOUS | 0 refills | Status: DC

## 2020-04-10 MED ORDER — HYDROCODONE-ACETAMINOPHEN 5-325 MG PO TABS: 1.0000 | ORAL_TABLET | Freq: Four times a day (QID) | ORAL | 0 refills | Status: AC | PRN

## 2020-04-10 MED ORDER — NOVOLIN 70/30 (70-30) 100 UNIT/ML ~~LOC~~ SUSP
5.0000 [IU] | Freq: Two times a day (BID) | SUBCUTANEOUS | 0 refills | Status: DC
Start: 2020-04-10 — End: 2020-04-10

## 2020-04-10 MED ORDER — CEFUROXIME AXETIL 500 MG PO TABS: 500.0000 mg | ORAL_TABLET | Freq: Two times a day (BID) | ORAL | 0 refills | Status: AC

## 2020-04-10 MED ORDER — METFORMIN HCL 500 MG PO TABS: 500.0000 mg | ORAL_TABLET | Freq: Two times a day (BID) | ORAL | 0 refills | Status: DC

## 2020-04-10 MED ORDER — INSULIN DETEMIR 100 UNIT/ML FLEXPEN
10.0000 [IU] | PEN_INJECTOR | Freq: Every day | SUBCUTANEOUS | 0 refills | Status: DC
Start: 2020-04-10 — End: 2020-04-10

## 2020-04-10 MED FILL — PENTIPS 32G X 4 MM MISC: 32G X 4 MM | 100 days supply | Qty: 100 | Fill #0

## 2020-04-10 MED FILL — HYDROCODON-APAP 5-325: 5-325 | 3 days supply | Qty: 12 | Fill #0

## 2020-04-10 MED FILL — metFORMIN HCL 500 MG TABS: 500 | 30 days supply | Qty: 60 | Fill #0

## 2020-04-10 MED FILL — CEFUROXIME AXETIL 500 MG TA: 500 | 3 days supply | Qty: 6 | Fill #0

## 2020-04-10 NOTE — TOC Transition Note (Signed)
Transition of Care [facility]) - CM/SW Discharge Note   Patient Details  Name: Genola Yuille MRN: [id number] Date of Birth: 1966-03-14  Transition of Care [facility]) CM/SW Contact:  Bartholomew Crews, RN Phone Number: [phone] 04/10/2020, 4:56 PM   Clinical Narrative:     Spoke with [place] about diabetes medications. Patient has deductible of $4950 with out of pocket of $8550. [facility] unable to verify if Lantus is on formulary, but advised that Levemir was. Medications initially sent to [facility], however, patient cost was $117 which she could not afford. TOC pharmacist reached out to MD. Insulin prescriptions sent to [facility], and other medications filled by TOC. No further TOC needs identified.  Final next level of care: Home/Self Care Barriers to Discharge: No Barriers Identified   Patient Goals and CMS Choice        Discharge Placement                       Discharge Plan and Services                                     Social Determinants of Health (SDOH) Interventions     Readmission Risk Interventions No flowsheet data found.

## 2020-04-10 NOTE — Progress Notes (Signed)
DISCHARGE NOTE HOME [name] to be discharged to home per MD order. Discussed prescriptions and follow up appointments with the patient. Prescriptions given to patient; medication list explained in detail. Patient verbalized understanding.  Skin clean, dry and intact without evidence of skin break down, no evidence of skin tears noted. IV catheter discontinued intact. Site without signs and symptoms of complications. Dressing and pressure applied. Pt denies pain at the site currently. No complaints noted.  Patient free of lines, drains, and wounds.   An After Visit Summary (AVS) was printed and given to the patient. Patient escorted via wheelchair, and discharged home via private auto.  Dutton, Zenon Mayo, RN

## 2020-04-10 NOTE — Progress Notes (Addendum)
Inpatient Diabetes Program Recommendations  AACE/ADA: New Consensus Statement on Inpatient Glycemic Control (2015)  Target Ranges:  Prepandial:   less than 140 mg/dL      Peak postprandial:   less than 180 mg/dL (1-2 hours)      Critically ill patients:  140 - 180 mg/dL   Lab Results  Component Value Date   GLUCAP 234 (H) 04/10/2020   HGBA1C 11.0 (H) 04/07/2020    Review of Glycemic Control Results for Tamara Barnes, Tamara Barnes (MRN [id number]) as of 04/10/2020 10:17  Ref. Range 04/09/2020 11:11 04/09/2020 16:21 04/09/2020 20:45 04/10/2020 06:44  Glucose-Capillary Latest Ref Range: 70 - 99 mg/dL 191 (H) 157 (H) 141 (H) 234 (H)   Diabetes history:  New DM2 Outpatient Diabetes medications:  None Current orders for Inpatient glycemic control:  Novolog 0-15 units tid with meals, Lantus 10 units QHS  Inpatient Diabetes Program Recommendations:     If to remain inpatient, could consider slight increase to basal. Consider Lantus 12 units QHS.   Addendum: spoke with patient again to review injections. Patient has been self injecting during admission and feels comfortable. Offered to re-demonstrate insulin pen, patient feels comfortable and denied. Reviewed survival skills, interventions, and relion products.  Contacted RNCM to assist with insulin coverage, TOC pharmacy and follow up. Levemir appears to be better covered.  Thanks, Bronson Curb, MSN, RNC-OB Diabetes Coordinator [phone] (8a-5p)

## 2020-04-10 NOTE — Progress Notes (Signed)
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

## 2020-04-10 NOTE — Progress Notes (Signed)
   Subjective: Examined patient at bedside.  Reports feeling a lot better. Reports only throwing up once last night, felt great all night otherwise.  Had a BM this morning.  Discussed being sent home on oral antibiotics. Asked to have some pain medications sent home with her.   Patient says she is ok with insulin at home and team stated that we will send her with low dose metformin twice a day and levemir once at night.   Discussed follow up with PCP for further management and care. Reports that she does not remember her primary care doctor's name but she goes to [facility] [address].  Objective:  Vital signs in last 24 hours: Vitals:   04/09/20 1620 04/09/20 2043 04/10/20 0550 04/10/20 0916  BP: (!) 153/81 (!) 163/91 (!) 159/88 (!) 157/102  Pulse: 74 77 70 76  Resp: 18 18 18 18   Temp: 98.5 F (36.9 C) 99.8 F (37.7 C) 98.6 F (37 C) 98.8 F (37.1 C)  TempSrc:  Oral  Oral  SpO2: 97% 93% 98% 97%  Weight:  63.5 kg    Height:       Physical Exam: General: Middle-aged female, lying in bed, comfortable. CV: Normal rate and regular rhythm. No m/r/g Pulm: CTABL, no adventitious sounds noted. Abdominal: Soft, non-distended, non-tender. Normoactive bowel sounds. No CVA tenderness noted on exam today. Extremities: Warm and well perfused. No edema.  Assessment/Plan:  Principal Problem:   E coli bacteremia Active Problems:   Type 2 diabetes mellitus without complication, without long-term current use of insulin (HCC)   Pyelonephritis   Abdominal pain  E coli Bacteremia 2/2 Acute Pyelonephritis: On day 4 of IV Rocephin. Blood cultures positive for E. Coli (2/4), awaiting 2nd set of cultures. Sensitivities showing resistance to Ampicillin and Ampicillin/Sulbactam. Patient tolerating oral intake and is clinically well-appearing. Will discharge home with 3 days of PO Cefuroxime 500mg  BID to finish 7-day course of antibiotics. Will send home with 3 days of vicodin q6h prn  for severe pain.   Constipation: KUB yesterday showed large amount of stool in colon. Given miralax with subsequent bowel movement and improvement of symptoms. Patient states that she takes milk of magnesia at home. Informed her to use OTC miralax as needed.  T2DM: Improving blood glucose levels since admission. Will discharge with metformin 500mg  BID and Levemir flexpen 10 units QHS. Received education from diabetes coordinator in regards to using flexpen. Will f/u with PCP in 1-2 weeks.  Prior to Admission Living Arrangement: Home Anticipated Discharge Location: Home Barriers to Discharge: None Dispo: Anticipated discharge today.   Virl Axe, MD 04/10/2020, 1:37 PM Pager: [phone] After 5pm on weekdays and 1pm on weekends: On Call pager [phone]

## 2020-04-11 NOTE — Progress Notes (Signed)
Received call from patient who was having trouble picking up prescription. Spoke with pharmacist at [facility] who advised that prescription had been transferred to [facility]. Spoke with patient who stated that her granddaughter was able to pick up the prescription, and picked up a glucometer as well. Patient to schedule her hospital follow up appointment.   Manya Silvas, RN MSN CCM Transitions of Care [phone]

## 2020-04-12 LAB — CULTURE, BLOOD (ROUTINE X 2)
Culture: NO GROWTH
Special Requests: ADEQUATE

## 2020-04-14 ENCOUNTER — Other Ambulatory Visit: Payer: Self-pay

## 2020-04-14 NOTE — Patient Outreach (Signed)
[facility]) Care Management  04/14/2020  Orrie Lascano 1966-08-23 [id number]   New referral: Reviewed MEDICAL RECORD NUMBER Diagnosis: new onset of DM, UTI, Sepsis, Bacteremia.  Placed call to patient with no answer.   PLAN: left a message requesting a call back. Will mail outreach letter today.  [name], RN, BSN, CEN [facility] [employer] [phone]

## 2020-04-17 ENCOUNTER — Other Ambulatory Visit: Payer: Self-pay

## 2020-04-17 ENCOUNTER — Ambulatory Visit: Payer: PRIVATE HEALTH INSURANCE

## 2020-04-18 NOTE — Patient Outreach (Signed)
[facility]) Care Management  04/18/2020  Tamara Barnes 12/04/1965 [id number]   New referral: New diagnosis of Dm Phone call completed on 04/17/2020  2nd outreach attempt to patient was successful.  Patient reports she is doing well. Reports she does not understand why her CBG remains elevated. Reports CBG of 342. Reports when she went to bed last night her CBG was 300 then 187 fasting in the morning..  Reports she is watching her diet.   Patient reports she finisher her antibiotics and pain medication.  States she is not having any difficulty with her medications. Denies any diarrhea with Metformin.   Denies any urinary symptoms at this time.   Reviewed follow up appointments with patient who has no PCP. Reports she is [facility]2020.  Confirmed appointment in computer.   PLAN: Reviewed importance of follow up. Will attempt to get an earlier appointment due to continued high CBG levels.   Will plan follow in 1 week.  [name], RN, BSN, CEN [facility] [employer] [phone]

## 2020-04-21 ENCOUNTER — Other Ambulatory Visit: Payer: Self-pay

## 2020-04-21 NOTE — Patient Outreach (Signed)
[facility]) Care Management  04/21/2020  Tamara Barnes [birth year] [id number]   Care coordination:  Placed call to community health [facility] wellness.Reviewed pending appointment and requested an earlier date.  I spoke with Hassan Rowan who reports 05/01/2020 is the earliest available appointment.   Arranged for patient to be added to the waiting list.  PLAN: will inform patient.  [name], RN, BSN, CEN [facility] [employer] [phone]

## 2020-04-21 NOTE — Patient Outreach (Signed)
Tamara Barnes) Care Management  04/21/2020  Tamara Barnes 15-Jan-1966 [id number]    Transition of care:  Placed call to patient who reports she is keeping her CBG under 200. Reports she taking her medications as prescribed.  Reports she has all her medications.  Reviewed with patient the importance of exercise and she states she walks about 30 minutes per day ( 15 minutes on way to a family members home and 15 minutes back).  BG today of 187.   Reviewed with patient phone call with [facility]. Patient now on a waiting list to get an earlier appointment other than 05/01/2020.  PLAN:will plan follow up in 1 week.  Tamara Rand, RN, BSN, CEN [facility] [employer] [phone]

## 2020-04-28 ENCOUNTER — Other Ambulatory Visit: Payer: Self-pay

## 2020-04-29 NOTE — Patient Outreach (Signed)
[facility]) Care Management  04/29/2020  Tamara Barnes [birth year] [id number]   Telephone assessment:  Placed call to patient who answered and reports she is doing okay but states she is having some urinary problems. Reports that when she empties her bladder she still feels like she has to void. Denies dysuria.  Reports Uti when in the hospital.   Reports CBG 156 on 8/16021.  Reports CBG levels have been running under 200.   PLAN: reviewed pending MD appointment. Encouraged patient to take her CBG log with her to her appointment. Will plan telephone follow up in 1 week.  [name], RN, BSN, CEN [facility] [employer] [phone]

## 2020-04-30 NOTE — Progress Notes (Signed)
Tamara Barnes, is a 54 y.o. female  JEH:[id number]  VZC:[id number]  DOB - 1965-12-26  Subjective:  Chief Complaint and HPI: Tamara Barnes is a 54 y.o. female here today to establish care and for a follow up visit After hospitalization 7/26-7/29/2021.  She is continuing to feel tired and having some urinary frequency.  Blood sugars running <200 and improving.  She is drinking 3 bottles of water daily.  Some nausea.  No vomiting.  No fever.    From discharge summary: Disposition and follow-up:   Tamara Barnes was discharged from [facility] [place] condition.  At the hospital follow up visit please address:  1.  E coli bacteremia 2/2 acute pyelonephritis: Received 4 days of IV rocephin. Blood cultures showing E coli (2/4). Sensitivities showed resistance to ampicillin and ampicillin/sulbactam. Discharged home with 3 days of PO Cefuroxime 500mg  BID (last dose on August 1st, 2021). Discharged home with 3 days of PO Vicodin q6h prn for severe pain. F/u with PCP in 1-2 weeks.  Newly diagnosed T2DM: Glucose 398 on admission. HbA1c 11%. Discharged home with Metformin 500mg  BID and Novolin 70/30 FlexPen 5 units twice daily with meals. Will f/u with PCP in one week. Recommend avoiding SGLT-2 inhibitors moving forward given current admission for acute pyelonephritis.  2.  Labs / imaging needed at time of follow-up: CBC, BMP, UA in 6 weeks, repeat blood cultures  3.  Pending labs/ test needing follow-up: None  ED/Hospital notes reviewed and summarized above     ROS:   Constitutional:  No f/c, No night sweats, No unexplained weight loss. EENT:  No vision changes, No blurry vision, No hearing changes. No mouth, throat, or ear problems.  Respiratory: No cough, No SOB Cardiac: No CP, no palpitations GI:  No abd pain, No V/D.  Some nausea GU: see above Musculoskeletal: No joint pain Neuro: No headache, no dizziness, no motor weakness.  Skin: No rash Endocrine:   No polydipsia. No polyuria.  Psych: Denies SI/HI  No problems updated.  ALLERGIES: Allergies  Allergen Reactions  . Oatmeal Shortness Of Breath and Other (See Comments)    Tongue swells  . Rice Shortness Of Breath, Swelling and Other (See Comments)    Tongue swells  . Wheat Bran Shortness Of Breath, Swelling and Other (See Comments)    Tongue swells    PAST MEDICAL HISTORY: History reviewed. No pertinent past medical history.  MEDICATIONS AT HOME: Prior to Admission medications   Medication Sig Start Date End Date Taking? Authorizing Provider  insulin isophane & regular human (NOVOLIN 70/30 FLEXPEN RELION) (70-30) 100 UNIT/ML KwikPen Inject 5 Units into the skin 2 (two) times daily with a meal. 05/01/20  Yes Ival Pacer M, PA-C  metFORMIN (GLUCOPHAGE) 500 MG tablet Take 1 tablet (500 mg total) by mouth 2 (two) times daily with a meal. 05/01/20 05/31/20 Yes Hudsen Fei M, PA-C  fluconazole (DIFLUCAN) 150 MG tablet Take 1 tablet (150 mg total) by mouth once for 1 dose. Now and 1 dose in 2 weeks 05/01/20 05/01/20  Argentina Donovan, PA-C  Insulin Pen Needle 32G X 4 MM MISC 1 each by Does not apply route in the morning and at bedtime. 05/01/20   Argentina Donovan, PA-C  sulfamethoxazole-trimethoprim (BACTRIM DS) 800-160 MG tablet Take 1 tablet by mouth 2 (two) times daily. 05/01/20   Argentina Donovan, PA-C     Objective:  EXAM:   Vitals:   05/01/20 1502  BP: 112/71  Pulse: 90  Temp: 97.7 F (36.5 C)  TempSrc: Temporal  SpO2: 98%  Weight: 137 lb (62.1 kg)    General appearance : A&OX3. NAD. Non-toxic-appearing HEENT: Atraumatic and Normocephalic.  PERRLA. EOM intact.  Neck: supple, no JVD. No cervical lymphadenopathy. No thyromegaly Chest/Lungs:  Breathing-non-labored, Good air entry bilaterally, breath sounds normal without rales, rhonchi, or wheezing  CVS: S1 S2 regular, no murmurs, gallops, rubs  Abdomen: Bowel sounds present, Non tender and not distended with no  gaurding, rigidity or rebound. Extremities: Bilateral Lower Ext shows no edema, both legs are warm to touch with = pulse throughout Neurology:  CN II-XII grossly intact, Non focal.   Psych:  TP linear. J/I fair. Normal speech. Appropriate eye contact and affect.  Skin:  No Rash  Data Review Lab Results  Component Value Date   HGBA1C 11.0 (H) 04/07/2020     Assessment & Plan   1. Type 2 diabetes mellitus without complication, without long-term current use of insulin (HCC) improving - Glucose (CBG) - Comprehensive metabolic panel - insulin isophane & regular human (NOVOLIN 70/30 FLEXPEN RELION) (70-30) 100 UNIT/ML KwikPen; Inject 5 Units into the skin 2 (two) times daily with a meal.  Dispense: 15 mL; Refill: 3 - metFORMIN (GLUCOPHAGE) 500 MG tablet; Take 1 tablet (500 mg total) by mouth 2 (two) times daily with a meal.  Dispense: 60 tablet; Refill: 2 - Insulin Pen Needle 32G X 4 MM MISC; 1 each by Does not apply route in the morning and at bedtime.  Dispense: 100 each; Refill: 3  2. Urinary frequency - CBC with Differential/Platelet - POCT URINALYSIS DIP (CLINITEK)  3. Other fatigue  - Vitamin D, 25-hydroxy  4. Hospital discharge follow-up   5. Hypokalemia - Comprehensive metabolic panel  6. Pyelonephritis Not resolved-will treat aggressively here and hopefully avoid readmission-drink 80-100 ounces water daily.   -Rx zofran - CBC with Differential/Platelet - sulfamethoxazole-trimethoprim (BACTRIM DS) 800-160 MG tablet; Take 1 tablet by mouth 2 (two) times daily.  Dispense: 28 tablet; Refill: 0 - fluconazole (DIFLUCAN) 150 MG tablet; Take 1 tablet (150 mg total) by mouth once for 1 dose. Now and 1 dose in 2 weeks  Dispense: 1 tablet; Refill: 0 - cefTRIAXone (ROCEPHIN) injection 500 mg  7. Hyponatremia  8. TOBACCO DEPENDENCE Smoking and dangers of nicotine have been discussed at length. Long term health consequences of smoking reviewed in detail.  Methods for helping with  cessation have been reviewed.  Patient expresses understanding.      Patient have been counseled extensively about nutrition and exercise  Return in about 2 weeks (around 05/15/2020) for recheck urine and blood sugars-with me or assign a PCP.  The patient was given clear instructions to go to ER or return to medical center if symptoms don't improve, worsen or new problems develop. The patient verbalized understanding. The patient was told to call to get lab results if they haven't heard anything in the next week.     Freeman Caldron, PA-C [facility] [place], [place]   05/01/2020, 3:46 PMPatient ID: Tamara Barnes, female   DOB: 11-Oct-1965, 54 y.o.   MRN: [id number]

## 2020-05-01 ENCOUNTER — Encounter: Payer: Self-pay | Admitting: Physician Assistant

## 2020-05-01 ENCOUNTER — Ambulatory Visit: Payer: 59 | Attending: Physician Assistant | Admitting: Physician Assistant

## 2020-05-01 ENCOUNTER — Other Ambulatory Visit: Payer: Self-pay

## 2020-05-01 VITALS — BP 112/71 | HR 90 | Temp 97.7°F | Wt 137.0 lb

## 2020-05-01 DIAGNOSIS — E119 Type 2 diabetes mellitus without complications: Secondary | ICD-10-CM | POA: Diagnosis not present

## 2020-05-01 DIAGNOSIS — R5383 Other fatigue: Secondary | ICD-10-CM

## 2020-05-01 DIAGNOSIS — N12 Tubulo-interstitial nephritis, not specified as acute or chronic: Secondary | ICD-10-CM

## 2020-05-01 DIAGNOSIS — E876 Hypokalemia: Secondary | ICD-10-CM

## 2020-05-01 DIAGNOSIS — F17200 Nicotine dependence, unspecified, uncomplicated: Secondary | ICD-10-CM

## 2020-05-01 DIAGNOSIS — Z09 Encounter for follow-up examination after completed treatment for conditions other than malignant neoplasm: Secondary | ICD-10-CM | POA: Diagnosis not present

## 2020-05-01 DIAGNOSIS — R350 Frequency of micturition: Secondary | ICD-10-CM | POA: Diagnosis not present

## 2020-05-01 DIAGNOSIS — E871 Hypo-osmolality and hyponatremia: Secondary | ICD-10-CM

## 2020-05-01 LAB — POCT URINALYSIS DIP (CLINITEK)
Bilirubin, UA: NEGATIVE
Glucose, UA: NEGATIVE mg/dL
Ketones, POC UA: NEGATIVE mg/dL
Nitrite, UA: POSITIVE — AB
POC PROTEIN,UA: 30 — AB
Spec Grav, UA: 1.015 (ref 1.010–1.025)
Urobilinogen, UA: 1 E.U./dL
pH, UA: 5.5 (ref 5.0–8.0)

## 2020-05-01 LAB — GLUCOSE, POCT (MANUAL RESULT ENTRY): POC Glucose: 145 mg/dl — AB (ref 70–99)

## 2020-05-01 MED ORDER — CEFTRIAXONE SODIUM 500 MG IJ SOLR
500.0000 mg | Freq: Once | INTRAMUSCULAR | Status: AC
  Administered 2020-05-01: 500 mg via INTRAMUSCULAR

## 2020-05-01 MED ORDER — SULFAMETHOXAZOLE-TRIMETHOPRIM 800-160 MG PO TABS: 1.0000 | ORAL_TABLET | Freq: Two times a day (BID) | ORAL | 0 refills | Status: DC

## 2020-05-01 MED ORDER — ONDANSETRON HCL 8 MG PO TABS
8.0000 mg | ORAL_TABLET | Freq: Three times a day (TID) | ORAL | 0 refills | Status: DC | PRN
Start: 2020-05-01 — End: 2020-07-03

## 2020-05-01 MED ORDER — METFORMIN HCL 500 MG PO TABS: 500.0000 mg | ORAL_TABLET | Freq: Two times a day (BID) | ORAL | 2 refills | Status: DC

## 2020-05-01 MED ORDER — NOVOLIN 70/30 FLEXPEN RELION (70-30) 100 UNIT/ML ~~LOC~~ SUPN: 5.0000 [IU] | PEN_INJECTOR | Freq: Two times a day (BID) | SUBCUTANEOUS | 3 refills | Status: DC

## 2020-05-01 MED ORDER — INSULIN PEN NEEDLE 32G X 4 MM MISC: 1.0000 | Freq: Two times a day (BID) | 3 refills | Status: DC

## 2020-05-01 MED ORDER — FLUCONAZOLE 150 MG PO TABS: 150.0000 mg | ORAL_TABLET | Freq: Once | ORAL | 0 refills | Status: AC

## 2020-05-01 MED FILL — SULFAMETHOXAZOLE-TMP DS TAB: 800-160 | 14 days supply | Qty: 28 | Fill #0

## 2020-05-01 NOTE — Patient Instructions (Addendum)
Drink 80-100 ounces water daily   Pyelonephritis, Adult  Pyelonephritis is an infection that occurs in the kidney. The kidneys are organs that help clean the blood by moving waste out of the blood and into the pee (urine). This infection can happen quickly, or it can last for a long time. In most cases, it clears up with treatment and does not cause other problems. What are the causes? This condition may be caused by:  Germs (bacteria) going from the bladder up to the kidney. This may happen after having a bladder infection.  Germs going from the blood to the kidney. What increases the risk? This condition is more likely to develop in:  Pregnant women.  Older people.  People who have any of these conditions: ? Diabetes. ? Inflammation of the prostate gland (prostatitis), in males. ? Kidney stones or bladder stones. ? Other problems with the kidney or the parts of your body that carry pee from the kidneys to the bladder (ureters). ? Cancer.  People who have a small, thin tube (catheter) placed in the bladder.  People who are sexually active.  Women who use a medicine that kills sperm (spermicide) to prevent pregnancy.  People who have had a prior urinary tract infection (UTI). What are the signs or symptoms? Symptoms of this condition include:  Peeing often.  A strong urge to pee right away.  Burning or stinging when peeing.  Belly pain.  Back pain.  Pain in the side (flank area).  Fever or chills.  Blood in the pee, or dark pee.  Feeling sick to your stomach (nauseous) or throwing up (vomiting). How is this treated? This condition may be treated by:  Taking antibiotic medicines by mouth (orally).  Drinking enough fluids. If the infection is bad, you may need to stay in the hospital. You may be given antibiotics and fluids that are put directly into a vein through an IV tube. In some cases, other treatments may be needed. Follow these instructions at  home: Medicines  Take your antibiotic medicine as told by your doctor. Do not stop taking the antibiotic even if you start to feel better.  Take over-the-counter and prescription medicines only as told by your doctor. General instructions   Drink enough fluid to keep your pee pale yellow.  Avoid caffeine, tea, and carbonated drinks.  Pee (urinate) often. Avoid holding in pee for long periods of time.  Pee before and after sex.  After pooping (having a bowel movement), women should wipe from front to back. Use each tissue only once.  Keep all follow-up visits as told by your doctor. This is important. Contact a doctor if:  You do not feel better after 2 days.  Your symptoms get worse.  You have a fever. Get help right away if:  You cannot take your medicine or drink fluids as told.  You have chills and shaking.  You throw up.  You have very bad pain in your side or back.  You feel very weak or you pass out (faint). Summary  Pyelonephritis is an infection that occurs in the kidney.  In most cases, this infection clears up with treatment and does not cause other problems.  Take your antibiotic medicine as told by your doctor. Do not stop taking the antibiotic even if you start to feel better.  Drink enough fluid to keep your pee pale yellow. This information is not intended to replace advice given to you by your health care provider. Make sure  you discuss any questions you have with your health care provider. Document Revised: 07/04/2018 Document Reviewed: 07/04/2018 Elsevier Patient Education  [place].    Smoking Tobacco Information, Adult Smoking tobacco can be harmful to your health. Tobacco contains a poisonous (toxic), colorless chemical called nicotine. Nicotine is addictive. It changes the brain and can make it hard to stop smoking. Tobacco also has other toxic chemicals that can hurt your body and raise your risk of many cancers. How can smoking  tobacco affect me? Smoking tobacco puts you at risk for:  Cancer. Smoking is most commonly associated with lung cancer, but can also lead to cancer in other parts of the body.  Chronic obstructive pulmonary disease (COPD). This is a long-term lung condition that makes it hard to breathe. It also gets worse over time.  High blood pressure (hypertension), heart disease, stroke, or heart attack.  Lung infections, such as pneumonia.  Cataracts. This is when the lenses in the eyes become clouded.  Digestive problems. This may include peptic ulcers, heartburn, and gastroesophageal reflux disease (GERD).  Oral health problems, such as gum disease and tooth loss.  Loss of taste and smell. Smoking can affect your appearance by causing:  Wrinkles.  Yellow or stained teeth, fingers, and fingernails. Smoking tobacco can also affect your social life, because:  It may be challenging to find places to smoke when away from home. Many workplaces, [employer], hotels, and public places are tobacco-free.  Smoking is expensive. This is due to the cost of tobacco and the long-term costs of treating health problems from smoking.  Secondhand smoke may affect those around you. Secondhand smoke can cause lung cancer, breathing problems, and heart disease. Children of smokers have a higher risk for: ? Sudden infant death syndrome (SIDS). ? Ear infections. ? Lung infections. If you currently smoke tobacco, quitting now can help you:  Lead a longer and healthier life.  Look, smell, breathe, and feel better over time.  Save money.  Protect others from the harms of secondhand smoke. What actions can I take to prevent health problems? Quit smoking   Do not start smoking. Quit if you already do.  Make a plan to quit smoking and commit to it. Look for programs to help you and ask your health care provider for recommendations and ideas.  Set a date and write down all the reasons you want to  quit.  Let your friends and family know you are quitting so they can help and support you. Consider finding friends who also want to quit. It can be easier to quit with someone else, so that you can support each other.  Talk with your health care provider about using nicotine replacement medicines to help you quit, such as gum, lozenges, patches, sprays, or pills.  Do not replace cigarette smoking with electronic cigarettes, which are commonly called e-cigarettes. The safety of e-cigarettes is not known, and some may contain harmful chemicals.  If you try to quit but return to smoking, stay positive. It is common to slip up when you first quit, so take it one day at a time.  Be prepared for cravings. When you feel the urge to smoke, chew gum or suck on hard candy. Lifestyle  Stay busy and take care of your body.  Drink enough fluid to keep your urine pale yellow.  Get plenty of exercise and eat a healthy diet. This can help prevent weight gain after quitting.  Monitor your eating habits. Quitting smoking can  cause you to have a larger appetite than when you smoke.  Find ways to relax. Go out with friends or family to a movie or a restaurant where people do not smoke.  Ask your health care provider about having regular tests (screenings) to check for cancer. This may include blood tests, imaging tests, and other tests.  Find ways to manage your stress, such as meditation, yoga, or exercise. Where to find support To get support to quit smoking, consider:  Asking your health care provider for more information and resources.  Taking classes to learn more about quitting smoking.  Looking for local organizations that offer resources about quitting smoking.  Joining a support group for people who want to quit smoking in your local community.  Calling the smokefree.gov counselor helpline: 1-800-Quit-Now 612-654-5362) Where to find more information You may find more information about  quitting smoking from:  HelpGuide.org: www.helpguide.org  https://hall.com/: smokefree.gov  American Lung Association: www.lung.org Contact a health care provider if you:  Have problems breathing.  Notice that your lips, nose, or fingers turn blue.  Have chest pain.  Are coughing up blood.  Feel faint or you pass out.  Have other health changes that cause you to worry. Summary  Smoking tobacco can negatively affect your health, the health of those around you, your finances, and your social life.  Do not start smoking. Quit if you already do. If you need help quitting, ask your health care provider.  Think about joining a support group for people who want to quit smoking in your local community. There are many effective programs that will help you to quit this behavior. This information is not intended to replace advice given to you by your health care provider. Make sure you discuss any questions you have with your health care provider. Document Revised: 05/25/2019 Document Reviewed: 09/14/2016 Elsevier Patient Education  2020 [employer].

## 2020-05-02 LAB — CBC WITH DIFFERENTIAL/PLATELET
Basophils Absolute: 0 10*3/uL (ref 0.0–0.2)
Basos: 0 %
EOS (ABSOLUTE): 0.1 10*3/uL (ref 0.0–0.4)
Eos: 2 %
Hematocrit: 42.3 % (ref 34.0–46.6)
Hemoglobin: 14.2 g/dL (ref 11.1–15.9)
Immature Grans (Abs): 0 10*3/uL (ref 0.0–0.1)
Immature Granulocytes: 0 %
Lymphocytes Absolute: 1.3 10*3/uL (ref 0.7–3.1)
Lymphs: 21 %
MCH: 29.9 pg (ref 26.6–33.0)
MCHC: 33.6 g/dL (ref 31.5–35.7)
MCV: 89 fL (ref 79–97)
Monocytes Absolute: 0.4 10*3/uL (ref 0.1–0.9)
Monocytes: 6 %
Neutrophils Absolute: 4.4 10*3/uL (ref 1.4–7.0)
Neutrophils: 71 %
Platelets: 228 10*3/uL (ref 150–450)
RBC: 4.75 x10E6/uL (ref 3.77–5.28)
RDW: 12.3 % (ref 11.7–15.4)
WBC: 6.3 10*3/uL (ref 3.4–10.8)

## 2020-05-02 LAB — COMPREHENSIVE METABOLIC PANEL
ALT: 9 IU/L (ref 0–32)
AST: 18 IU/L (ref 0–40)
Albumin/Globulin Ratio: 1.7 (ref 1.2–2.2)
Albumin: 4.2 g/dL (ref 3.8–4.9)
Alkaline Phosphatase: 82 IU/L (ref 48–121)
BUN/Creatinine Ratio: 22 (ref 9–23)
BUN: 12 mg/dL (ref 6–24)
Bilirubin Total: 0.4 mg/dL (ref 0.0–1.2)
CO2: 27 mmol/L (ref 20–29)
Calcium: 9.8 mg/dL (ref 8.7–10.2)
Chloride: 103 mmol/L (ref 96–106)
Creatinine, Ser: 0.54 mg/dL — ABNORMAL LOW (ref 0.57–1.00)
GFR calc Af Amer: 123 mL/min/{1.73_m2} (ref >59)
GFR calc non Af Amer: 107 mL/min/{1.73_m2} (ref >59)
Globulin, Total: 2.5 g/dL (ref 1.5–4.5)
Glucose: 147 mg/dL — ABNORMAL HIGH (ref 65–99)
Potassium: 4.3 mmol/L (ref 3.5–5.2)
Sodium: 141 mmol/L (ref 134–144)
Total Protein: 6.7 g/dL (ref 6.0–8.5)

## 2020-05-02 LAB — VITAMIN D 25 HYDROXY (VIT D DEFICIENCY, FRACTURES): Vit D, 25-Hydroxy: 19.4 ng/mL — ABNORMAL LOW (ref 30.0–100.0)

## 2020-05-05 ENCOUNTER — Other Ambulatory Visit: Payer: Self-pay | Admitting: Physician Assistant

## 2020-05-05 ENCOUNTER — Other Ambulatory Visit: Payer: Self-pay

## 2020-05-05 MED ORDER — VITAMIN D (ERGOCALCIFEROL) 1.25 MG (50000 UNIT) PO CAPS
50000.0000 [IU] | ORAL_CAPSULE | ORAL | 0 refills | Status: DC
Start: 2020-05-05 — End: 2021-05-14

## 2020-05-05 MED FILL — VIT D2 1.25 MG (50,000 UNIT: 1.25 MG | 28 days supply | Qty: 4 | Fill #0

## 2020-05-05 NOTE — Patient Outreach (Addendum)
[facility] [facility]) Care Management  05/05/2020  Saroya Riccobono 10/29/1965 [id number]   Telephone assessment:  Placed call to patient who answered and reports she is feeling weak.  Reports DX of UTI and on antibiotics.  Reports she is drinking well.  CBG range of 113-166 in the last 2 days. Saw Primary MD at [place] last week as planned.   PLAN: encouraged good nutrition and hydration. Reviewed with patient the need for a urine recheck after finishing antibiotics.  Will plan follow up call in 1 week.   [name], RN, BSN, CEN [facility] [employer] [phone]

## 2020-05-12 ENCOUNTER — Ambulatory Visit: Payer: Self-pay

## 2020-05-13 ENCOUNTER — Ambulatory Visit: Payer: 59

## 2020-05-13 ENCOUNTER — Ambulatory Visit: Payer: Self-pay

## 2020-05-14 ENCOUNTER — Other Ambulatory Visit: Payer: Self-pay

## 2020-05-14 NOTE — Patient Outreach (Signed)
[place] [facility]) Care Management  05/14/2020  Tamara Barnes April 07, 1966 [id number]   Telephone assessments:  Placed call to patient who reports she is feeling better. Reports CBG under 150.  Denies any urinary symptoms right now. Reports she will be attending DM education classes this week. Denies any new problems or concerns.   Difficulty hearing patient on the phone as she is taking care of her nieces children. Agreed to complete assessments during next phone call.  PLAN: follow up call in 1 week.  Tamara Rand, RN, BSN, CEN [facility] [employer] [phone]

## 2020-05-15 ENCOUNTER — Encounter: Payer: 59 | Attending: Internal Medicine | Admitting: Dietician

## 2020-05-15 ENCOUNTER — Other Ambulatory Visit: Payer: Self-pay

## 2020-05-15 ENCOUNTER — Encounter: Payer: Self-pay | Admitting: Dietician

## 2020-05-15 DIAGNOSIS — E119 Type 2 diabetes mellitus without complications: Secondary | ICD-10-CM | POA: Insufficient documentation

## 2020-05-15 NOTE — Progress Notes (Signed)
Patient was seen on 05/15/2020 for the first of a series of three diabetes self[facility]  Patient Education Plan per assessed needs and concerns is to attend three course education program for Diabetes Self Management Education.  The following learning objectives were met by the patient during this class:  Describe diabetes, types of diabetes and pathophysiology  State some common risk factors for diabetes  Defines the role of glucose and insulin  Describe the relationship between diabetes and cardiovascular and other risks  State the members of the Healthcare Team  States the rationale for glucose monitoring and when to test  State their individual Target Range  State the importance of logging glucose readings and how to interpret the readings  Identifies A1C target  Explain the correlation between A1c and eAG values  State symptoms and treatment of high blood glucose and low blood glucose  Explain proper technique for glucose testing and identify proper sharps disposal  Handouts given during class include:  How to Thrive:  A Guide for Your Journey with Diabetes by the ADA  Meal Plan Card and carbohydrate content list  Dietary intake form  Low Sodium Flavoring Tips  Types of Fats  Dining Out  Label reading  Snack list  Planning a balanced meal  The diabetes portion plate  Diabetes Resources  A1c to eAG Conversion Chart  Blood Glucose Log  Diabetes Recommended Care Schedule  Support Group  Diabetes Success Plan  Core Class Satisfaction Survey   Follow-Up Plan:  Attend core 2   

## 2020-05-20 ENCOUNTER — Ambulatory Visit: Payer: Self-pay

## 2020-05-20 MED FILL — NOVOLIN 70/30 FLEXPEN (70-3: (70-30) 100 | 30 days supply | Qty: 3 | Fill #0

## 2020-05-20 MED FILL — METFORMIN HCL 500 MG TABS: 500 | 30 days supply | Qty: 60 | Fill #0

## 2020-05-20 MED FILL — FLUCONAZOLE 150 MG TABLET: 150 | 14 days supply | Qty: 1 | Fill #0

## 2020-05-21 ENCOUNTER — Ambulatory Visit: Payer: Self-pay

## 2020-05-22 ENCOUNTER — Other Ambulatory Visit: Payer: Self-pay

## 2020-05-22 ENCOUNTER — Ambulatory Visit: Payer: PRIVATE HEALTH INSURANCE

## 2020-05-22 NOTE — Patient Outreach (Signed)
[facility]) Care Management  05/22/2020  Tamara Barnes 1966-08-09 [id number]   Telephone assessment:  Placed call to patient for weekly follow up. She answered and reports she is going to DM class. Plan to call back tomorrow for follow up when patient can talk.  [name], RN, BSN, CEN [facility] [employer] [phone]

## 2020-05-23 ENCOUNTER — Other Ambulatory Visit: Payer: Self-pay

## 2020-05-23 NOTE — Patient Outreach (Signed)
[facility]) Care Management  [place]  05/23/2020   Tamara Barnes 07/24/1966 [id number] Telephone assessment: Subjective: Patient reports she is doing much better with her diabetes .  Recent UTI.  Finished antibiotics. Recently started DM education.  Has had first class. Was scheduled for 2nd class last night but patient had diarrhea. Patient report she is eating well and following her diet. Reports daily exercise. Reports CBG now 137-150.  Monitors CBG 3 times per day.   Objective:  Today's Vitals   05/23/20 1503  Weight: 133 lb 3.2 oz (60.4 kg)  Height: 1.6 m (5' 3")  PainSc: 0-No pain     Encounter Medications:  Outpatient Encounter Medications as of 05/23/2020  Medication Sig  . insulin isophane & regular human (NOVOLIN 70/30 FLEXPEN RELION) (70-30) 100 UNIT/ML KwikPen Inject 5 Units into the skin 2 (two) times daily with a meal.  . Insulin Pen Needle 32G X 4 MM MISC 1 each by Does not apply route in the morning and at bedtime.  . metFORMIN (GLUCOPHAGE) 500 MG tablet Take 1 tablet (500 mg total) by mouth 2 (two) times daily with a meal.  . Vitamin D, Ergocalciferol, (DRISDOL) 1.25 MG (50000 UNIT) CAPS capsule Take 1 capsule (50,000 Units total) by mouth every 7 (seven) days.  . ondansetron (ZOFRAN) 8 MG tablet Take 1 tablet (8 mg total) by mouth every 8 (eight) hours as needed for nausea or vomiting. (Patient not taking: Reported on 05/23/2020)  . sulfamethoxazole-trimethoprim (BACTRIM DS) 800-160 MG tablet Take 1 tablet by mouth 2 (two) times daily. (Patient not taking: Reported on 05/23/2020)   No facility-administered encounter medications on file as of 05/23/2020.    Functional Status:  In your present state of health, do you have any difficulty performing the following activities: 05/23/2020 04/08/2020  Hearing? N N  Vision? Y N  Comment wears glasses -  Difficulty concentrating or making decisions? N N  Walking or climbing stairs? N N  Dressing  or bathing? N N  Doing errands, shopping? N N  Preparing Food and eating ? N -  Using the Toilet? N -  In the past six months, have you accidently leaked urine? N -  Do you have problems with loss of bowel control? N -  Managing your Medications? N -  Managing your Finances? N -  Housekeeping or managing your Housekeeping? N -  Some recent data might be hidden    Fall/Depression Screening: Fall Risk  05/23/2020 05/15/2020  Falls in the past year? 0 0  Number falls in past yr: 0 -   PHQ 2/9 Scores 05/23/2020 05/15/2020 05/01/2020  PHQ - 2 Score 0 0 0  PHQ- 9 Score - - 3    Assessment: (1) new DM diagnosis 1 month ago. Reports decreased CBG and tolerating medications well.  (2)needs eye and dental referrals. (3) no signs and symptoms of UTI/ Completed antibiotics.  (4) no falls. (5) montiroting CBG 3 times per day. Encouraged feet inspection daily.    Plan:  (1) reviewed pending [facility] appointment on October 21 (2) encouraged patient to discuss referrals with MD. (3)  Reviewed s/s of UTI. Reviewed with patient when to call MD. (4) reviewed importance safety and fall preventions.  (5) reviewed with patient when to call MD for elevated or low CBG levels.  Reviewed importance of daily foot inspections.  Will follow up in 1 month. This note to be routed to MD at [place].  THN CM Care Plan Problem One     Most Recent Value  Care Plan Problem One Patient with a new diagnosis of DM.  Role Documenting the Problem One Care Management Coordinator  Care Plan for Problem One Active  THN Long Term Goal  Patient wll report decreased A1c to less than 9.0 in the next 90 days.   THN Long Term Goal Start Date 04/17/20  Interventions for Problem One Long Term Goal Reviewed current CBG levels. Encourage patient to continue to take medications as prescribed, attend DM classes and call MD for changes in condition.   THN CM Short Term Goal #1  Patient will  report monitoring her CBG daily for the nextt 30 days.   THN CM Short Term Goal #1 Start Date 04/17/20  [facility] CM Short Term Goal #1 Met Date 05/23/20  THN CM Short Term Goal #2  Patient will have hospital followup in the next 14 days.  THN CM Short Term Goal #2 Start Date 04/17/20  [facility] CM Short Term Goal #2 Met Date 05/05/20      [name], RN, BSN, CEN [place] Coordinator [phone]

## 2020-05-29 ENCOUNTER — Other Ambulatory Visit: Payer: Self-pay

## 2020-05-29 ENCOUNTER — Encounter: Payer: 59 | Admitting: Dietician

## 2020-05-29 ENCOUNTER — Encounter: Payer: Self-pay | Admitting: Dietician

## 2020-05-29 DIAGNOSIS — E119 Type 2 diabetes mellitus without complications: Secondary | ICD-10-CM | POA: Diagnosis not present

## 2020-05-29 NOTE — Progress Notes (Signed)
Patient was seen on 05/29/2020 for the third of a series of three diabetes self-management courses [facility].   Catalina Gravel the amount of activity recommended for healthy living . Describe activities suitable for individual needs . Identify ways to regularly incorporate activity into daily life . Identify barriers to activity and ways to over come these barriers  Identify diabetes medications being personally used and their primary action for lowering glucose and possible side effects . Describe role of stress on blood glucose and develop strategies to address psychosocial issues . Identify diabetes complications and ways to prevent them  Explain how to manage diabetes during illness . Evaluate success in meeting personal goal . Establish 2-3 goals that they will plan to diligently work on  Goals:   I will count my carb choices at most meals and snacks  I will be active more times a week  I will take my diabetes medications as scheduled  I will eat less unhealthy fats  I will test my glucose   I will look at patterns in my record book at least 3 days a month  To help manage stress I will  rest at least 5 times a week  Your patient has identified these potential barriers to change:  Lack of Family Support  Your patient has identified their diabetes self-care support plan as  Family Support    Plan:  Attend Support Group as desired

## 2020-06-03 MED FILL — BD PEN NDL NANO 32GX5/32: 32G X 4 MM | 50 days supply | Qty: 100 | Fill #0

## 2020-06-19 ENCOUNTER — Other Ambulatory Visit: Payer: Self-pay

## 2020-06-19 NOTE — Patient Outreach (Signed)
[facility]) Care Management  06/19/2020  Jamarie Joplin May 20, 1966 [id number]   Telephone assessment:  Placed call to patient for follow up assessment.  No answer.  PLAN: Will plan to call back in 3 days.   [name], RN, BSN, CEN [facility] [employer] [phone]

## 2020-06-24 ENCOUNTER — Other Ambulatory Visit: Payer: Self-pay

## 2020-06-24 NOTE — Patient Outreach (Signed)
[facility]) Care Management  06/24/2020  Adriene Knipfer [birth year] [id number]   Telephone assessment and DM follow up:  Placed call to patient who answered and reports she is doing well.  Reports she has been sick for about 1 week with fever and cough. No appetite. Reports she had a Covid test yesterday and will get results tomorrow.  Reports CBG running 115.  Reports having difficulty getting is under 100. Reports she has not been as strict with her diet.  Reports she has a sore mouth.  Reports she has been fully vaccinated and will get her covid booster on 07/19/2020.    Denies any other concerns today. Reviewed pending DM class on 10/19 and MD appointment on 10/21.   PLAN: reviewed importance of self management of Dm with diet, meds and exercise. Will plan follow up call after MD visit. Encouraged patient to call for worsening of condition. Also reviewed with patient if positive covid test she will need to notify MD office.   [name], RN, BSN, CEN [facility] [employer] [phone]

## 2020-07-01 ENCOUNTER — Encounter: Payer: Self-pay | Admitting: Registered"

## 2020-07-01 ENCOUNTER — Other Ambulatory Visit: Payer: Self-pay

## 2020-07-01 ENCOUNTER — Encounter: Payer: 59 | Attending: Internal Medicine | Admitting: Registered"

## 2020-07-01 DIAGNOSIS — E119 Type 2 diabetes mellitus without complications: Secondary | ICD-10-CM | POA: Insufficient documentation

## 2020-07-01 NOTE — Progress Notes (Signed)
Diabetes Self-Management Education  Visit Type:  Follow-up  Appt. Start Time: 11:10 Appt. End Time: 11:55  07/01/2020  Tamara Barnes, identified by name and date of birth, is a 54 y.o. female with a diagnosis of Diabetes: Type 2.   ASSESSMENT  Pt arrives stating she hasn't been able to eat much lately due to teeth challenges. Reports teeth have been falling out since diabetes diagnosis 03/2020. Reports 2 lost teeth and a 3rd one currently loose, making it hard to eat. States she has been trying to get dentist appointment but unable to due to insurance; last dental visit was 09/2019. States she has been drinking a lot of Glucerna lately.   Checks BS 3x/day: FBS (160-194), 12 pm (139-150), and and 9 pm (115-120). States she had a slice of pizza and BS was 215.   States she eats whole wheat bread and enjoys it. Reports having adverse reaction to rice, therefore does not eat it nor grits and oatmeal.    Reports she exercises at home and has membership at local gym where she does strength training.   Last menstrual period 01/14/2015. There is no height or weight on file to calculate BMI.    Diabetes Self-Management Education - 07/01/20 1112      Psychosocial Assessment   Self-care barriers None    Patient Concerns Nutrition/Meal planning    Special Needs None    Preferred Learning Style No preference indicated    Learning Readiness Ready      Complications   Last HgB A1C per patient/outside source 11 %    How often do you check your blood sugar? 3-4 times/day    Fasting Blood glucose range (mg/dL) 180-200;130-179    Postprandial Blood glucose range (mg/dL) 70-129;130-179    Number of hypoglycemic episodes per month 0    Number of hyperglycemic episodes per week 0    Have you had a dental exam in the past 12 months? Yes      Dietary Intake   Breakfast 2 sausage links + 2 eggs + grapes + Glucerna    Lunch skipped    Dinner baked chicken + collard greens with smoked [place]  neck + creamed potatoes + water    Snack (evening) grapes    Beverage(s) water , Glucerna, Gatorade      Exercise   Exercise Type Light (walking / raking leaves)    How many days per week to you exercise? 3    How many minutes per day do you exercise? 60    Total minutes per week of exercise 180      Patient Education   Previous Diabetes Education Yes (please comment)      Individualized Goals (developed by patient)   Nutrition Follow meal plan discussed;General guidelines for healthy choices and portions discussed    Physical Activity Exercise 3-5 times per week;60 minutes per day    Medications take my medication as prescribed    Monitoring  test my blood glucose as discussed      Post-Education Assessment   Patient understands the diabetes disease and treatment process. Demonstrates understanding / competency    Patient understands incorporating nutritional management into lifestyle. Demonstrates understanding / competency    Patient undertands incorporating physical activity into lifestyle. Demonstrates understanding / competency    Patient understands using medications safely. Demonstrates understanding / competency    Patient understands monitoring blood glucose, interpreting and using results Demonstrates understanding / competency    Patient understands prevention, detection, and treatment  of acute complications. Demonstrates understanding / competency    Patient understands prevention, detection, and treatment of chronic complications. Demonstrates understanding / competency    Patient understands how to develop strategies to address psychosocial issues. Demonstrates understanding / competency    Patient understands how to develop strategies to promote health/change behavior. Demonstrates understanding / competency      Outcomes   Program Status Completed      Subsequent Visit   Since your last visit have you continued or begun to take your medications as prescribed? Yes     Since your last visit have you had your blood pressure checked? No    Since your last visit have you experienced any weight changes? Loss    Weight Loss (lbs) 6    Since your last visit, are you checking your blood glucose at least once a day? Yes           Learning Objective:  Patient will have a greater understanding of diabetes self-management. Patient education plan is to attend individual and/or group sessions per assessed needs and concerns.   Plan:   Patient Instructions  - Aim to have 3 meals a day.   - Continue to check blood sugar: fasting and 2 hours after meals. Blood sugar goals are: Fasting (80-130) and 2 hours after meals (less than 180).   - Balance meals with 1/2 plate non-starchy vegetables, 14 plate protein, and 1/4 plate starch/grain.   - When eating fruit, have with protein such as cashews or cheese.   - Check out diabetes support group. See flyer provdied for information.     Expected Outcomes:  Demonstrated interest in learning. Expect positive outcomes  Education material provided: ADA - How to Thrive: A Guide for Your Journey with Diabetes  If problems or questions, patient to contact team via:  Phone and Email  Future DSME appointment: - PRN

## 2020-07-01 NOTE — Patient Instructions (Addendum)
-   Aim to have 3 meals a day.   - Continue to check blood sugar: fasting and 2 hours after meals. Blood sugar goals are: Fasting (80-130) and 2 hours after meals (less than 180).   - Balance meals with 1/2 plate non-starchy vegetables, 14 plate protein, and 1/4 plate starch/grain.   - When eating fruit, have with protein such as cashews or cheese.   - Check out diabetes support group. See flyer provdied for information.

## 2020-07-03 ENCOUNTER — Other Ambulatory Visit: Payer: Self-pay

## 2020-07-03 ENCOUNTER — Encounter: Payer: Self-pay | Admitting: Internal Medicine

## 2020-07-03 ENCOUNTER — Ambulatory Visit: Payer: 59 | Attending: Internal Medicine | Admitting: Internal Medicine

## 2020-07-03 ENCOUNTER — Other Ambulatory Visit: Payer: Self-pay | Admitting: Internal Medicine

## 2020-07-03 VITALS — BP 138/74 | HR 85 | Resp 16 | Wt 133.2 lb

## 2020-07-03 DIAGNOSIS — E119 Type 2 diabetes mellitus without complications: Secondary | ICD-10-CM

## 2020-07-03 DIAGNOSIS — Z2821 Immunization not carried out because of patient refusal: Secondary | ICD-10-CM

## 2020-07-03 DIAGNOSIS — E1165 Type 2 diabetes mellitus with hyperglycemia: Secondary | ICD-10-CM | POA: Diagnosis not present

## 2020-07-03 DIAGNOSIS — Z1231 Encounter for screening mammogram for malignant neoplasm of breast: Secondary | ICD-10-CM

## 2020-07-03 DIAGNOSIS — F17200 Nicotine dependence, unspecified, uncomplicated: Secondary | ICD-10-CM | POA: Diagnosis not present

## 2020-07-03 DIAGNOSIS — Z794 Long term (current) use of insulin: Secondary | ICD-10-CM

## 2020-07-03 DIAGNOSIS — M722 Plantar fascial fibromatosis: Secondary | ICD-10-CM

## 2020-07-03 DIAGNOSIS — L602 Onychogryphosis: Secondary | ICD-10-CM

## 2020-07-03 DIAGNOSIS — R030 Elevated blood-pressure reading, without diagnosis of hypertension: Secondary | ICD-10-CM

## 2020-07-03 LAB — GLUCOSE, POCT (MANUAL RESULT ENTRY): POC Glucose: 337 mg/dl — AB (ref 70–99)

## 2020-07-03 MED ORDER — METFORMIN HCL 1000 MG PO TABS: 1000.0000 mg | ORAL_TABLET | Freq: Two times a day (BID) | ORAL | 2 refills | Status: DC

## 2020-07-03 MED FILL — METFORMIN HCL 1000 MG TABS: 1000 | 30 days supply | Qty: 60 | Fill #0

## 2020-07-03 NOTE — Patient Instructions (Addendum)
Increase Metformin to 1000 mg twice a day.  Check blood sugars 2-3 times a day before meals.  Bring log with you on next visit.

## 2020-07-03 NOTE — Progress Notes (Signed)
Patient ID: Tamara Barnes, female    DOB: [birth year]  MRN: [id number]  CC: Establish Care, Diabetes, and Fatigue   Subjective: [name] is a 54 y.o. female who presents for new pt visit. Young grandson is with her.  Her concerns today include:  Pt with hx of DM, tob dep, fibroids, vit D def  Patient was seen by the PA Freeman Caldron in August posthospitalization for pyelonephritis and new diagnosis of diabetes.  She presents today to establish care with me.  DIABETES TYPE 2 Last A1C:   Results for orders placed or performed in visit on 07/03/20  POCT glucose (manual entry)  Result Value Ref Range   POC Glucose 337 (A) 70 - 99 mg/dl    Lab Results  Component Value Date   HGBA1C 11.0 (H) 04/07/2020    Med Adherence:  [x]  Yes  Tolerating Metformin and Insulin 5 units twice a day.  Concern about affording med.  She has bright health insurance but states that the total cost of her medicines including the vitamin D is $102 with insurance. Medication side effects:  []  Yes    [x]  No Home Monitoring?  [x]  Yes  -TID right after meals Home glucose results range: 135-160.  BS 337 today; just ate chinese foods.  Saw RD and food it helpful. Diet Adherence:  Not able to eat what she wants due to teeth being loose. Plans to see a dentist.  Drinks diet sodas and water Exercise: [x]  Yes -goes to [employer] 2 x a wk.  Walks on treadmill for 1 hr.  Just order some New Balance DM shoes.  Hypoglycemic episodes?: []  Yes    []  No Numbness of the feet? []  Yes    [x]  No but soreness in feet.  Sharp pains at times.  Not working right now due to soreness in feet.  Sorensess goes and come.  Worse when she steps out of bed.  Gold Bond Lotion helps.  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  States she was told to a file for disability after hospitalization in August.  She has done so.  She is getting frustrated and waiting to hear back. Retinopathy hx? []  Yes    []  No Last eye exam: Due for  eye exam.  She does wear glasses. Comments:   Tob dep:  Smoking 3-4 cig a day.  Smoked since age 49.  1/2 pk a day. Quit for 1.5 mths; restarted after a bad breakup. Would like to get patches but can not afford right now.  She did call 1 [phone] quit now and they told her that she has to enrolled in a smoking cessation class but she does not want to do.  Patient Active Problem List   Diagnosis Date Noted  . Abdominal pain   . E coli bacteremia   . Type 2 diabetes mellitus without complication, without long-term current use of insulin ([place]) 04/07/2020  . Pyelonephritis 04/07/2020  . Sinusitis 07/04/2012  . Elevated BP 07/04/2012  . Rhinitis medicamentosa 07/04/2012  . FIBROIDS, UTERUS 02/24/2009  . UNSPECIFIED ABNORMAL MAMMOGRAM 02/24/2009  . 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 ANEMIA 06/04/2008  . SCIATICA 06/04/2008  . TOBACCO DEPENDENCE 11/10/2006     Current Outpatient Medications on File Prior to Visit  Medication Sig Dispense Refill  . insulin isophane & regular human (NOVOLIN 70/30 FLEXPEN RELION) (70-30) 100 UNIT/ML KwikPen Inject 5 Units into the skin 2 (two) times daily with a meal.  15 mL 3  . Insulin Pen Needle 32G X 4 MM MISC 1 each by Does not apply route in the morning and at bedtime. 100 each 3  . Vitamin D, Ergocalciferol, (DRISDOL) 1.25 MG (50000 UNIT) CAPS capsule Take 1 capsule (50,000 Units total) by mouth every 7 (seven) days. (Patient not taking: Reported on 07/03/2020) 16 capsule 0   No current facility-administered medications on file prior to visit.    Allergies  Allergen Reactions  . Oatmeal Shortness Of Breath and Other (See Comments)    Tongue swells  . Rice Shortness Of Breath, Swelling and Other (See Comments)    Tongue swells  . Wheat Bran Shortness Of Breath, Swelling and Other (See Comments)    Tongue swells    Social History   Socioeconomic History  . Marital status: Single    Spouse name: Not on file  . Number of children: Not on  file  . Years of education: Not on file  . Highest education level: Not on file  Occupational History  . Not on file  Tobacco Use  . Smoking status: Current Every Day Smoker    Packs/day: 0.25    Types: Cigarettes  . Smokeless tobacco: Never Used  Substance and Sexual Activity  . Alcohol use: Yes    Comment: socially  . Drug use: No  . Sexual activity: Not Currently    Birth control/protection: None  Other Topics Concern  . Not on file  Social History Narrative  . Not on file   Social Determinants of Health   Financial Resource Strain:   . Difficulty of Paying Living Expenses: Not on file  Food Insecurity:   . Worried About Charity fundraiser in the Last Year: Not on file  . Ran Out of Food in the Last Year: Not on file  Transportation Needs: No Transportation Needs  . Lack of Transportation (Medical): No  . Lack of Transportation (Non-Medical): No  Physical Activity:   . Days of Exercise per Week: Not on file  . Minutes of Exercise per Session: Not on file  Stress:   . Feeling of Stress : Not on file  Social Connections:   . Frequency of Communication with Friends and Family: Not on file  . Frequency of Social Gatherings with Friends and Family: Not on file  . Attends Religious Services: Not on file  . Active Member of Clubs or Organizations: Not on file  . Attends Archivist Meetings: Not on file  . Marital Status: Not on file  Intimate Partner Violence:   . Fear of Current or Ex-Partner: Not on file  . Emotionally Abused: Not on file  . Physically Abused: Not on file  . Sexually Abused: Not on file    Family History  Problem Relation Age of Onset  . Kidney failure Mother   . Heart disease Mother   . Liver disease Father     Past Surgical History:  Procedure Laterality Date  . FRACTURE SURGERY    . tuabl ligation    . TUBAL LIGATION      ROS: Review of Systems Negative except as stated above  PHYSICAL EXAM: BP 138/74   Pulse 85    Resp 16   Wt 133 lb 3.2 oz (60.4 kg)   LMP 01/14/2015   SpO2 98%   BMI 23.60 kg/m   Physical Exam  General appearance - alert, well appearing, middle-aged African-American female and in no distress Mental status - normal mood, behavior,  speech, dress, motor activity, and thought processes Nose - normal and patent, no erythema, discharge or polyps Mouth - mucous membranes moist, pharynx normal without lesions Neck - supple, no significant adenopathy Chest - clear to auscultation, no wheezes, rales or rhonchi, symmetric air entry Heart - normal rate, regular rhythm, normal S1, S2.  Soft 1/6 systolic ejection murmur heard at the PMI. Extremities - peripheral pulses normal, no pedal edema, no clubbing or cyanosis Diabetic Foot Exam - Simple   Simple Foot Form Visual Inspection See comments: Yes Sensation Testing Intact to touch and monofilament testing bilaterally: Yes Pulse Check Posterior Tibialis and Dorsalis pulse intact bilaterally: Yes Comments Toenails are slightly overgrown.  She is flat-footed.  No tenderness on palpation of the dorsal surface of the feet.      CMP Latest Ref Rng & Units 05/01/2020 04/09/2020 04/08/2020  Glucose 65 - 99 mg/dL 147(H) 227(H) 282(H)  BUN 6 - 24 mg/dL 12 9 12   Creatinine 0.57 - 1.00 mg/dL 0.54(L) 0.63 0.82  Sodium 134 - 144 mmol/L 141 133(L) 131(L)  Potassium 3.5 - 5.2 mmol/L 4.3 3.3(L) 3.8  Chloride 96 - 106 mmol/L 103 96(L) 99  CO2 20 - 29 mmol/L 27 22 20(L)  Calcium 8.7 - 10.2 mg/dL 9.8 9.0 8.8(L)  Total Protein 6.0 - 8.5 g/dL 6.7 6.1(L) -  Total Bilirubin 0.0 - 1.2 mg/dL 0.4 0.7 -  Alkaline Phos 48 - 121 IU/L 82 81 -  AST 0 - 40 IU/L 18 14(L) -  ALT 0 - 32 IU/L 9 13 -   Lipid Panel     Component Value Date/Time   CHOL 153 06/04/2008 2136   TRIG 223 (H) 06/04/2008 2136   HDL 57 06/04/2008 2136   CHOLHDL 2.7 Ratio 06/04/2008 2136   VLDL 45 (H) 06/04/2008 2136   LDLCALC 51 06/04/2008 2136    CBC    Component Value Date/Time    WBC 6.3 05/01/2020 1550   WBC 10.2 04/09/2020 1048   RBC 4.75 05/01/2020 1550   RBC 4.30 04/09/2020 1048   HGB 14.2 05/01/2020 1550   HCT 42.3 05/01/2020 1550   PLT 228 05/01/2020 1550   MCV 89 05/01/2020 1550   MCH 29.9 05/01/2020 1550   MCH 29.1 04/09/2020 1048   MCHC 33.6 05/01/2020 1550   MCHC 32.5 04/09/2020 1048   RDW 12.3 05/01/2020 1550   LYMPHSABS 1.3 05/01/2020 1550   EOSABS 0.1 05/01/2020 1550   BASOSABS 0.0 05/01/2020 1550    ASSESSMENT AND PLAN: 1. Type 2 diabetes mellitus with hyperglycemia, with long-term current use of insulin (HCC) Patient to check blood sugars at least twice a day before meals with goal being 90-130.  If she checks after a meal, it should be 2 hours after a meal and the goal is to be less than 180.  She will keep a log of blood sugars and bring them with her in 2 weeks to see the clinical pharmacist.  Please check a point-of-care A1c on that visit. -Increase Metformin to 1000 mg twice a day.  Continue current dose of insulin. Discussed and encourage healthy eating habits and regular exercise. - POCT glucose (manual entry) - Microalbumin / creatinine urine ratio - metFORMIN (GLUCOPHAGE) 1000 MG tablet; Take 1 tablet (1,000 mg total) by mouth 2 (two) times daily with a meal.  Dispense: 60 tablet; Refill: 2 - Ambulatory referral to Ophthalmology - Lipid panel  2. Tobacco dependence Advised to quit.  Discussed health risks associated with smoking.  Patient wanting  to quit.  She will try calling 1 [phone] quit now again to let them know that she just want to get the nicotine patches and does not wish to attend the classes.  Less than 5 minutes spent on counseling.  3. Plantar fasciitis Discussed diagnosis and conservative management.  It is good that she is ordered a pair of new balance tennis which she states she will be receiving soon.  I also discussed with her doing heel exercises like rubbing the foot back-and-forth over a tennis ball when she is  sitting down.  If no improvement we can refer her to podiatry  4. Overgrown nail Patient encouraged to clip her nails  5. Elevated blood-pressure reading without diagnosis of hypertension Denies previous history of elevated blood pressure.  DASH diet discussed and encouraged.  We will recheck blood pressure on follow-up visit  6. Influenza vaccination declined This was offered.  Patient declined  7. 23-polyvalent pneumococcal polysaccharide vaccine declined This was offered.  Patient declined  8. Encounter for screening mammogram for malignant neoplasm of breast - MM Digital Screening; Future   Patient was given the opportunity to ask questions.  Patient verbalized understanding of the plan and was able to repeat key elements of the plan.   Orders Placed This Encounter  Procedures  . MM Digital Screening  . Microalbumin / creatinine urine ratio  . Lipid panel  . Ambulatory referral to Ophthalmology  . POCT glucose (manual entry)     Requested Prescriptions   Signed Prescriptions Disp Refills  . metFORMIN (GLUCOPHAGE) 1000 MG tablet 60 tablet 2    Sig: Take 1 tablet (1,000 mg total) by mouth 2 (two) times daily with a meal.    Return in about 6 weeks (around 08/14/2020), or for pap, for Give appt with [facility] in 1-2 wks to recheck blood sugar.  [name], MD, FACP

## 2020-07-04 LAB — LIPID PANEL
Chol/HDL Ratio: 2 ratio (ref 0.0–4.4)
Cholesterol, Total: 161 mg/dL (ref 100–199)
HDL: 82 mg/dL (ref >39)
LDL Chol Calc (NIH): 61 mg/dL (ref 0–99)
Triglycerides: 102 mg/dL (ref 0–149)
VLDL Cholesterol Cal: 18 mg/dL (ref 5–40)

## 2020-07-04 LAB — MICROALBUMIN / CREATININE URINE RATIO
Creatinine, Urine: 206.1 mg/dL
Microalb/Creat Ratio: 21 mg/g creat (ref 0–29)
Microalbumin, Urine: 43.6 ug/mL

## 2020-07-04 NOTE — Progress Notes (Signed)
Normal result letter generated and mailed to address on file.

## 2020-07-10 ENCOUNTER — Ambulatory Visit: Payer: 59 | Admitting: Pharmacist

## 2020-07-18 ENCOUNTER — Other Ambulatory Visit: Payer: Self-pay | Admitting: Internal Medicine

## 2020-07-18 ENCOUNTER — Other Ambulatory Visit: Payer: Self-pay

## 2020-07-18 ENCOUNTER — Telehealth: Payer: Self-pay | Admitting: Internal Medicine

## 2020-07-18 DIAGNOSIS — N6320 Unspecified lump in the left breast, unspecified quadrant: Secondary | ICD-10-CM

## 2020-07-18 NOTE — Patient Outreach (Signed)
[facility]) Care Management  07/18/2020  Tamara Barnes 1966-02-08 [id number]   Case closure:  Placed call to patient who reports to me that she is doing great. Reports CBG levels under 130. Reports she continues to follow her diet. Reports she is taking Metformin 1053m twice a day and insulin 5 units at meals. Reports she has 4 days left of insulin and can not afford to pay for it. Reports no additional concerns.   PLAN: nursing goals met and DM under control per patient. [facility].  ATomasa Rand RN, BSN, CEN TDes ArcCoordinator [phone]

## 2020-07-18 NOTE — Patient Outreach (Signed)
Received a Pharmacy referral. I have sent the referral to the [facility] team through [facility].

## 2020-07-18 NOTE — Telephone Encounter (Signed)
Patient is calling to get a referral for a mammogram.  She stated that she saw the doctor in October, but did not notice anything wrong at that time.  Since then she has notice something in her breast and was told that she would need a referral.  Please advise and call patient to let her know when the referral is placed.  CB# [phone]

## 2020-07-21 NOTE — Telephone Encounter (Signed)
Called pt stated her left breast lump got bigger/

## 2020-07-21 NOTE — Telephone Encounter (Signed)
MMG referral submitted for diagnostic MMG.

## 2020-07-23 ENCOUNTER — Telehealth: Payer: Self-pay

## 2020-07-23 NOTE — Telephone Encounter (Signed)
Patient was requesting assistance affording her Novolin 70/30 insulin.  Patient assistance is only offered through [employer] for the vials, patient refused the vials.  Humulin 70/30 pen is offered through the [facility], patient refused Humulin.  I told patient that I found [facility] offers their Novolin 70/30 pens at around $45ish and her copay is $51 and change.  Advised patient that I was unable to help her with patient assistance at this time if she does not want to use vials or change therapy.  We do not have samples.

## 2020-08-04 ENCOUNTER — Telehealth: Payer: Self-pay | Admitting: Internal Medicine

## 2020-08-04 NOTE — Telephone Encounter (Signed)
M[facility]ory could you please reach out to pt and see what she is referring to

## 2020-08-04 NOTE — Telephone Encounter (Signed)
Copied from [place] [phone]. Topic: General - Other >> Aug 01, 2020 12:48 PM Rainey Pines A wrote: Patient requesting callback from Dr. Bard Herbert nurse in regards to her not being able to be seen at office where she was referred to due to them asking for medical records from 2 years ago from an office in [place] that is no longer open Patient wants to know what pcp would advise.

## 2020-08-14 ENCOUNTER — Ambulatory Visit (INDEPENDENT_AMBULATORY_CARE_PROVIDER_SITE_OTHER): Payer: 59 | Admitting: Ophthalmology

## 2020-08-14 ENCOUNTER — Encounter (INDEPENDENT_AMBULATORY_CARE_PROVIDER_SITE_OTHER): Payer: Self-pay | Admitting: Ophthalmology

## 2020-08-14 ENCOUNTER — Other Ambulatory Visit: Payer: Self-pay

## 2020-08-14 DIAGNOSIS — H2513 Age-related nuclear cataract, bilateral: Secondary | ICD-10-CM | POA: Insufficient documentation

## 2020-08-14 DIAGNOSIS — E113491 Type 2 diabetes mellitus with severe nonproliferative diabetic retinopathy without macular edema, right eye: Secondary | ICD-10-CM | POA: Insufficient documentation

## 2020-08-14 DIAGNOSIS — H43823 Vitreomacular adhesion, bilateral: Secondary | ICD-10-CM | POA: Insufficient documentation

## 2020-08-14 DIAGNOSIS — E113392 Type 2 diabetes mellitus with moderate nonproliferative diabetic retinopathy without macular edema, left eye: Secondary | ICD-10-CM

## 2020-08-14 NOTE — Patient Instructions (Signed)
Pt instructed to seek general eye doctor to be refracted for glasses.

## 2020-08-14 NOTE — Assessment & Plan Note (Signed)
Mild OU 

## 2020-08-14 NOTE — Assessment & Plan Note (Addendum)
The nature of diabetic retinopathy was explained using the following analogy: "Retinopathy develops in the body's blood supply like salty water corrodes the linings of pipes in a house, until rust appears, then holes in the pipes develop which leak followed by destruction and loss of the pipes as the corrosion turns them to dust. In a similar fashion, Diabetes damages the blood supply of the body by cumulative long--term elevated blood sugar, which corrodes the blood supply in the body, particularly the blood vessels supplying the retina, kidneys, and nerves".  Thus, control of blood sugar, slows the progression of the corrosive effect of diabetes mellitus.  The nature of severe nonproliferative diabetic retinopathy discussed with the patient as well as the need for more frequent follow up and likely progression to proliferative disease in the near future. The options of continued observation versus panretinal photocoagulation at this time were reviewed as well as the risks, benefits, and alternatives. More recent option includes the use of ocular injectable medications to slow progression of retinal disease. Tight control of glucose, blood pressure, and serum lipid levels were recommended under the direction of general physician or endocrinologist, as well as avoidance of smoking and maintenance of normal body weight. The 2-year risk of progression to proliferative diabetic retinopathy is 60%.

## 2020-08-14 NOTE — Progress Notes (Signed)
08/14/2020     CHIEF COMPLAINT Patient presents for Retina Evaluation   HISTORY OF PRESENT ILLNESS: Tamara Barnes is a 54 y.o. female who presents to the clinic today for:   HPI    Retina Evaluation    In both eyes.  This started 4 months ago.  Duration of 4 months.  Associated Symptoms Pain.  Context:  distance vision.          Comments    NP DM Exam, ref'd by Wynetta Emery.    Pt reports blurry vision NVA and DVA OU, pt reports pain in OD.      Last A1C: around 9 taken 06/2020    Last BS: 138 this AM        Last edited by Nichola Sizer D on 08/14/2020 10:38 AM. (History)      Referring physician: Ladell Pier, MD [place],  [place] [address]  HISTORICAL INFORMATION:   Selected notes from the MEDICAL RECORD NUMBER    Lab Results  Component Value Date   HGBA1C 11.0 (H) 04/07/2020     CURRENT MEDICATIONS: No current outpatient medications on file. (Ophthalmic Drugs)   No current facility-administered medications for this visit. (Ophthalmic Drugs)   Current Outpatient Medications (Other)  Medication Sig  . insulin isophane & regular human (NOVOLIN 70/30 FLEXPEN RELION) (70-30) 100 UNIT/ML KwikPen Inject 5 Units into the skin 2 (two) times daily with a meal.  . Insulin Pen Needle 32G X 4 MM MISC 1 each by Does not apply route in the morning and at bedtime.  . metFORMIN (GLUCOPHAGE) 1000 MG tablet Take 1 tablet (1,000 mg total) by mouth 2 (two) times daily with a meal.  . Vitamin D, Ergocalciferol, (DRISDOL) 1.25 MG (50000 UNIT) CAPS capsule Take 1 capsule (50,000 Units total) by mouth every 7 (seven) days. (Patient not taking: Reported on 07/03/2020)   No current facility-administered medications for this visit. (Other)      REVIEW OF SYSTEMS:    ALLERGIES Allergies  Allergen Reactions  . Oatmeal Shortness Of Breath and Other (See Comments)    Tongue swells  . Rice Shortness Of Breath, Swelling and Other (See Comments)     Tongue swells  . Wheat Bran Shortness Of Breath, Swelling and Other (See Comments)    Tongue swells    PAST MEDICAL HISTORY Past Medical History:  Diagnosis Date  . Diabetes mellitus without complication ([place])   . Hypertension    Past Surgical History:  Procedure Laterality Date  . FRACTURE SURGERY    . tuabl ligation    . TUBAL LIGATION      FAMILY HISTORY Family History  Problem Relation Age of Onset  . Kidney failure Mother   . Heart disease Mother   . Liver disease Father     SOCIAL HISTORY Social History   Tobacco Use  . Smoking status: Current Every Day Smoker    Packs/day: 0.25    Types: Cigarettes  . Smokeless tobacco: Never Used  Substance Use Topics  . Alcohol use: Yes    Comment: socially  . Drug use: No         OPHTHALMIC EXAM:  Base Eye Exam    Visual Acuity (ETDRS)      Right Left   Dist [place] 20/50 -2 20/50 +1   Dist ph [place] 20/25 20/30 -1       Tonometry (Tonopen, 10:45 AM)      Right Left   Pressure 19 24  2 attempts OS        Pupils      Pupils Dark Light Shape React APD   Right PERRL 4 3 Round Brisk None   Left PERRL 4 3 Round Brisk None       Visual Fields (Counting fingers)      Left Right    Full Full       Extraocular Movement      Right Left    Full Full       Neuro/Psych    Oriented x3: Yes       Dilation    Both eyes: 1.0% Mydriacyl, 2.5% Phenylephrine @ 10:45 AM        Slit Lamp and Fundus Exam    External Exam      Right Left   External Normal Normal       Slit Lamp Exam      Right Left   Lids/Lashes Normal Normal   Conjunctiva/Sclera White and quiet White and quiet   Cornea Clear Clear   Anterior Chamber Deep and quiet Deep and quiet   Iris Round and reactive Round and reactive   Lens 1+ Nuclear sclerosis 1+ Nuclear sclerosis   Anterior Vitreous Normal Normal       Fundus Exam      Right Left   Posterior Vitreous Normal Normal   Disc Normal Normal   C/D Ratio 0.5 0.4   Macula  Microaneurysms, no clinically significant macular edema, no macular thickening Microaneurysms, no clinically significant macular edema, no macular thickening   Vessels  NPDR-Severe NPDR- Moderate   Periphery Normal Normal          IMAGING AND PROCEDURES  Imaging and Procedures for 08/14/20  OCT, Retina - OU - Both Eyes       Right Eye Quality was good. Scan locations included subfoveal. Central Foveal Thickness: 267. Progression has no prior data. Findings include vitreomacular adhesion .   Left Eye Quality was good. Scan locations included subfoveal. Central Foveal Thickness: 273. Progression has no prior data. Findings include vitreomacular adhesion .   Notes No active maculopathy in either eye.                ASSESSMENT/PLAN:  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) The nature of diabetic retinopathy was explained using the following analogy: "Retinopathy develops in the body's blood supply like salty water corrodes the linings of pipes in a house, until rust appears, then holes in the pipes develop which leak followed by destruction and loss of the pipes as the corrosion turns them to dust. In a similar fashion, Diabetes damages the blood supply of the body by cumulative long--term elevated blood sugar, which corrodes the blood supply in the body, particularly the blood vessels supplying the retina, kidneys, and nerves".  Thus, control of blood sugar, slows the progression of the corrosive effect of diabetes mellitus.  The nature of severe nonproliferative diabetic retinopathy discussed with the patient as well as the need for more frequent follow up and likely progression to proliferative disease in the near future. The options of continued observation versus panretinal photocoagulation at this time were reviewed as well as the risks, benefits, and alternatives. More recent option includes the use of ocular injectable medications to slow progression  of retinal disease. Tight control of glucose, blood pressure, and serum lipid levels were recommended under the direction of general physician or endocrinologist, as well as avoidance of smoking and maintenance of normal body  weight. The 2-year risk of progression to proliferative diabetic retinopathy is 60%.     Nuclear sclerotic cataract of both eyes Mild OU  Vitreomacular adhesion of both eyes Minor with no foveal distortion, normal      ICD-10-CM   1. Severe nonproliferative diabetic retinopathy of right eye without macular edema associated with type 2 diabetes mellitus (HCC)  E11.3491 OCT, Retina - OU - Both Eyes  2. 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 Nuclear sclerotic cataract of both eyes  H25.13   4. Vitreomacular adhesion of both eyes  H43.823     1.  I explained to the patient to have this level of retinopathy in 1 or the other eye that a minimum of blood sugar normality must have been in place for at least 10 years.  I explained to her the salt water through the pipe story as mentioned in the note  2.  Understands that recent blood sugar control and improvement uncovers a refractive requirement for distance acuity.  For this reason she needs general eye care for refraction and vision testing  3.  I explained we will continue to monitor her retinopathy closely here to look for signs of rapid progression, but currently no specific therapy is warranted  Ophthalmic Meds Ordered this visit:  No orders of the defined types were placed in this encounter.      Return in about 4 months (around 12/13/2020) for DILATE OU, COLOR FP, OCT.  Patient Instructions  Pt instructed to seek general eye doctor to be refracted for glasses.    Explained the diagnoses, plan, and follow up with the patient and they expressed understanding.  Patient expressed  understanding of the importance of proper follow up care.   Clent Demark Khamauri Bauernfeind M.D. Diseases & Surgery of the Retina and Vitreous [facility] [place] 08/14/20     Abbreviations: M myopia (nearsighted); A astigmatism; H hyperopia (farsighted); P presbyopia; Mrx spectacle prescription;  CTL contact lenses; OD right eye; OS left eye; OU both eyes  XT exotropia; ET esotropia; PEK punctate epithelial keratitis; PEE punctate epithelial erosions; DES dry eye syndrome; MGD meibomian gland dysfunction; ATs artificial tears; PFAT's preservative free artificial tears; [place] nuclear sclerotic cataract; PSC posterior subcapsular cataract; ERM epi-retinal membrane; PVD posterior vitreous detachment; RD retinal detachment; DM diabetes mellitus; DR diabetic retinopathy; NPDR non-proliferative diabetic retinopathy; PDR proliferative diabetic retinopathy; CSME clinically significant macular edema; DME diabetic macular edema; dbh dot blot hemorrhages; CWS cotton wool spot; POAG primary open angle glaucoma; C/D cup-to-disc ratio; HVF humphrey visual field; GVF goldmann visual field; OCT optical coherence tomography; IOP intraocular pressure; BRVO Branch retinal vein occlusion; CRVO central retinal vein occlusion; CRAO central retinal artery occlusion; BRAO branch retinal artery occlusion; RT retinal tear; SB scleral buckle; PPV pars plana vitrectomy; VH Vitreous hemorrhage; PRP panretinal laser photocoagulation; IVK intravitreal kenalog; VMT vitreomacular traction; MH Macular hole;  NVD neovascularization of the disc; NVE neovascularization elsewhere; AREDS age related eye disease study; ARMD age related macular degeneration; POAG primary open angle glaucoma; EBMD epithelial/anterior basement membrane dystrophy; ACIOL anterior chamber intraocular lens; IOL intraocular lens; PCIOL posterior chamber intraocular lens; Phaco/IOL phacoemulsification with intraocular lens placement; [place] photorefractive keratectomy; LASIK laser  assisted in situ keratomileusis; HTN hypertension; DM diabetes mellitus; COPD chronic obstructive pulmonary disease

## 2020-08-14 NOTE — Assessment & Plan Note (Signed)
Minor with no foveal distortion, normal

## 2020-08-15 NOTE — Telephone Encounter (Signed)
Patient went to her appointment at the diabetic and nutritionist office Dr. Wynetta Emery referred her to. Provider asked how long it had been since her last visit with a nutritionist and patient stated about 5 years ago. Provider stated patient could not be seen until medical records from the previous clinic were obtained. Previous clinic is located in [place], [place] and is permanently closed due to [place]. Patient needs medical records from this office in order to be seen at the nutritionist PCP referred her to. Please follow up.

## 2020-08-15 NOTE — Telephone Encounter (Signed)
Would you be able to assist with this?

## 2020-08-21 ENCOUNTER — Other Ambulatory Visit: Payer: Self-pay

## 2020-08-21 ENCOUNTER — Other Ambulatory Visit: Payer: Self-pay | Admitting: Internal Medicine

## 2020-08-21 ENCOUNTER — Encounter: Payer: Self-pay | Admitting: Internal Medicine

## 2020-08-21 ENCOUNTER — Other Ambulatory Visit (HOSPITAL_COMMUNITY)
Admission: RE | Admit: 2020-08-21 | Discharge: 2020-08-21 | Disposition: A | Discharge status: Home / Self Care | Payer: 59 | Source: Ambulatory Visit | Attending: Internal Medicine | Admitting: Internal Medicine

## 2020-08-21 ENCOUNTER — Ambulatory Visit: Payer: 59 | Attending: Internal Medicine | Admitting: Internal Medicine

## 2020-08-21 VITALS — BP 130/78 | HR 81 | Temp 98.5°F | Resp 16 | Wt 132.6 lb

## 2020-08-21 DIAGNOSIS — N6320 Unspecified lump in the left breast, unspecified quadrant: Secondary | ICD-10-CM

## 2020-08-21 DIAGNOSIS — R030 Elevated blood-pressure reading, without diagnosis of hypertension: Secondary | ICD-10-CM

## 2020-08-21 DIAGNOSIS — Z23 Encounter for immunization: Secondary | ICD-10-CM | POA: Diagnosis not present

## 2020-08-21 DIAGNOSIS — F17200 Nicotine dependence, unspecified, uncomplicated: Secondary | ICD-10-CM

## 2020-08-21 DIAGNOSIS — Z1211 Encounter for screening for malignant neoplasm of colon: Secondary | ICD-10-CM

## 2020-08-21 DIAGNOSIS — Z124 Encounter for screening for malignant neoplasm of cervix: Secondary | ICD-10-CM | POA: Insufficient documentation

## 2020-08-21 NOTE — Patient Instructions (Signed)
Td (Tetanus, Diphtheria) Vaccine: What You Need to Know 1. Why get vaccinated? Td vaccine can prevent tetanus and diphtheria. Tetanus enters the body through cuts or wounds. Diphtheria spreads from person to person.  TETANUS (T) causes painful stiffening of the muscles. Tetanus can lead to serious health problems, including being unable to open the mouth, having trouble swallowing and breathing, or death.  DIPHTHERIA (D) can lead to difficulty breathing, heart failure, paralysis, or death. 2. Td vaccine Td is only for children 7 years and older, adolescents, and adults.  Td is usually given as a booster dose every 10 years, but it can also be given earlier after a severe and dirty wound or burn. Another vaccine, called Tdap, that protects against pertussis, also known as "whooping cough," in addition to tetanus and diphtheria, may be used instead of Td.  Td may be given at the same time as other vaccines. 3. Talk with your health care provider Tell your vaccine provider if the person getting the vaccine:  Has had an allergic reaction after a previous dose of any vaccine that protects against tetanus or diphtheria, or has any severe, life-threatening allergies.  Has ever had Guillain-Barr Syndrome (also called GBS).  Has had severe pain or swelling after a previous dose of any vaccine that protects against tetanus or diphtheria. In some cases, your health care provider may decide to postpone Td vaccination to a future visit.  People with minor illnesses, such as a cold, may be vaccinated. People who are moderately or severely ill should usually wait until they recover before getting Td vaccine.  Your health care provider can give you more information. 4. Risks of a vaccine reaction  Pain, redness, or swelling where the shot was given, mild fever, headache, feeling tired, and nausea, vomiting, diarrhea, or stomachache sometimes happen after Td vaccine. People sometimes faint after medical  procedures, including vaccination. Tell your provider if you feel dizzy or have vision changes or ringing in the ears.  As with any medicine, there is a very remote chance of a vaccine causing a severe allergic reaction, other serious injury, or death. 5. What if there is a serious problem? An allergic reaction could occur after the vaccinated person leaves the clinic. If you see signs of a severe allergic reaction (hives, swelling of the face and throat, difficulty breathing, a fast heartbeat, dizziness, or weakness), call 9-1-1 and get the person to the nearest hospital.  For other signs that concern you, call your health care provider.  Adverse reactions should be reported to the Vaccine Adverse Event Reporting System (VAERS). Your health care provider will usually file this report, or you can do it yourself. Visit the VAERS website at www.vaers.hhs.gov or call 1-800-822-7967. VAERS is only for reporting reactions, and VAERS staff do not give medical advice. 6. The National Vaccine Injury Compensation Program The National Vaccine Injury Compensation Program (VICP) is a federal program that was created to compensate people who may have been injured by certain vaccines. Visit the VICP website at www.hrsa.gov/vaccinecompensation or call 1-800-338-2382 to learn about the program and about filing a claim. There is a time limit to file a claim for compensation. 7. How can I learn more?  Ask your health care provider.  Call your local or state health department.  Contact the Centers for Disease Control and Prevention (CDC): ? Call 1-800-232-4636 (1-800-CDC-INFO) or ? Visit CDC's website at www.cdc.gov/vaccines Vaccine Information Statement Td Vaccine (12/13/18) This information is not intended to replace advice given   to you by your health care provider. Make sure you discuss any questions you have with your health care provider. Document Revised: 01/22/2019 Document Reviewed: 12/25/2018 Elsevier  Patient Education  2020 Elsevier Inc.  

## 2020-08-21 NOTE — Progress Notes (Signed)
Patient ID: Tamara Barnes, female    DOB: 1966/03/15  MRN: [id number]  CC: Gynecologic Exam   Subjective: Tamara Barnes is a 54 y.o. female who presents for pap Her concerns today include:  Pt with hx of DM bilateral nonproliferative diabetic retinopathy, tob dep, fibroids, vit D def  G2P2 Last pap was 6 yrs ago No vaginal discgh. No previous STI.  Sexually active with one female partner.   She is postmenopausal  X 6 yrs No fhx of breast, ovarian or uterine MMG ordered on last visit.  Patient states they refused to schedule the appointment stating that they need to get her old images from [place].  Last MMG 6-7 yrs ago in [place].  She does not recall the institution where she had the mammogram done and states that she told the person that who called her to schedule her mammogram but they still refused to schedule it. -Reports having tender mass in LT breast x 47mth.  It has since dec in size and not as tender. Gives hx of cyst in RT breast dx in 2009. Resolved on its own.   Elev BP on last visit.  She has been limiting salt in the foods as advised and eating more fresh fruits and vegetables  Tob dep: did call 1-800-Quit Now.  Reports being told they "just give out suggestions."  She admits that she specifically did not ask for nicotine replacement therapy.  Still smoking but only at 2-3 cigarettes a day.  She is still wanting to quit.  Smoked since age 55.  At heaviest she was 1/2 pk a day  DM: checking BS before meals.  Reports readings 102-120.  HM:  Due for c-scope.  Never had one.  No fhx of colon cancer.  Due for tdap  Patient Active Problem List   Diagnosis Date Noted  . Severe nonproliferative diabetic retinopathy of right eye ([place]) 08/14/2020  . 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 Nuclear sclerotic cataract of both eyes 08/14/2020  . Vitreomacular adhesion of both eyes 08/14/2020  . Abdominal pain   . E coli bacteremia   .  Pyelonephritis 04/07/2020  . Sinusitis 07/04/2012  . Elevated BP 07/04/2012  . Rhinitis medicamentosa 07/04/2012  . FIBROIDS, UTERUS 02/24/2009  . UNSPECIFIED ABNORMAL MAMMOGRAM 02/24/2009  . 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 ANEMIA 06/04/2008  . SCIATICA 06/04/2008  . TOBACCO DEPENDENCE 11/10/2006     Current Outpatient Medications on File Prior to Visit  Medication Sig Dispense Refill  . insulin isophane & regular human (NOVOLIN 70/30 FLEXPEN RELION) (70-30) 100 UNIT/ML KwikPen Inject 5 Units into the skin 2 (two) times daily with a meal. 15 mL 3  . Insulin Pen Needle 32G X 4 MM MISC 1 each by Does not apply route in the morning and at bedtime. 100 each 3  . metFORMIN (GLUCOPHAGE) 1000 MG tablet Take 1 tablet (1,000 mg total) by mouth 2 (two) times daily with a meal. 60 tablet 2  . Vitamin D, Ergocalciferol, (DRISDOL) 1.25 MG (50000 UNIT) CAPS capsule Take 1 capsule (50,000 Units total) by mouth every 7 (seven) days. (Patient not taking: Reported on 07/03/2020) 16 capsule 0   No current facility-administered medications on file prior to visit.    Allergies  Allergen Reactions  . Oatmeal Shortness Of Breath and Other (See Comments)    Tongue swells  . Rice Shortness Of Breath, Swelling and Other (See Comments)    Tongue swells  .  Wheat Bran Shortness Of Breath, Swelling and Other (See Comments)    Tongue swells    Social History   Socioeconomic History  . Marital status: Single    Spouse name: Not on file  . Number of children: Not on file  . Years of education: Not on file  . Highest education level: Not on file  Occupational History  . Not on file  Tobacco Use  . Smoking status: Current Every Day Smoker    Packs/day: 0.25    Types: Cigarettes  . Smokeless tobacco: Never Used  Substance and Sexual Activity  . Alcohol use: Yes    Comment: socially  . Drug use: No  . Sexual activity: Not Currently    Birth control/protection: None  Other Topics Concern   . Not on file  Social History Narrative  . Not on file   Social Determinants of Health   Financial Resource Strain: Not on file  Food Insecurity: Not on file  Transportation Needs: No Transportation Needs  . Lack of Transportation (Medical): No  . Lack of Transportation (Non-Medical): No  Physical Activity: Not on file  Stress: Not on file  Social Connections: Not on file  Intimate Partner Violence: Not on file    Family History  Problem Relation Age of Onset  . Kidney failure Mother   . Heart disease Mother   . Liver disease Father     Past Surgical History:  Procedure Laterality Date  . FRACTURE SURGERY    . tuabl ligation    . TUBAL LIGATION      ROS: Review of Systems Negative except as stated above  PHYSICAL EXAM: BP 130/78   Pulse 81   Temp 98.5 F (36.9 C)   Resp 16   Wt 132 lb 9.6 oz (60.1 kg)   LMP 01/14/2015   SpO2 98%   BMI 23.49 kg/m   Wt Readings from Last 3 Encounters:  08/21/20 132 lb 9.6 oz (60.1 kg)  07/03/20 133 lb 3.2 oz (60.4 kg)  05/23/20 133 lb 3.2 oz (60.4 kg)    Physical Exam  General appearance - alert, well appearing, and in no distress Mental status - normal mood, behavior, speech, dress, motor activity, and thought processes Chest - clear to auscultation, no wheezes, rales or rhonchi, symmetric air entry Heart -low rate rhythm.  No gallops.  Soft systolic ejection murmur heard at PMI. Breasts -CMA Maryjean Ka present for breast and pelvic exam: no axillary lymphadenopathy.  No palpable masses felt in the right breast.  She has firmness/questionable mass behind the left nipple.  No nipple discharge on either side. Pelvic - normal external genitalia, vulva, vagina, cervix, uterus and adnexa Extremities -mild clubbing of the fingernails CMP Latest Ref Rng & Units 05/01/2020 04/09/2020 04/08/2020  Glucose 65 - 99 mg/dL 147(H) 227(H) 282(H)  BUN 6 - 24 mg/dL 12 9 12   Creatinine 0.57 - 1.00 mg/dL 0.54(L) 0.63 0.82  Sodium 134 - 144  mmol/L 141 133(L) 131(L)  Potassium 3.5 - 5.2 mmol/L 4.3 3.3(L) 3.8  Chloride 96 - 106 mmol/L 103 96(L) 99  CO2 20 - 29 mmol/L 27 22 20(L)  Calcium 8.7 - 10.2 mg/dL 9.8 9.0 8.8(L)  Total Protein 6.0 - 8.5 g/dL 6.7 6.1(L) -  Total Bilirubin 0.0 - 1.2 mg/dL 0.4 0.7 -  Alkaline Phos 48 - 121 IU/L 82 81 -  AST 0 - 40 IU/L 18 14(L) -  ALT 0 - 32 IU/L 9 13 -   Lipid Panel  Component Value Date/Time   CHOL 161 07/03/2020 1640   TRIG 102 07/03/2020 1640   HDL 82 07/03/2020 1640   CHOLHDL 2.0 07/03/2020 1640   CHOLHDL 2.7 Ratio 06/04/2008 2136   VLDL 45 (H) 06/04/2008 2136   LDLCALC 61 07/03/2020 1640    CBC    Component Value Date/Time   WBC 6.3 05/01/2020 1550   WBC 10.2 04/09/2020 1048   RBC 4.75 05/01/2020 1550   RBC 4.30 04/09/2020 1048   HGB 14.2 05/01/2020 1550   HCT 42.3 05/01/2020 1550   PLT 228 05/01/2020 1550   MCV 89 05/01/2020 1550   MCH 29.9 05/01/2020 1550   MCH 29.1 04/09/2020 1048   MCHC 33.6 05/01/2020 1550   MCHC 32.5 04/09/2020 1048   RDW 12.3 05/01/2020 1550   LYMPHSABS 1.3 05/01/2020 1550   EOSABS 0.1 05/01/2020 1550   BASOSABS 0.0 05/01/2020 1550    ASSESSMENT AND PLAN: 1. Pap smear for cervical cancer screening - Cytology - PAP - Cervicovaginal ancillary only  2. Left breast lump I have written a note in the message section of the referral requesting that they do the mammogram on this patient.  Past mammogram images not available as patient does not recall where she had her last mammogram done 7 years ago - MM DIAG BREAST TOMO BILATERAL; Future  3. Need for Tdap vaccination Given  4. Screening for colon cancer Discussed colon cancer screening methods.  Patient agreeable for colonoscopy.  Referral submitted  5. Tobacco dependence Advised to quit.  She is wanting to quit.  I did commended her on how she has cut back.  I encouraged her to call [phone] quit now again and specifically requests for nicotine replacement therapy in the form of the  patches.  Less than 5 minutes spent on counseling  6. Elevated blood-pressure reading without diagnosis of hypertension Repeat blood pressure today is still in the borderline range but improved.  Encouraged her to continue healthy eating habits, low salt intake and try to exercise.    Patient was given the opportunity to ask questions.  Patient verbalized understanding of the plan and was able to repeat key elements of the plan.   Orders Placed This Encounter  Procedures  . MM DIAG BREAST TOMO BILATERAL  . Tdap vaccine greater than or equal to 7yo IM     Requested Prescriptions    No prescriptions requested or ordered in this encounter    Return in about 4 months (around 12/20/2020).  [name], MD, FACP

## 2020-08-22 LAB — CERVICOVAGINAL ANCILLARY ONLY
Bacterial Vaginitis (gardnerella): POSITIVE — AB
Candida Glabrata: NEGATIVE
Candida Vaginitis: NEGATIVE
Chlamydia: NEGATIVE
Comment: NEGATIVE
Comment: NEGATIVE
Comment: NEGATIVE
Comment: NEGATIVE
Comment: NEGATIVE
Comment: NORMAL
Neisseria Gonorrhea: NEGATIVE
Trichomonas: POSITIVE — AB

## 2020-08-23 ENCOUNTER — Other Ambulatory Visit: Payer: Self-pay | Admitting: Internal Medicine

## 2020-08-23 MED ORDER — METRONIDAZOLE 500 MG PO TABS
500.0000 mg | ORAL_TABLET | Freq: Two times a day (BID) | ORAL | 0 refills | Status: DC
Start: 2020-08-23 — End: 2020-08-23

## 2020-08-25 ENCOUNTER — Telehealth: Payer: Self-pay

## 2020-08-25 NOTE — Telephone Encounter (Signed)
Contacted pt to go over lab results pt didn't answer lvm  

## 2020-08-26 ENCOUNTER — Telehealth: Payer: Self-pay

## 2020-08-26 NOTE — Telephone Encounter (Signed)
Contacted pt to go over lab results pt didn't answer lvm  

## 2020-08-29 LAB — CYTOLOGY - PAP
Adequacy: ABSENT
Comment: NEGATIVE
Comment: NEGATIVE
HPV 16: NEGATIVE
HPV 18 / 45: NEGATIVE
High risk HPV: POSITIVE — AB

## 2020-08-30 ENCOUNTER — Other Ambulatory Visit: Payer: Self-pay | Admitting: Internal Medicine

## 2020-08-30 DIAGNOSIS — R87612 Low grade squamous intraepithelial lesion on cytologic smear of cervix (LGSIL): Secondary | ICD-10-CM

## 2020-09-03 ENCOUNTER — Other Ambulatory Visit: Payer: Self-pay | Admitting: Internal Medicine

## 2020-09-03 DIAGNOSIS — E119 Type 2 diabetes mellitus without complications: Secondary | ICD-10-CM

## 2020-09-03 DIAGNOSIS — Z794 Long term (current) use of insulin: Secondary | ICD-10-CM

## 2020-09-03 DIAGNOSIS — E1165 Type 2 diabetes mellitus with hyperglycemia: Secondary | ICD-10-CM

## 2020-09-03 MED ORDER — METFORMIN HCL 1000 MG PO TABS: 1000.0000 mg | ORAL_TABLET | Freq: Two times a day (BID) | ORAL | 2 refills | Status: DC

## 2020-09-03 MED ORDER — NOVOLIN 70/30 FLEXPEN RELION (70-30) 100 UNIT/ML ~~LOC~~ SUPN
5.0000 [IU] | PEN_INJECTOR | Freq: Two times a day (BID) | SUBCUTANEOUS | 3 refills | Status: DC
Start: 2020-09-03 — End: 2020-10-31

## 2020-09-03 MED FILL — METFORMIN HCL 1000 MG TABS: 1000 | 30 days supply | Qty: 60 | Fill #0

## 2020-09-03 MED FILL — metroNIDAZOLE 500 MG TABS: 500 | 7 days supply | Qty: 14 | Fill #0

## 2020-09-03 MED FILL — NOVOLIN 70/30 FLEXPEN (70-3: (70-30) 100 | 30 days supply | Qty: 3 | Fill #0

## 2020-09-03 NOTE — Telephone Encounter (Signed)
Medication: metFORMIN (GLUCOPHAGE) 1000 MG tablet [[id number]] , insulin isophane & regular human (NOVOLIN 70/30 FLEXPEN RELION) (70-30) 100 UNIT/ML KwikPen [[id number]]   Has the patient contacted their pharmacy? YES  (Agent: If no, request that the patient contact the pharmacy for the refill.) (Agent: If yes, when and what did the pharmacy advise?)  Preferred Pharmacy (with phone number or street name): [facility] [address] [place] [address] Phone: [phone] Fax: [phone] Hours: Not open 24 hours    Agent: Please be advised that RX refills may take up to 3 business days. We ask that you follow-up with your pharmacy.

## 2020-09-16 MED FILL — metroNIDAZOLE 500 MG TABS: 500 | 7 days supply | Qty: 14 | Fill #0

## 2020-09-18 ENCOUNTER — Telehealth: Payer: Self-pay | Admitting: Internal Medicine

## 2020-09-18 NOTE — Telephone Encounter (Signed)
Copied from CRM [id number]. Topic: General - Other >> Sep 16, 2020 12:18 PM Dalphine Handing A wrote: Patient is requesting a callback from Dr. Laural Benes in regards to not being able to get her medication from pharmacy.

## 2020-09-18 NOTE — Telephone Encounter (Signed)
Returned pt to get more information regarding rx pt didn't answer lvm asking pt to give a call back at her earliest convenience

## 2020-09-26 ENCOUNTER — Ambulatory Visit
Admission: RE | Admit: 2020-09-26 | Discharge: 2020-09-26 | Disposition: A | Discharge status: Home / Self Care | Payer: 59 | Source: Ambulatory Visit | Attending: Internal Medicine | Admitting: Internal Medicine

## 2020-09-26 ENCOUNTER — Other Ambulatory Visit: Payer: Self-pay

## 2020-09-26 DIAGNOSIS — N6320 Unspecified lump in the left breast, unspecified quadrant: Secondary | ICD-10-CM

## 2020-09-29 ENCOUNTER — Telehealth: Payer: Self-pay

## 2020-09-29 NOTE — Telephone Encounter (Signed)
Contacted pt to go over MM/US results pt didn't answer lvm

## 2020-10-03 ENCOUNTER — Telehealth: Payer: Self-pay

## 2020-10-03 NOTE — Telephone Encounter (Signed)
Summary: Patient called to ask the nurse or doctor to call regarding her medical records  Reason for CRM: Patient called to ask the nurse or doctor to call her regarding getting her medical records sent to [place] for a claim. Please advise and call patient to discuss at [phone]

## 2020-10-06 ENCOUNTER — Telehealth: Payer: Self-pay | Admitting: Internal Medicine

## 2020-10-06 NOTE — Telephone Encounter (Signed)
Patient dropped off Disability forms to be filled out. Patient has appt on 2/1 and asked if forms could be completed by then. Advised patient it takes up to 7 to 14 days for forms to be completed.

## 2020-10-07 NOTE — Telephone Encounter (Signed)
Forms has been placed in pcp folder to look at

## 2020-10-08 NOTE — Telephone Encounter (Signed)
Contacted pt to go over Dr. Wynetta Emery message pt is aware. Pt states she will come fill out a medical release form on Tuesday

## 2020-10-14 ENCOUNTER — Ambulatory Visit: Payer: 59 | Attending: Internal Medicine | Admitting: Internal Medicine

## 2020-10-14 ENCOUNTER — Other Ambulatory Visit: Payer: Self-pay

## 2020-10-14 DIAGNOSIS — Z91199 Patient's noncompliance with other medical treatment and regimen due to unspecified reason: Secondary | ICD-10-CM

## 2020-10-14 DIAGNOSIS — Z5329 Procedure and treatment not carried out because of patient's decision for other reasons: Secondary | ICD-10-CM

## 2020-10-14 NOTE — Progress Notes (Signed)
No show for telephone visit

## 2020-10-31 ENCOUNTER — Ambulatory Visit: Payer: Self-pay | Attending: Internal Medicine | Admitting: Internal Medicine

## 2020-10-31 ENCOUNTER — Other Ambulatory Visit: Payer: Self-pay

## 2020-10-31 ENCOUNTER — Telehealth: Payer: Self-pay

## 2020-10-31 ENCOUNTER — Other Ambulatory Visit: Payer: Self-pay | Admitting: Internal Medicine

## 2020-10-31 DIAGNOSIS — E119 Type 2 diabetes mellitus without complications: Secondary | ICD-10-CM

## 2020-10-31 DIAGNOSIS — L659 Nonscarring hair loss, unspecified: Secondary | ICD-10-CM

## 2020-10-31 MED ORDER — METFORMIN HCL 1000 MG PO TABS: 500.0000 mg | ORAL_TABLET | Freq: Every day | ORAL | 2 refills | Status: DC

## 2020-10-31 MED ORDER — NOVOLIN 70/30 FLEXPEN RELION (70-30) 100 UNIT/ML ~~LOC~~ SUPN: 7.0000 [IU] | PEN_INJECTOR | Freq: Two times a day (BID) | SUBCUTANEOUS | 3 refills | Status: DC

## 2020-10-31 MED FILL — METFORMIN HCL 1000 MG TABS: 1000 | 30 days supply | Qty: 15 | Fill #0

## 2020-10-31 MED FILL — NOVOLIN 70/30 FLEXPEN (70-3: (70-30) 100 | 21 days supply | Qty: 3 | Fill #0

## 2020-10-31 NOTE — Progress Notes (Addendum)
Virtual Visit via Telephone Note  I connected with Tamara Barnes on 10/31/20 at 2:30 p.m by telephone and verified that I am speaking with the correct person using two identifiers.  Location: Patient:  home Provider: Office The patient, my CMA Ms. Tamara Barnes and myself participated in this encounter.   I discussed the limitations, risks, security and privacy concerns of performing an evaluation and management service by telephone and the availability of in person appointments. I also discussed with the patient that there may be a patient responsible charge related to this service. The patient expressed understanding and agreed to proceed.   History of Present Illness: Pt with hx of DM bilateral nonproliferative diabetic retinopathy, tob dep, fibroids, vit D def.  Patient was last seen 08/2020  DM: pt reports very bad diarrhea with the increase dose of Metformin to 1 gram BID.  Does well when she takes it only once a day at the 500 mg dose.  She states that the diarrhea was so bad today she had to take some Pepto-Bismol. BS running 109-130  Reports hair loss with bald spots since last wk. No rash on scalp.  She gets a sharp pain on the scalp.  When she scratches it, a day or 2 lab her hair falls out in that area. No new hair products.  She use to put texturizer in hair but now;  natural since last yr.  Outpatient Encounter Medications as of 10/31/2020  Medication Sig  . insulin isophane & regular human (NOVOLIN 70/30 FLEXPEN RELION) (70-30) 100 UNIT/ML KwikPen Inject 5 Units into the skin 2 (two) times daily with a meal.  . Insulin Pen Needle 32G X 4 MM MISC 1 each by Does not apply route in the morning and at bedtime.  . metFORMIN (GLUCOPHAGE) 1000 MG tablet Take 1 tablet (1,000 mg total) by mouth 2 (two) times daily with a meal.  . metroNIDAZOLE (FLAGYL) 500 MG tablet Take 1 tablet (500 mg total) by mouth 2 (two) times daily.  . Vitamin D, Ergocalciferol, (DRISDOL) 1.25 MG (50000 UNIT) CAPS  capsule Take 1 capsule (50,000 Units total) by mouth every 7 (seven) days. (Patient not taking: Reported on 07/03/2020)   No facility-administered encounter medications on file as of 10/31/2020.      Observations/Objective: No direct observation done as this was a telephone encounter.  Assessment and Plan: 1. Type 2 diabetes mellitus without complication, without long-term current use of insulin (HCC) Recommend decreasing Metformin to 500 mg once a day.  Increase Novolin 70/30 from 5 units twice a day to 7 units twice a day.  Continue to monitor blood sugars.  Advised patient that if blood sugars start running higher she would need to increase the insulin more in 2 unit increments. - metFORMIN (GLUCOPHAGE) 1000 MG tablet; Take 0.5 tablets (500 mg total) by mouth daily with breakfast.  Dispense: 60 tablet; Refill: 2 - insulin isophane & regular human (NOVOLIN 70/30 FLEXPEN RELION) (70-30) 100 UNIT/ML KwikPen; Inject 7 Units into the skin 2 (two) times daily with a meal.  Dispense: 15 mL; Refill: 3 Addendum 12/10/2020:  Pt is checking BS three times a day before meals and should continue doing so. 2. Hair loss - TSH+T4F+T3Free; Future - Ambulatory referral to Dermatology   Follow Up Instructions: As previously scheduled.   I discussed the assessment and treatment plan with the patient. The patient was provided an opportunity to ask questions and all were answered. The patient agreed with the plan and demonstrated an understanding  of the instructions.   The patient was advised to call back or seek an in-person evaluation if the symptoms worsen or if the condition fails to improve as anticipated.  I provided 7 minutes of non-face-to-face time during this encounter.   [name], MD

## 2020-10-31 NOTE — Progress Notes (Signed)
Pt states her pain is coming from the metformin   Pt states the metformin is giving her diarrhea   Pt states she went back to 500mg  today   Pt states her hair is falling out   Pt states her back has been itching all the time

## 2020-10-31 NOTE — Telephone Encounter (Signed)
Patient qualifies for Humulin 70/30 flexpen PASS, Novolin 70/30 is available for PASS in vial form only.  Would it be appropriate to have therapy changed to Humulin 70/30 for PASS?

## 2020-11-01 ENCOUNTER — Other Ambulatory Visit: Payer: Self-pay | Admitting: Internal Medicine

## 2020-11-01 MED ORDER — HUMULIN 70/30 KWIKPEN (70-30) 100 UNIT/ML ~~LOC~~ SUPN: 7.0000 [IU] | PEN_INJECTOR | Freq: Two times a day (BID) | SUBCUTANEOUS | 11 refills | Status: DC

## 2020-11-06 ENCOUNTER — Other Ambulatory Visit: Payer: Self-pay

## 2020-11-06 ENCOUNTER — Ambulatory Visit (AMBULATORY_SURGERY_CENTER): Payer: Self-pay

## 2020-11-06 VITALS — Ht 62.5 in | Wt 131.0 lb

## 2020-11-06 DIAGNOSIS — Z1211 Encounter for screening for malignant neoplasm of colon: Secondary | ICD-10-CM

## 2020-11-06 MED ORDER — NA SULFATE-K SULFATE-MG SULF 17.5-3.13-1.6 GM/177ML PO SOLN: 1.0000 | Freq: Once | ORAL | 0 refills | Status: AC

## 2020-11-06 NOTE — Progress Notes (Signed)
No allergies to soy or egg Pt is not on blood thinners or diet pills Denies issues with sedation/intubation Denies atrial flutter/fib Denies constipation   Emmi instructions given to pt  Pt is aware of Covid safety and care partner requirements.  

## 2020-11-18 ENCOUNTER — Telehealth: Payer: Self-pay | Admitting: Internal Medicine

## 2020-11-18 ENCOUNTER — Other Ambulatory Visit: Payer: Self-pay | Admitting: Internal Medicine

## 2020-11-18 DIAGNOSIS — Z1211 Encounter for screening for malignant neoplasm of colon: Secondary | ICD-10-CM

## 2020-11-18 MED ORDER — SUPREP BOWEL PREP KIT 17.5-3.13-1.6 GM/177ML PO SOLN: 1.0000 | Freq: Once | ORAL | 0 refills | Status: DC

## 2020-11-18 MED FILL — SUPREP BOWEL PREP KIT: 17.5-3.13-1 | 354 days supply | Qty: 354 | Fill #0

## 2020-11-18 NOTE — Telephone Encounter (Signed)
Suprep sent to [facility] as per pt's request

## 2020-11-18 NOTE — Telephone Encounter (Signed)
Patient called and said the pharmacy does not have her prep medication and her procedure is scheduled for 11/20/20.

## 2020-11-19 ENCOUNTER — Telehealth: Payer: Self-pay | Admitting: *Deleted

## 2020-11-19 DIAGNOSIS — Z1211 Encounter for screening for malignant neoplasm of colon: Secondary | ICD-10-CM

## 2020-11-19 MED ORDER — NA SULFATE-K SULFATE-MG SULF 17.5-3.13-1.6 GM/177ML PO SOLN: 1.0000 | Freq: Once | ORAL | 0 refills | Status: AC

## 2020-11-19 NOTE — Telephone Encounter (Signed)
Re-sent Suprep prescription to [facility] at [facility].  Patient is aware.

## 2020-11-19 NOTE — Telephone Encounter (Signed)
Pt is wanting to inform the nurse that the medication needs to go to [facility] [address]

## 2020-11-20 ENCOUNTER — Encounter: Payer: 59 | Admitting: Internal Medicine

## 2020-11-20 NOTE — Telephone Encounter (Signed)
Spoke with pt and rescheduled colonoscopy for 12-04-20 at 10:30 am.  Plenvu sample will be given to pt.  Pt told to try to pick up prep and new instructions in the next few days; understanding voiced

## 2020-11-20 NOTE — Telephone Encounter (Signed)
Ok Maybe we can provide or sample prep, or try Golytely and or MiraLax (as last resort)

## 2020-11-20 NOTE — Telephone Encounter (Signed)
Inbound call from patient this morning wanting to cancel her procedure scheduled for today at 11:30am stating she did not pick up prep medication because it was over $100 which she can not afford at the moment.  IS requesting a call back from a nurse to reschedule procedure and to discuss what other options she has for a more affordable prep.

## 2020-12-02 ENCOUNTER — Telehealth: Payer: Self-pay | Admitting: Internal Medicine

## 2020-12-02 NOTE — Telephone Encounter (Signed)
Copied from [place] [id number]. Topic: General - Other >> Dec 01, 2020  4:27 PM Celene Kras wrote: Reason for CRM: Meda Coffee, from [facility], calling and is requesting to follow up on a fax they sent over on 11/26/20. He states that he has faxed that over again today. Please advise. >> Dec 02, 2020 10:36 AM [place], Brayton Layman A wrote: Dwana Melena called back again to check on this, they said they would re-fax everything again today FYI.

## 2020-12-03 NOTE — Telephone Encounter (Signed)
Hassan Rowan from [facility] called to check if fax from 3.16.22 was received / Hassan Rowan hung up before I could advised that it has been received and placed in providers box pre [place]

## 2020-12-04 ENCOUNTER — Ambulatory Visit (AMBULATORY_SURGERY_CENTER): Payer: 59 | Admitting: Internal Medicine

## 2020-12-04 ENCOUNTER — Other Ambulatory Visit: Payer: Self-pay

## 2020-12-04 ENCOUNTER — Other Ambulatory Visit: Payer: Self-pay | Admitting: Internal Medicine

## 2020-12-04 ENCOUNTER — Encounter: Payer: Self-pay | Admitting: Internal Medicine

## 2020-12-04 VITALS — BP 165/101 | HR 70 | Temp 96.9°F | Resp 17 | Ht 62.5 in | Wt 131.0 lb

## 2020-12-04 DIAGNOSIS — L659 Nonscarring hair loss, unspecified: Secondary | ICD-10-CM

## 2020-12-04 DIAGNOSIS — D125 Benign neoplasm of sigmoid colon: Secondary | ICD-10-CM

## 2020-12-04 DIAGNOSIS — K635 Polyp of colon: Secondary | ICD-10-CM

## 2020-12-04 DIAGNOSIS — Z1211 Encounter for screening for malignant neoplasm of colon: Secondary | ICD-10-CM

## 2020-12-04 MED ORDER — SODIUM CHLORIDE 0.9 % IV SOLN: 500.0000 mL | INTRAVENOUS | Status: DC

## 2020-12-04 NOTE — Progress Notes (Signed)
pt tolerated well. VSS. awake and to recovery. Report given to RN.  

## 2020-12-04 NOTE — Progress Notes (Signed)
Pt's states no medical or surgical changes since previsit or office visit. 

## 2020-12-04 NOTE — Op Note (Signed)
[place] Patient Name: Tamara Barnes Procedure Date: 12/04/2020 11:00 AM MRN: [id number] Endoscopist: Jerene Bears , MD Age: 55 Referring MD:  Date of Birth: May 05, 1966 Gender: Female Account #: [id number] Procedure:                Colonoscopy Indications:              Screening for colorectal malignant neoplasm, This                            is the patient's first colonoscopy Medicines:                Monitored Anesthesia Care Procedure:                Pre-Anesthesia Assessment:                           - Prior to the procedure, a History and Physical                            was performed, and patient medications and                            allergies were reviewed. The patient's tolerance of                            previous anesthesia was also reviewed. The risks                            and benefits of the procedure and the sedation                            options and risks were discussed with the patient.                            All questions were answered, and informed consent                            was obtained. Prior Anticoagulants: The patient has                            taken no previous anticoagulant or antiplatelet                            agents. ASA Grade Assessment: III - A patient with                            severe systemic disease. After reviewing the risks                            and benefits, the patient was deemed in                            satisfactory condition to undergo the procedure.  After obtaining informed consent, the colonoscope                            was passed under direct vision. Throughout the                            procedure, the patient's blood pressure, pulse, and                            oxygen saturations were monitored continuously. The                            Olympus PCF-H190DL (#[id number]) Colonoscope was                            introduced through the  anus and advanced to the                            cecum, identified by appendiceal orifice and                            ileocecal valve. The colonoscopy was performed                            without difficulty. The patient tolerated the                            procedure well. The quality of the bowel                            preparation was adequate after copious irrigation                            and lavage (Suprep was used). The ileocecal valve,                            appendiceal orifice, and rectum were photographed. Scope In: 11:11:56 AM Scope Out: 11:34:23 AM Scope Withdrawal Time: 0 hours 16 minutes 14 seconds  Total Procedure Duration: 0 hours 22 minutes 27 seconds  Findings:                 The digital rectal exam was normal.                           Two sessile polyps were found in the sigmoid colon.                            The polyps were 5 to 6 mm in size. These polyps                            were removed with a cold snare. Resection and                            retrieval were complete.  Internal hemorrhoids were found during                            retroflexion. The hemorrhoids were small-sized. Complications:            No immediate complications. Estimated Blood Loss:     Estimated blood loss was minimal. Impression:               - Two 5 to 6 mm polyps in the sigmoid colon,                            removed with a cold snare. Resected and retrieved.                           - Small internal hemorrhoids. Recommendation:           - Patient has a contact number available for                            emergencies. The signs and symptoms of potential                            delayed complications were discussed with the                            patient. Return to normal activities tomorrow.                            Written discharge instructions were provided to the                            patient.                            - Resume previous diet.                           - Continue present medications.                           - Await pathology results.                           - Repeat colonoscopy is recommended. The                            colonoscopy date will be determined after pathology                            results from today's exam become available for                            review. Jerene Bears, MD 12/04/2020 11:37:54 AM This report has been signed electronically.

## 2020-12-04 NOTE — Progress Notes (Signed)
Called to room to assist during endoscopic procedure.  Patient ID and intended procedure confirmed with present staff. Received instructions for my participation in the procedure from the performing physician.  

## 2020-12-04 NOTE — Telephone Encounter (Signed)
Received forms back from provider will fax in the morning

## 2020-12-04 NOTE — Patient Instructions (Signed)
YOU HAD AN ENDOSCOPIC PROCEDURE TODAY AT THE [facility]:   Refer to the procedure report that was given to you for any specific questions about what was found during the examination.  If the procedure report does not answer your questions, please call your gastroenterologist to clarify.  If you requested that your care partner not be given the details of your procedure findings, then the procedure report has been included in a sealed envelope for you to review at your convenience later.  YOU SHOULD EXPECT: Some feelings of bloating in the abdomen. Passage of more gas than usual.  Walking can help get rid of the air that was put into your GI tract during the procedure and reduce the bloating. If you had a lower endoscopy (such as a colonoscopy or flexible sigmoidoscopy) you may notice spotting of blood in your stool or on the toilet paper. If you underwent a bowel prep for your procedure, you may not have a normal bowel movement for a few days.  Please Note:  You might notice some irritation and congestion in your nose or some drainage.  This is from the oxygen used during your procedure.  There is no need for concern and it should clear up in a day or so.  SYMPTOMS TO REPORT IMMEDIATELY:   Following lower endoscopy (colonoscopy or flexible sigmoidoscopy):  Excessive amounts of blood in the stool  Significant tenderness or worsening of abdominal pains  Swelling of the abdomen that is new, acute  Fever of 100F or higher   Following upper endoscopy (EGD)  Vomiting of blood or coffee ground material  New chest pain or pain under the shoulder blades  Painful or persistently difficult swallowing  New shortness of breath  Fever of 100F or higher  Black, tarry-looking stools  For urgent or emergent issues, a gastroenterologist can be reached at any hour by calling [phone]. Do not use MyChart messaging for urgent concerns.    DIET:  We do recommend a small meal at first, but  then you may proceed to your regular diet.  Drink plenty of fluids but you should avoid alcoholic beverages for 24 hours.  ACTIVITY:  You should plan to take it easy for the rest of today and you should NOT DRIVE or use heavy machinery until tomorrow (because of the sedation medicines used during the test).    FOLLOW UP: Our staff will call the number listed on your records 48-72 hours following your procedure to check on you and address any questions or concerns that you may have regarding the information given to you following your procedure. If we do not reach you, we will leave a message.  We will attempt to reach you two times.  During this call, we will ask if you have developed any symptoms of COVID 19. If you develop any symptoms (ie: fever, flu-like symptoms, shortness of breath, cough etc.) before then, please call [phone].  If you test positive for Covid 19 in the 2 weeks post procedure, please call and report this information to us.    If any biopsies were taken you will be contacted by phone or by letter within the next 1-3 weeks.  Please call us at [phone] if you have not heard about the biopsies in 3 weeks.    SIGNATURES/CONFIDENTIALITY: You and/or your care partner have signed paperwork which will be entered into your electronic medical record.  These signatures attest to the fact that that the information above on   your After Visit Summary has been reviewed and is understood.  Full responsibility of the confidentiality of this discharge information lies with you and/or your care-partner. 

## 2020-12-04 NOTE — Telephone Encounter (Signed)
Tamara Barnes, from [place], calling again regarding verifying whether this has been received or not. Attempted to contact office 3x. Please advise.

## 2020-12-05 LAB — TSH+T4F+T3FREE
Free T4: 1.1 ng/dL (ref 0.82–1.77)
T3, Free: 3 pg/mL (ref 2.0–4.4)
TSH: 1.1 u[IU]/mL (ref 0.450–4.500)

## 2020-12-05 NOTE — Telephone Encounter (Signed)
Paperwork has been faxed this morning

## 2020-12-08 ENCOUNTER — Telehealth: Payer: Self-pay

## 2020-12-08 ENCOUNTER — Telehealth: Payer: Self-pay | Admitting: *Deleted

## 2020-12-08 NOTE — Telephone Encounter (Signed)
Medical Assistant left message on patient's home and cell voicemail. Voicemail states to give a call back to [place] with [facility] at [phone]. Patient is aware via VM that the thyroid is normal.

## 2020-12-08 NOTE — Telephone Encounter (Signed)
No answer busy signal for post procedure follow up call.

## 2020-12-08 NOTE — Telephone Encounter (Signed)
First attempt follow up call to pt, lm on vm 

## 2020-12-08 NOTE — Telephone Encounter (Signed)
-----   Message from Ladell Pier, MD sent at 12/06/2020  5:17 PM EDT ----- Thyroid level is normal.

## 2020-12-09 NOTE — Telephone Encounter (Signed)
Mickel Baas, from [facility], calling stating that they received the forms, but that they are needing to have something that states that the pt is testing/ injecting blood sugar 3 times a day. They state that this would need to be included in the office visit notes. Please advise.    [phone]

## 2020-12-10 NOTE — Telephone Encounter (Signed)
Will forward to provider see if she is able to addend her note

## 2020-12-10 NOTE — Telephone Encounter (Signed)
Refaxed ov note

## 2020-12-15 ENCOUNTER — Encounter (INDEPENDENT_AMBULATORY_CARE_PROVIDER_SITE_OTHER): Payer: 59 | Admitting: Ophthalmology

## 2020-12-18 ENCOUNTER — Encounter: Payer: Self-pay | Admitting: Internal Medicine

## 2021-02-18 ENCOUNTER — Ambulatory Visit: Payer: 59 | Admitting: Physician Assistant

## 2021-04-16 ENCOUNTER — Ambulatory Visit: Payer: 59 | Admitting: Internal Medicine

## 2021-05-13 ENCOUNTER — Ambulatory Visit: Payer: 59 | Attending: Physician Assistant | Admitting: Physician Assistant

## 2021-05-13 ENCOUNTER — Other Ambulatory Visit: Payer: Self-pay

## 2021-05-13 ENCOUNTER — Encounter: Payer: Self-pay | Admitting: Physician Assistant

## 2021-05-13 ENCOUNTER — Other Ambulatory Visit: Payer: Self-pay | Admitting: Physician Assistant

## 2021-05-13 VITALS — BP 175/97 | HR 76 | Ht 62.5 in | Wt 136.0 lb

## 2021-05-13 DIAGNOSIS — I10 Essential (primary) hypertension: Secondary | ICD-10-CM

## 2021-05-13 DIAGNOSIS — E119 Type 2 diabetes mellitus without complications: Secondary | ICD-10-CM | POA: Diagnosis not present

## 2021-05-13 DIAGNOSIS — F17200 Nicotine dependence, unspecified, uncomplicated: Secondary | ICD-10-CM

## 2021-05-13 DIAGNOSIS — E1165 Type 2 diabetes mellitus with hyperglycemia: Secondary | ICD-10-CM

## 2021-05-13 DIAGNOSIS — G629 Polyneuropathy, unspecified: Secondary | ICD-10-CM

## 2021-05-13 DIAGNOSIS — K5900 Constipation, unspecified: Secondary | ICD-10-CM | POA: Diagnosis not present

## 2021-05-13 LAB — POCT GLYCOSYLATED HEMOGLOBIN (HGB A1C): HbA1c, POC (controlled diabetic range): 7.7 % — AB (ref 0.0–7.0)

## 2021-05-13 LAB — GLUCOSE, POCT (MANUAL RESULT ENTRY): POC Glucose: 171 mg/dl — AB (ref 70–99)

## 2021-05-13 MED ORDER — HYDROCORTISONE ACETATE 25 MG RE SUPP: 25.0000 mg | Freq: Two times a day (BID) | RECTAL | 0 refills | Status: DC

## 2021-05-13 MED ORDER — INSULIN PEN NEEDLE 32G X 4 MM MISC: 1.0000 | Freq: Two times a day (BID) | 3 refills | Status: DC

## 2021-05-13 MED ORDER — METFORMIN HCL 1000 MG PO TABS
500.0000 mg | ORAL_TABLET | Freq: Two times a day (BID) | ORAL | 2 refills | Status: DC
  Filled 2021-05-13: qty 30, 30d supply, fill #0

## 2021-05-13 MED ORDER — METFORMIN HCL 1000 MG PO TABS: 500.0000 mg | ORAL_TABLET | Freq: Two times a day (BID) | ORAL | 2 refills | Status: DC

## 2021-05-13 MED ORDER — GABAPENTIN 300 MG PO CAPS
300.0000 mg | ORAL_CAPSULE | Freq: Every day | ORAL | 3 refills | Status: DC
  Filled 2021-05-13: qty 30, 30d supply, fill #0

## 2021-05-13 MED ORDER — INSULIN ISOPHANE & REGULAR (HUMAN 70-30)100 UNIT/ML KWIKPEN
16.0000 [IU] | PEN_INJECTOR | Freq: Two times a day (BID) | SUBCUTANEOUS | 11 refills | Status: DC
  Filled 2021-05-13: qty 9, 28d supply, fill #0

## 2021-05-13 MED ORDER — HYDROCORTISONE ACETATE 25 MG RE SUPP
25.0000 mg | Freq: Two times a day (BID) | RECTAL | 0 refills | Status: DC
  Filled 2021-05-13: qty 12, 6d supply, fill #0

## 2021-05-13 MED ORDER — GABAPENTIN 300 MG PO CAPS: 300.0000 mg | ORAL_CAPSULE | Freq: Every day | ORAL | 3 refills | Status: DC

## 2021-05-13 MED ORDER — LISINOPRIL 10 MG PO TABS
10.0000 mg | ORAL_TABLET | Freq: Every day | ORAL | 3 refills | Status: DC
  Filled 2021-05-13: qty 30, 30d supply, fill #0

## 2021-05-13 MED ORDER — INSULIN ISOPHANE & REGULAR (HUMAN 70-30)100 UNIT/ML KWIKPEN: 16.0000 [IU] | PEN_INJECTOR | Freq: Two times a day (BID) | SUBCUTANEOUS | 11 refills | Status: DC

## 2021-05-13 MED ORDER — LISINOPRIL 10 MG PO TABS: 10.0000 mg | ORAL_TABLET | Freq: Every day | ORAL | 3 refills | Status: DC

## 2021-05-13 NOTE — Progress Notes (Signed)
Tamara Barnes, is a 55 y.o. female  OYD:[id number]  EHM:[id number]  DOB - 1966-03-27  Chief Complaint  Patient presents with   Diabetes       Subjective:   Tamara Barnes is a 55 y.o. female here today for a follow up visit for diabetes.  She says her blood sugars have been running under 200.  She is having neuropathic leg pain.  She had a colonoscopy not long ago and has been having constipation and burning with defecation since.  No blood in stool.    Never been on BP meds.  Denies CP/SOB/CP/dizziness.   No problems updated.  ALLERGIES: Allergies  Allergen Reactions   Oatmeal Shortness Of Breath and Other (See Comments)    Tongue swells   Rice Shortness Of Breath, Swelling and Other (See Comments)    Tongue swells   Wheat Bran Shortness Of Breath, Swelling and Other (See Comments)    Tongue swells    PAST MEDICAL HISTORY: Past Medical History:  Diagnosis Date   Diabetes mellitus without complication ([place])    Hypertension     MEDICATIONS AT HOME: Prior to Admission medications   Medication Sig Start Date End Date Taking? Authorizing Provider  gabapentin (NEURONTIN) 300 MG capsule Take 1 capsule (300 mg total) by mouth at bedtime. 05/13/21  Yes Saleemah Mollenhauer M, PA-C  hydrocortisone (ANUSOL-HC) 25 MG suppository Place 1 suppository (25 mg total) rectally 2 (two) times daily. 05/13/21  Yes Freeman Caldron M, PA-C  Insulin Pen Needle 32G X 4 MM MISC 1 each by Does not apply route in the morning and at bedtime. 05/01/20  Yes Freeman Caldron M, PA-C  lisinopril (ZESTRIL) 10 MG tablet Take 1 tablet (10 mg total) by mouth daily. 05/13/21  Yes Surabhi Gadea M, PA-C  Na Sulfate-K Sulfate-Mg Sulf 17.5-3.13-1.6 GM/177ML SOLN TAKE 1 KIT BY MOUTH ONCE FOR 1 DOSE. SUPREP AS DIRECTED. NO SUBSTITUTIONS 11/18/20 11/18/21 Yes Pyrtle, Lajuan Lines, MD  Vitamin D, Ergocalciferol, (DRISDOL) 1.25 MG (50000 UNIT) CAPS capsule Take 1 capsule (50,000 Units total) by mouth every 7 (seven) days.  05/05/20  Yes Jamair Cato M, PA-C  insulin isophane & regular human KwikPen (HUMULIN 70/30 MIX) (70-30) 100 UNIT/ML KwikPen Inject 16 Units into the skin 2 (two) times daily. With food 05/13/21 05/13/22  Argentina Donovan, PA-C  metFORMIN (GLUCOPHAGE) 1000 MG tablet Take 0.5 tablets (500 mg total) by mouth 2 (two) times daily with a meal. 05/13/21 05/13/22  Lavontae Cornia, Dionne Bucy, PA-C    ROS: Neg HEENT Neg resp Neg cardiac Neg GI Neg GU Neg psych Neg neuro  Objective:   Vitals:   05/13/21 0902  BP: (!) 175/97  Pulse: 76  SpO2: 99%  Weight: 136 lb (61.7 kg)  Height: 5' 2.5" (1.588 m)   Exam General appearance : Awake, alert, not in any distress. Speech Clear. Not toxic looking HEENT: Atraumatic and Normocephalic Neck: Supple, no JVD. No cervical lymphadenopathy.  Chest: Good air entry bilaterally, CTAB.  No rales/rhonchi/wheezing CVS: S1 S2 regular, no murmurs.  Extremities: B/L Lower Ext shows no edema, both legs are warm to touch Neurology: Awake alert, and oriented X 3, CN II-XII intact, Non focal Skin: No Rash  Data Review Lab Results  Component Value Date   HGBA1C 7.7 (A) 05/13/2021   HGBA1C 11.0 (H) 04/07/2020    Assessment & Plan   1. Type 2 diabetes mellitus with hyperglycemia, unspecified whether long term insulin use (HCC) Improved control - Glucose (CBG) - HgB A1c -  Comprehensive metabolic panel - Lipid panel - Vitamin D, 25-hydroxy  2. Type 2 diabetes mellitus without complication, without long-term current use of insulin (HCC) Improving control Increase metformin and increase insulin to 16 units bid with food(from 14 units bid) - metFORMIN (GLUCOPHAGE) 1000 MG tablet; Take 0.5 tablets (500 mg total) by mouth 2 (two) times daily with a meal.  Dispense: 60 tablet; Refill: 2  3. Constipation, unspecified constipation type - CBC with Differential/Platelet - hydrocortisone (ANUSOL-HC) 25 MG suppository; Place 1 suppository (25 mg total) rectally 2 (two)  times daily.  Dispense: 12 suppository; Refill: 0  4. Hypertension, unspecified type Start- - lisinopril (ZESTRIL) 10 MG tablet; Take 1 tablet (10 mg total) by mouth daily.  Dispense: 90 tablet; Refill: 3 We have discussed target BP range and blood pressure goal. I have advised patient to check BP regularly and to call us back or report to clinic if the numbers are consistently higher than 140/90. We discussed the importance of compliance with medical therapy and DASH diet recommended, consequences of uncontrolled hypertension discussed.    5. Neuropathy - gabapentin (NEURONTIN) 300 MG capsule; Take 1 capsule (300 mg total) by mouth at bedtime.  Dispense: 90 capsule; Refill: 3 - Thyroid Panel With TSH - Vitamin D, 25-hydroxy  6. Tobacco dependence Smoking and dangers of nicotine have been discussed at length. Long term health consequences of smoking reviewed in detail.  Methods for helping with cessation have been reviewed.  Patient expresses understanding.  She received some materials from 1-800-quit but did not understand them.  I have advised her to bring these to her next appt.    Patient have been counseled extensively about nutrition and exercise. Other issues discussed during this visit include: low cholesterol diet, weight control and daily exercise, foot care, annual eye examinations at Ophthalmology, importance of adherence with medications and regular follow-up. We also discussed long term complications of uncontrolled diabetes and hypertension.   Return for 3 weeks with luke for smoking/DM/htn;  3 months with PCP.  The patient was given clear instructions to go to ER or return to medical center if symptoms don't improve, worsen or new problems develop. The patient verbalized understanding. The patient was told to call to get lab results if they haven't heard anything in the next week.      Freeman Caldron, PA-C [facility] [place],  [place]   05/13/2021, 9:33 AM Patient ID: Tamara Barnes, female   DOB: 01-Oct-1965, 55 y.o.   MRN: [id number]

## 2021-05-13 NOTE — Telephone Encounter (Signed)
Ordered Humulin  70/30 Kwikpen today. Please see pharmacy request to change to Novolin 70/30 due to cost.   Requested Prescriptions  Pending Prescriptions Disp Refills   HUMULIN 70/30 KWIKPEN (70-30) 100 UNIT/ML KwikPen [Pharmacy Med Name: HUMULIN  70/30  KWIKPEN   INJ] 15 mL 11    Sig: INJECT [place]     Endocrinology:  Diabetes - Insulins Passed - 05/13/2021 10:45 AM      Passed - HBA1C is between 0 and 7.9 and within 180 days    HbA1c, POC (controlled diabetic range)  Date Value Ref Range Status  05/13/2021 7.7 (A) 0.0 - 7.0 % Final          Passed - Valid encounter within last 6 months    Recent Outpatient Visits           Today Type 2 diabetes mellitus with hyperglycemia, unspecified whether long term insulin use [facility])   [place], [place], [place]   6 months ago Type 2 diabetes mellitus without complication, without long-term current use of insulin ([place])   [place], [place]   7 months ago No-show for appointment   [place], MD   8 months ago Need for Tdap vaccination   [place] [name] B, MD   10 months ago Type 2 diabetes mellitus with hyperglycemia, with long-term current use of insulin [facility])   [place], Deborah B, MD       Future Appointments             In 3 weeks Daisy Blossom, Jarome [place]atin, [place]   In 3 months Wynetta Emery, Dalbert Batman, MD [place]

## 2021-05-13 NOTE — Progress Notes (Signed)
Pt stated since her colonoscopy she has been constipated.

## 2021-05-13 NOTE — Patient Instructions (Signed)
Check blood sugars fasting and bedtime.   Bring nicotine cessation products Hypertension, Adult Hypertension is another name for high blood pressure. High blood pressure forces your heart to work harder to pump blood. This can cause problems over time. There are two numbers in a blood pressure reading. There is a top number (systolic) over a bottom number (diastolic). It is best to have a blood pressure that is below 120/80. Healthy choices can help lower your blood pressure, or you may need medicine to help lower it. What are the causes? The cause of this condition is not known. Some conditions may be related to high blood pressure. What increases the risk? Smoking. Having type 2 diabetes mellitus, high cholesterol, or both. Not getting enough exercise or physical activity. Being overweight. Having too much fat, sugar, calories, or salt (sodium) in your diet. Drinking too much alcohol. Having long-term (chronic) kidney disease. Having a family history of high blood pressure. Age. Risk increases with age. Race. You may be at higher risk if you are African American. Gender. Men are at higher risk than women before age 3. After age 68, women are at higher risk than men. Having obstructive sleep apnea. Stress. What are the signs or symptoms? High blood pressure may not cause symptoms. Very high blood pressure (hypertensive crisis) may cause: Headache. Feelings of worry or nervousness (anxiety). Shortness of breath. Nosebleed. A feeling of being sick to your stomach (nausea). Throwing up (vomiting). Changes in how you see. Very bad chest pain. Seizures. How is this treated? This condition is treated by making healthy lifestyle changes, such as: Eating healthy foods. Exercising more. Drinking less alcohol. Your health care provider may prescribe medicine if lifestyle changes are not enough to get your blood pressure under control, and if: Your top number is above 130. Your bottom  number is above 80. Your personal target blood pressure may vary. Follow these instructions at home: Eating and drinking  If told, follow the DASH eating plan. To follow this plan: Fill one half of your plate at each meal with fruits and vegetables. Fill one fourth of your plate at each meal with whole grains. Whole grains include whole-wheat pasta, brown rice, and whole-grain bread. Eat or drink low-fat dairy products, such as skim milk or low-fat yogurt. Fill one fourth of your plate at each meal with low-fat (lean) proteins. Low-fat proteins include fish, chicken without skin, eggs, beans, and tofu. Avoid fatty meat, cured and processed meat, or chicken with skin. Avoid pre-made or processed food. Eat less than 1,500 mg of salt each day. Do not drink alcohol if: Your doctor tells you not to drink. You are pregnant, may be pregnant, or are planning to become pregnant. If you drink alcohol: Limit how much you use to: 0-1 drink a day for women. 0-2 drinks a day for men. Be aware of how much alcohol is in your drink. In the [place] one drink equals one 12 oz bottle of beer (355 mL), one 5 oz glass of wine (148 mL), or one 1 oz glass of hard liquor (44 mL). Lifestyle  Work with your doctor to stay at a healthy weight or to lose weight. Ask your doctor what the best weight is for you. Get at least 30 minutes of exercise most days of the week. This may include walking, swimming, or biking. Get at least 30 minutes of exercise that strengthens your muscles (resistance exercise) at least 3 days a week. This may include lifting weights or doing  Pilates. Do not use any products that contain nicotine or tobacco, such as cigarettes, e-cigarettes, and chewing tobacco. If you need help quitting, ask your doctor. Check your blood pressure at home as told by your doctor. Keep all follow-up visits as told by your doctor. This is important. Medicines Take over-the-counter and prescription medicines only  as told by your doctor. Follow directions carefully. Do not skip doses of blood pressure medicine. The medicine does not work as well if you skip doses. Skipping doses also puts you at risk for problems. Ask your doctor about side effects or reactions to medicines that you should watch for. Contact a doctor if you: Think you are having a reaction to the medicine you are taking. Have headaches that keep coming back (recurring). Feel dizzy. Have swelling in your ankles. Have trouble with your vision. Get help right away if you: Get a very bad headache. Start to feel mixed up (confused). Feel weak or numb. Feel faint. Have very bad pain in your: Chest. Belly (abdomen). Throw up more than once. Have trouble breathing. Summary Hypertension is another name for high blood pressure. High blood pressure forces your heart to work harder to pump blood. For most people, a normal blood pressure is less than 120/80. Making healthy choices can help lower blood pressure. If your blood pressure does not get lower with healthy choices, you may need to take medicine. This information is not intended to replace advice given to you by your health care provider. Make sure you discuss any questions you have with your health care provider. Document Revised: 05/10/2018 Document Reviewed: 05/10/2018 Elsevier Patient Education  [place].

## 2021-05-14 ENCOUNTER — Other Ambulatory Visit: Payer: Self-pay

## 2021-05-14 ENCOUNTER — Other Ambulatory Visit: Payer: Self-pay | Admitting: Physician Assistant

## 2021-05-14 LAB — CBC WITH DIFFERENTIAL/PLATELET
Basophils Absolute: 0 10*3/uL (ref 0.0–0.2)
Basos: 0 %
EOS (ABSOLUTE): 0 10*3/uL (ref 0.0–0.4)
Eos: 1 %
Hematocrit: 45.6 % (ref 34.0–46.6)
Hemoglobin: 15 g/dL (ref 11.1–15.9)
Immature Grans (Abs): 0 10*3/uL (ref 0.0–0.1)
Immature Granulocytes: 0 %
Lymphocytes Absolute: 1.8 10*3/uL (ref 0.7–3.1)
Lymphs: 36 %
MCH: 29.5 pg (ref 26.6–33.0)
MCHC: 32.9 g/dL (ref 31.5–35.7)
MCV: 90 fL (ref 79–97)
Monocytes Absolute: 0.3 10*3/uL (ref 0.1–0.9)
Monocytes: 7 %
Neutrophils Absolute: 2.7 10*3/uL (ref 1.4–7.0)
Neutrophils: 56 %
Platelets: 168 10*3/uL (ref 150–450)
RBC: 5.08 x10E6/uL (ref 3.77–5.28)
RDW: 12.6 % (ref 11.7–15.4)
WBC: 4.9 10*3/uL (ref 3.4–10.8)

## 2021-05-14 LAB — THYROID PANEL WITH TSH
Free Thyroxine Index: 1.6 (ref 1.2–4.9)
T3 Uptake Ratio: 32 % (ref 24–39)
T4, Total: 5.1 ug/dL (ref 4.5–12.0)
TSH: 2.5 u[IU]/mL (ref 0.450–4.500)

## 2021-05-14 LAB — COMPREHENSIVE METABOLIC PANEL
ALT: 8 IU/L (ref 0–32)
AST: 11 IU/L (ref 0–40)
Albumin/Globulin Ratio: 1.8 (ref 1.2–2.2)
Albumin: 4.6 g/dL (ref 3.8–4.9)
Alkaline Phosphatase: 109 IU/L (ref 44–121)
BUN/Creatinine Ratio: 14 (ref 9–23)
BUN: 12 mg/dL (ref 6–24)
Bilirubin Total: <0.2 mg/dL (ref 0.0–1.2)
CO2: 21 mmol/L (ref 20–29)
Calcium: 9.6 mg/dL (ref 8.7–10.2)
Chloride: 104 mmol/L (ref 96–106)
Creatinine, Ser: 0.83 mg/dL (ref 0.57–1.00)
Globulin, Total: 2.5 g/dL (ref 1.5–4.5)
Glucose: 168 mg/dL — ABNORMAL HIGH (ref 65–99)
Potassium: 4.4 mmol/L (ref 3.5–5.2)
Sodium: 140 mmol/L (ref 134–144)
Total Protein: 7.1 g/dL (ref 6.0–8.5)
eGFR: 83 mL/min/{1.73_m2} (ref >59)

## 2021-05-14 LAB — LIPID PANEL
Chol/HDL Ratio: 2 ratio (ref 0.0–4.4)
Cholesterol, Total: 174 mg/dL (ref 100–199)
HDL: 85 mg/dL (ref >39)
LDL Chol Calc (NIH): 66 mg/dL (ref 0–99)
Triglycerides: 139 mg/dL (ref 0–149)
VLDL Cholesterol Cal: 23 mg/dL (ref 5–40)

## 2021-05-14 LAB — VITAMIN D 25 HYDROXY (VIT D DEFICIENCY, FRACTURES): Vit D, 25-Hydroxy: 14.9 ng/mL — ABNORMAL LOW (ref 30.0–100.0)

## 2021-05-14 MED ORDER — VITAMIN D (ERGOCALCIFEROL) 1.25 MG (50000 UNIT) PO CAPS
50000.0000 [IU] | ORAL_CAPSULE | ORAL | 0 refills | Status: DC
  Filled 2021-05-14: qty 12, 84d supply, fill #0

## 2021-05-19 ENCOUNTER — Other Ambulatory Visit: Payer: Self-pay

## 2021-05-19 MED ORDER — INSULIN ISOPHANE & REGULAR (HUMAN 70-30)100 UNIT/ML KWIKPEN
PEN_INJECTOR | SUBCUTANEOUS | 0 refills | Status: DC
  Filled 2021-05-19: qty 15, 46d supply, fill #0

## 2021-05-21 ENCOUNTER — Other Ambulatory Visit: Payer: Self-pay

## 2021-05-25 ENCOUNTER — Emergency Department (HOSPITAL_COMMUNITY): Payer: 59

## 2021-05-25 ENCOUNTER — Encounter (HOSPITAL_COMMUNITY): Payer: Self-pay | Admitting: Emergency Medicine

## 2021-05-25 ENCOUNTER — Other Ambulatory Visit: Payer: Self-pay

## 2021-05-25 ENCOUNTER — Emergency Department (HOSPITAL_COMMUNITY)
Admission: EM | Admit: 2021-05-25 | Discharge: 2021-05-25 | Disposition: A | Discharge status: Home / Self Care | Payer: 59 | Attending: Emergency Medicine | Admitting: Emergency Medicine

## 2021-05-25 DIAGNOSIS — M79641 Pain in right hand: Secondary | ICD-10-CM | POA: Insufficient documentation

## 2021-05-25 DIAGNOSIS — S0990XA Unspecified injury of head, initial encounter: Secondary | ICD-10-CM | POA: Insufficient documentation

## 2021-05-25 DIAGNOSIS — M25511 Pain in right shoulder: Secondary | ICD-10-CM | POA: Insufficient documentation

## 2021-05-25 DIAGNOSIS — W109XXA Fall (on) (from) unspecified stairs and steps, initial encounter: Secondary | ICD-10-CM | POA: Diagnosis not present

## 2021-05-25 DIAGNOSIS — M542 Cervicalgia: Secondary | ICD-10-CM | POA: Diagnosis not present

## 2021-05-25 DIAGNOSIS — Z5321 Procedure and treatment not carried out due to patient leaving prior to being seen by health care provider: Secondary | ICD-10-CM | POA: Insufficient documentation

## 2021-05-25 MED ORDER — OXYCODONE-ACETAMINOPHEN 5-325 MG PO TABS
1.0000 | ORAL_TABLET | Freq: Once | ORAL | Status: AC
  Administered 2021-05-25: 1 via ORAL
  Filled 2021-05-25: qty 1

## 2021-05-25 NOTE — ED Provider Notes (Signed)
MSE was initiated and I personally evaluated the patient and placed orders (if any) at  12:34 AM on May 25, 2021.  Patient to ED after fall injuring right UE. She has pain in the shoulder, elbow, wrist and hand. Reports neck pain (right lateral) with movement. No LOC, vomiting, chest pain, abdominal pain. Reports right knee abrasion - has been ambulatory.  Today's Vitals   05/25/21 0024  BP: 140/76  Pulse: 90  Resp: 18  Temp: 98.9 F (37.2 C)  TempSrc: Oral  SpO2: 99%   There is no height or weight on file to calculate BMI.  Right wrist and hand swollen. No bony deformities. No midline cervical tenderness.  Normal sensation Vascularly intact  The patient appears stable so that the remainder of the MSE may be completed by another provider.   Charlann Lange, PA-C 05/25/21 Marya Landry, April, MD 05/25/21 [id number]

## 2021-05-25 NOTE — ED Triage Notes (Signed)
Pt BIB [facility] from home, fell down 8-10 steps, hit head, pt unsure if she passed out. C/o right shoulder pain, right hand pain and right lateral neck pain. A&Ox4.

## 2021-05-27 ENCOUNTER — Emergency Department (HOSPITAL_BASED_OUTPATIENT_CLINIC_OR_DEPARTMENT_OTHER)
Admission: EM | Admit: 2021-05-27 | Discharge: 2021-05-27 | Disposition: A | Discharge status: Home / Self Care | Payer: 59 | Attending: Emergency Medicine | Admitting: Emergency Medicine

## 2021-05-27 ENCOUNTER — Encounter (HOSPITAL_BASED_OUTPATIENT_CLINIC_OR_DEPARTMENT_OTHER): Payer: Self-pay | Admitting: Obstetrics and Gynecology

## 2021-05-27 ENCOUNTER — Encounter: Payer: Self-pay | Admitting: *Deleted

## 2021-05-27 ENCOUNTER — Other Ambulatory Visit: Payer: Self-pay

## 2021-05-27 DIAGNOSIS — Z79899 Other long term (current) drug therapy: Secondary | ICD-10-CM | POA: Diagnosis not present

## 2021-05-27 DIAGNOSIS — S62316A Displaced fracture of base of fifth metacarpal bone, right hand, initial encounter for closed fracture: Secondary | ICD-10-CM | POA: Diagnosis not present

## 2021-05-27 DIAGNOSIS — F17210 Nicotine dependence, cigarettes, uncomplicated: Secondary | ICD-10-CM | POA: Diagnosis not present

## 2021-05-27 DIAGNOSIS — S6991XA Unspecified injury of right wrist, hand and finger(s), initial encounter: Secondary | ICD-10-CM | POA: Diagnosis present

## 2021-05-27 DIAGNOSIS — Z794 Long term (current) use of insulin: Secondary | ICD-10-CM | POA: Diagnosis not present

## 2021-05-27 DIAGNOSIS — M542 Cervicalgia: Secondary | ICD-10-CM | POA: Insufficient documentation

## 2021-05-27 DIAGNOSIS — M79601 Pain in right arm: Secondary | ICD-10-CM

## 2021-05-27 DIAGNOSIS — E119 Type 2 diabetes mellitus without complications: Secondary | ICD-10-CM | POA: Insufficient documentation

## 2021-05-27 DIAGNOSIS — Z7984 Long term (current) use of oral hypoglycemic drugs: Secondary | ICD-10-CM | POA: Insufficient documentation

## 2021-05-27 DIAGNOSIS — W19XXXA Unspecified fall, initial encounter: Secondary | ICD-10-CM

## 2021-05-27 DIAGNOSIS — I10 Essential (primary) hypertension: Secondary | ICD-10-CM | POA: Insufficient documentation

## 2021-05-27 DIAGNOSIS — R519 Headache, unspecified: Secondary | ICD-10-CM | POA: Insufficient documentation

## 2021-05-27 DIAGNOSIS — W108XXA Fall (on) (from) other stairs and steps, initial encounter: Secondary | ICD-10-CM | POA: Diagnosis not present

## 2021-05-27 MED ORDER — HYDROCODONE-ACETAMINOPHEN 5-325 MG PO TABS
1.0000 | ORAL_TABLET | Freq: Once | ORAL | Status: AC
  Administered 2021-05-27: 1 via ORAL
  Filled 2021-05-27: qty 1

## 2021-05-27 MED ORDER — HYDROCODONE-ACETAMINOPHEN 5-325 MG PO TABS: 1.0000 | ORAL_TABLET | Freq: Four times a day (QID) | ORAL | 0 refills | Status: DC | PRN

## 2021-05-27 NOTE — Discharge Instructions (Addendum)
Please take Naproxen (aleve) and Tylenol (acetaminophen) to relieve your pain.  You may take two pills of aleve every 12 hours.  After 2-3 days please try to only take one pill every 12 hours.  In between doses of aleve you may take tylenol, up to 1,000 mg (two extra strength pills).  Do not take more than 3,000 mg tylenol in a 24 hour period.  Please check all medication labels as many medications such as pain and cold medications may contain tylenol.  Your prescription pain medication contains Tylenol.  Each pill has 325 mg of Tylenol which you need to account for when dosing your medications.  Do not drink alcohol while taking these medications.  Do not take other NSAID'S while taking aleve (naproxen) (such as motrin, ibuprofen, voltaren or advil).  Please take aleve (naproxen) with food to decrease stomach upset.  If the aleve irritates your stomach you may stop and get voltaren gel.  This is over the counter and follow directions on the box.   You are being prescribed a medication which may make you sleepy. For 24 hours after one dose please do not drive, operate heavy machinery, care for a small child with out another adult present, or perform any activities that may cause harm to you or someone else if you were to fall asleep or be impaired.

## 2021-05-27 NOTE — ED Triage Notes (Signed)
Patient reports to the ER after falling on the 12th and hitting her head and her right arm. Patient states she slept all day yesterday and LWBS from [facility] due to the weight.

## 2021-05-27 NOTE — ED Provider Notes (Signed)
[facility] EMERGENCY DEPT Provider Note   CSN: [id number] Arrival date & time: 05/27/21  1100     History Chief Complaint  Patient presents with   Arm Pain   Hand Pain         Tamara Barnes is a 55 y.o. female with DM HTN who presents today for evaluation after she fell down about 8 stairs on the 11th, three days ago.  She reports brief LOC.  She reports pain in the right arm, her head, and neck.  She was [facility].  She left with out being seen. She reports her pain was made better with naproxen, worsens with movement and pressure. 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     Family History  Problem Relation Age of Onset   Kidney failure Mother    Heart disease Mother    Liver disease Father    Stomach cancer Maternal Uncle 50   Colon cancer Neg Hx    Colon polyps Neg Hx    Esophageal cancer Neg Hx    Rectal cancer Neg Hx     Social History   Tobacco Use   Smoking status:  Every Day    Packs/day: 0.25    Types: Cigarettes   Smokeless tobacco: Never  Vaping Use   Vaping Use: Never used  Substance Use Topics   Alcohol use: Yes    Comment: socially   Drug use: No    Home Medications Prior to Admission medications   Medication Sig Start Date End Date Taking? Authorizing Provider  gabapentin (NEURONTIN) 300 MG capsule Take 1 capsule (300 mg total) by mouth at bedtime. 05/13/21  Yes Argentina Donovan, PA-C  HYDROcodone-acetaminophen (NORCO/VICODIN) 5-325 MG tablet Take 1 tablet by mouth every 6 (six) hours as needed for severe pain. 05/27/21  Yes Lorin Glass, PA-C  insulin isophane & regular human KwikPen (HUMULIN 70/30 MIX) (70-30) 100 UNIT/ML KwikPen Inject 16 Units into the skin 2 (two) times daily. With food 05/13/21 05/13/22 Yes McClung, Dionne Bucy, PA-C  lisinopril (ZESTRIL) 10 MG tablet Take 1 tablet (10 mg total) by mouth daily. 05/13/21  Yes Freeman Caldron M, PA-C  metFORMIN (GLUCOPHAGE) 1000 MG tablet Take 0.5 tablets (500 mg total) by mouth 2 (two) times daily with a meal. 05/13/21 05/13/22 Yes McClung, Dionne Bucy, PA-C  Vitamin D, Ergocalciferol, (DRISDOL) 1.25 MG (50000 UNIT) CAPS capsule Take 1 capsule (50,000 Units total) by  mouth every 7 (seven) days. 05/14/21  Yes McClung, Angela M, PA-C  hydrocortisone (ANUSOL-HC) 25 MG suppository Place 1 suppository (25 mg total) rectally 2 (two) times daily. Patient not taking: Reported on 05/27/2021 05/13/21   Argentina Donovan, PA-C  Insulin Pen Needle 32G X 4 MM MISC 1 each by Does not apply route in the morning and at bedtime. 05/13/21   McClung, Dionne Bucy, PA-C  Na Sulfate-K Sulfate-Mg Sulf 17.5-3.13-1.6 GM/177ML SOLN TAKE 1 KIT BY MOUTH ONCE FOR 1 DOSE. SUPREP AS DIRECTED. NO SUBSTITUTIONS Patient not taking: Reported on 05/27/2021 11/18/20 11/18/21  Jerene Bears, MD    Allergies    Oatmeal, Rice, and Wheat bran  Review of Systems   Review of Systems  Constitutional:  Negative for chills and fever.  HENT:   Negative for congestion.   Respiratory:  Negative for cough and shortness of breath.   Gastrointestinal:  Negative for abdominal pain.  Genitourinary:  Negative for dysuria.  Musculoskeletal:  Positive for neck pain (improving). Negative for back pain.       Pain and swelling of right hand/wrist  Skin:  Negative for color change, rash and wound.  Neurological:  Positive for headaches. Negative for weakness.  Psychiatric/Behavioral:  Negative for confusion.   All other systems reviewed and are negative.  Physical Exam Updated Vital Signs BP (!) 148/85 (BP Location: Left Arm)   Pulse 66   Temp 98.5 F (36.9 C) (Oral)   Resp 16   LMP 01/14/2015   SpO2 100%   Physical Exam Vitals and nursing note reviewed.  Constitutional:      General: She is not in acute distress.    Appearance: She is not diaphoretic.  HENT:     Head: Normocephalic and atraumatic.  Eyes:     General: No scleral icterus.       Right eye: No discharge.        Left eye: No discharge.     Conjunctiva/sclera: Conjunctivae normal.  Cardiovascular:     Rate and Rhythm: Normal rate and regular rhythm.     Comments: 2+ right radial pulse, fingers on right hand are warm and well-perfused. Pulmonary:     Effort: Pulmonary effort is normal. No respiratory distress.     Breath sounds: No stridor.  Abdominal:     General: There is no distension.  Musculoskeletal:        General: No deformity.     Cervical back: Normal range of motion and neck supple. No tenderness.     Comments: There is obvious edema around the right wrist extending into the fingers in the right hand.  There is pain with any movements of the right hand and wrist.  Compartments in the right forearm and upper arm are soft and easily compressible.  No tenderness or pain with elbow range of motion or right shoulder range of motion.  Skin:    General: Skin is warm and dry.  Neurological:     Mental Status: She is alert.     Motor: No abnormal muscle  tone.     Comments: Sensation intact to light touch to bilateral arms including right fingers.  Psychiatric:        Behavior: Behavior normal.    ED Results / Procedures / Treatments   Labs (all labs ordered are listed, but only abnormal results are displayed) Labs Reviewed - No data to display  EKG None  Radiology DG Cervical Spine Complete  Result Date: 05/25/2021 CLINICAL DATA:  Fall. EXAM: CERVICAL SPINE - COMPLETE 4+ VIEW COMPARISON:  None. FINDINGS: There is no acute fracture or subluxation of the cervical spine. Multilevel degenerative changes with disc space narrowing and endplate irregularity and anterior osteophyte. The visualized posterior elements and odontoid appear intact. There is anatomic alignment of the lateral masses of C1 and C2. The soft tissues are unremarkable. IMPRESSION: 1. No acute findings. 2. Multilevel degenerative changes. Electronically Signed   By: Anner Crete M.D.   On: 05/25/2021 01:53   DG Shoulder Right  Result Date: 05/25/2021 CLINICAL DATA:  Fall EXAM: RIGHT SHOULDER - 2+ VIEW COMPARISON:  None. FINDINGS: No fracture or dislocation is seen. The joint spaces are preserved. Visualized soft tissues are within normal limits. Visualized right lung is clear. IMPRESSION: Negative. Electronically Signed   By: Julian Hy M.D.   On: 05/25/2021 01:55   DG Elbow Complete Right  Result Date: 05/25/2021 CLINICAL DATA:  Fall. EXAM: RIGHT ELBOW - COMPLETE 3+ VIEW COMPARISON:  None. FINDINGS: There is no acute fracture or dislocation. The bones are well mineralized. No significant arthritic changes. No joint effusion. The soft tissues are unremarkable IMPRESSION: Negative. Electronically Signed   By: Anner Crete M.D.   On: 05/25/2021 01:54   DG Wrist Complete Right  Result Date: 05/25/2021 CLINICAL DATA:  Fall and trauma to the right upper extremity. EXAM: RIGHT HAND - COMPLETE 3+ VIEW; RIGHT WRIST - COMPLETE 3+ VIEW COMPARISON:  None. FINDINGS:  Minimally displaced fracture of the base of the fifth metacarpal. Irregularity of the base of the fourth metacarpal, likely chronic. A fracture scattered an acute fracture is less likely but not excluded clinical correlation is recommended. Mild degenerative changes of the interphalangeal joint as well as arthritic changes of the base of the thumb. No dislocation. The soft tissues are unremarkable. IMPRESSION: Minimally displaced fracture of the base of the fifth metacarpal. Electronically Signed   By: Anner Crete M.D.   On: 05/25/2021 01:57   CT HEAD WO CONTRAST (5MM)  Result Date: 05/25/2021 CLINICAL DATA:  Fall down steps, head injury EXAM: CT HEAD WITHOUT CONTRAST TECHNIQUE: Contiguous axial images were obtained from the base of the skull through the vertex without intravenous contrast. COMPARISON:  None. FINDINGS: Brain: No evidence of acute infarction, hemorrhage, hydrocephalus, extra-axial collection or mass lesion/mass effect. Vascular: No hyperdense vessel or unexpected calcification. Skull: Normal. Negative for fracture or focal lesion. Sinuses/Orbits: The visualized paranasal sinuses are essentially clear. The mastoid air cells are unopacified. Other: None. IMPRESSION: Normal head CT. Electronically Signed   By: Julian Hy M.D.   On: 05/25/2021 03:20   DG Hand Complete Right  Result Date: 05/25/2021 CLINICAL DATA:  Fall and trauma to the right upper extremity. EXAM: RIGHT HAND - COMPLETE 3+ VIEW; RIGHT WRIST - COMPLETE 3+ VIEW COMPARISON:  None. FINDINGS: Minimally displaced fracture of the base of the fifth metacarpal. Irregularity of the base of the fourth metacarpal, likely chronic. A fracture scattered an acute fracture is less likely but not excluded clinical correlation is recommended. Mild degenerative changes of the interphalangeal joint as well as arthritic changes of the base of the thumb. No dislocation. The soft tissues are unremarkable. IMPRESSION: Minimally displaced  fracture of the base of the fifth metacarpal. Electronically Signed   By: Anner Crete M.D.   On: 05/25/2021 01:57     Procedures Procedures   Medications Ordered in ED Medications  HYDROcodone-acetaminophen (NORCO/VICODIN) 5-325 MG per tablet 1 tablet (1 tablet Oral Given 05/27/21 1353)  ED Course  I have reviewed the triage vital signs and the nursing notes.  Pertinent labs & imaging results that were available during my care of the patient were reviewed by me and considered in my medical decision making (see chart for details).    MDM Rules/Calculators/A&P                          Patient presents today for evaluation after a fall that occurred 3 days ago.  She was seen 2 days ago at [facility] and had imaging obtained however left without being seen. She reports that overall her pains are slightly improving except for the pain in her right wrist.  X-rays I reviewed, they show a minimally displaced fracture of the base of the fifth metacarpal and an irregularity at the base of the fourth metacarpal likely chronic.  Unable to determine if this area is specifically painful due to patient's general pain and swelling in the area. Will place patient in a ulnar gutter splint.  She is given a sling for comfort with instructions to only wear it as needed when she is out of the house.  Educated on splint care.  [place] PMP is consulted, given a prescription for short course of narcotic pain medicine with instructions to primarily use this at night.  Additionally discussed conservative care including elevation.  She is given hand follow-up, and we discussed the importance of this as she is right-hand dominant. Given that her other pains including her neck pain are improving and she is able to turn her head without difficulties and appears neuro intact no indication for additional imaging at this time.  Additionally we discussed that her blood pressure is elevated.  She has a follow-up  appointment with her PCP next week as she just started on antihypertensives.  Encouraged to keep this appointment.  Note: Portions of this report may have been transcribed using voice recognition software. Every effort was made to ensure accuracy; however, inadvertent computerized transcription errors may be present     Final Clinical Impression(s) / ED Diagnoses Final diagnoses:  Closed displaced fracture of base of fifth metacarpal bone of right hand, initial encounter  Fall, initial encounter  Hypertension, unspecified type  Right arm pain    Rx / DC Orders ED Discharge Orders          Ordered    HYDROcodone-acetaminophen (NORCO/VICODIN) 5-325 MG tablet  Every 6 hours PRN        05/27/21 1244             Lorin Glass, Hershal Coria 05/27/21 2159    Gareth Morgan, MD 05/28/21 [phone]

## 2021-06-02 NOTE — Progress Notes (Signed)
S:    PCP: Johnson  Patient resents for hypertension, diabetes, and smoking cessation evaluation, education, and management. Patient was referred on 8/31 after seeing Angela. At that time, A1c was 7.7 and BG reported under 200. Metformin and insulin were increased. BP was 175/97, started on lisinopril 10 mg.  Also interested in smoking cessation but confused about Quit Public affairs consultant. Since that visit, she visited [facility] ED 9/12 after a fall down 8-10 steps and hit her head but left without being evaluated due to the wait. Returned on 9/14 to [facility] ED, found to have fracture of fifth metacarpal and put into a splint. BP was elevated at these visits though the patient was in pain.   Today, patient arrives with arm in a sling and fingers in a splint due to recent fall/fracture. Since her fall, her daughter has been helping her check her BG and BP. Last fasting BG as 170. Last BP 155/73 using bicep cuff. No missed doses or issues since starting lisinopril. She took this today around 8:30am. She thinks she needs a higher dose of lisinopril since these numbers are still high. She asks to review the results of her labs from last visit which have been reviewed with her. She needs the vitamin D sent to [facility] rather than [facility] which has been changed. Reports that her insulin costs $40 at [facility] but she is wondering if we can do anything to help her afford this. She is insured but has no income. She has tried metformin 1000 mg in the past but had significant GI adverse effects.    Family/Social History:  -Fhx: kidney and heart disease in mother -Tobacco: Every day smoker 0.25 ppd  Insurance coverage/medication affordability: [employer]  Medication adherence reported with no missed doses .   Current diabetes medications include: metformin 500 mg BID, Humulin 70/30 16 units BID Current hypertension medications include: lisinopril 10 mg daily Current hyperlipidemia medications  include: none  O:   Lab Results  Component Value Date   HGBA1C 7.7 (A) 05/13/2021   Vitals:   06/03/21 1057  BP: (!) 155/89  Pulse: 68    Lipid Panel     Component Value Date/Time   CHOL 174 05/13/2021 0944   TRIG 139 05/13/2021 0944   HDL 85 05/13/2021 0944   CHOLHDL 2.0 05/13/2021 0944   CHOLHDL 2.7 Ratio 06/04/2008 2136   VLDL 45 (H) 06/04/2008 2136   LDLCALC 66 05/13/2021 0944    Home fasting blood sugars: 170  2 hour post-meal/random blood sugars: none provided  Clinical Atherosclerotic Cardiovascular Disease (ASCVD): No  The 10-year ASCVD risk score (Arnett DK, et al., 2019) is: 25.4%   Values used to calculate the score:     Age: 8 years     Sex: Female     Is Non-Hispanic African American: Yes     Diabetic: Yes     Tobacco smoker: Yes     Systolic Blood Pressure: 409 mmHg     Is BP treated: Yes     HDL Cholesterol: 85 mg/dL     Total Cholesterol: 174 mg/dL   A/P: Hypertension longstanding currently above goal <130/80 mmHg with reading today of 155/89, however this is improved since last visit with having started lisinopril. Medication adherence appropriate. Blood pressure control is suboptimal due to requiring additional/increased antihypertensives. Given BP elevated >20/10 mmHg above goal, will make 2 medication changes today.  -Continue lisinopril 10 mg daily for now. Checking BMET today. If  stable, will increase lisinopril to 20 mg daily.  -Start amlodipine 5 mg daily.  -Check BP daily at home  Diabetes longstanding currently uncontrolled with most recent A1c of 7.7 on 05/13/21. Patient is able to verbalize appropriate hypoglycemia management plan. Medication adherence appears appropriate, however she reports having trouble affording her insulin which is $40 at [facility]. She has three days supply left and reports she does not have income to pay for this. Called her insurance and found that her Humulin 70/30 requires a PA (she has been getting OTC). Semglee is  covered and does not require a PA and we are told will only cost $16 for 2 pens. Will switch her insulin to an equivalent dosage of Semglee. She is also currently taking 1/2 tablets of metformin. Unable to tolerate higher dose due to GI effects, so will send in new prescription for 500 mg tablets of the extended release formulation so she no longer has to split tablets and hopefully help reduce GI effects.  -Continue metformin 500 mg BID but switch to XR formulation.  -Stop Humulin 70/30. Start Semglee 24 units daily.  -Continue checking BG at home -Extensively discussed pathophysiology of diabetes, recommended lifestyle interventions, dietary effects on blood sugar control  -Counseled on s/sx of and management of hypoglycemia -Next A1C anticipated at next PCP visit in December.   ASCVD risk - primary prevention in patient with diabetes. Last LDL is controlled at 66 on no medications. However, ASCVD risk score is >20%  - moderate intensity statin indicated.  -Will consider starting atorvastatin at next visit. Given multiple medication changes at this visit, will hold off on this for today.   Total time in face to face counseling 45 minutes. Follow up Pharmacist Clinic Visit in 2 weeks. Next PCP visit 12/1.    Tamara Barnes, PharmD PGY2 Ambulatory Care Pharmacy Resident 06/03/2021 11:43 AM

## 2021-06-03 ENCOUNTER — Ambulatory Visit: Payer: 59 | Attending: Internal Medicine | Admitting: Pharmacist

## 2021-06-03 ENCOUNTER — Other Ambulatory Visit: Payer: Self-pay

## 2021-06-03 VITALS — BP 155/89 | HR 68

## 2021-06-03 DIAGNOSIS — I10 Essential (primary) hypertension: Secondary | ICD-10-CM

## 2021-06-03 DIAGNOSIS — E1165 Type 2 diabetes mellitus with hyperglycemia: Secondary | ICD-10-CM

## 2021-06-03 MED ORDER — METFORMIN HCL ER 500 MG PO TB24: 500.0000 mg | ORAL_TABLET | Freq: Two times a day (BID) | ORAL | 2 refills | Status: DC

## 2021-06-03 MED ORDER — VITAMIN D (ERGOCALCIFEROL) 1.25 MG (50000 UNIT) PO CAPS: 50000.0000 [IU] | ORAL_CAPSULE | ORAL | 0 refills | Status: DC

## 2021-06-03 MED ORDER — INSULIN GLARGINE-YFGN 100 UNIT/ML ~~LOC~~ SOPN: 24.0000 [IU] | PEN_INJECTOR | Freq: Every day | SUBCUTANEOUS | 2 refills | Status: DC

## 2021-06-03 MED ORDER — AMLODIPINE BESYLATE 5 MG PO TABS: 5.0000 mg | ORAL_TABLET | Freq: Every day | ORAL | 2 refills | Status: DC

## 2021-06-03 MED ORDER — METFORMIN HCL ER 500 MG PO TB24
500.0000 mg | ORAL_TABLET | Freq: Every day | ORAL | 2 refills | Status: DC
Start: 2021-06-03 — End: 2021-06-03

## 2021-06-04 ENCOUNTER — Telehealth: Payer: Self-pay

## 2021-06-04 LAB — BASIC METABOLIC PANEL
BUN/Creatinine Ratio: 26 — ABNORMAL HIGH (ref 9–23)
BUN: 19 mg/dL (ref 6–24)
CO2: 25 mmol/L (ref 20–29)
Calcium: 9.9 mg/dL (ref 8.7–10.2)
Chloride: 103 mmol/L (ref 96–106)
Creatinine, Ser: 0.73 mg/dL (ref 0.57–1.00)
Glucose: 133 mg/dL — ABNORMAL HIGH (ref 65–99)
Potassium: 4.5 mmol/L (ref 3.5–5.2)
Sodium: 142 mmol/L (ref 134–144)
eGFR: 96 mL/min/{1.73_m2} (ref >59)

## 2021-06-04 NOTE — Telephone Encounter (Signed)
Contacted pt to go over lab results pt didn't answer lvm   Sent a CRM and forward labs to NT to give pt labs when they call back   

## 2021-06-19 ENCOUNTER — Other Ambulatory Visit: Payer: Self-pay

## 2021-06-19 ENCOUNTER — Ambulatory Visit: Payer: 59 | Attending: Internal Medicine | Admitting: Pharmacist

## 2021-06-19 ENCOUNTER — Encounter: Payer: Self-pay | Admitting: Pharmacist

## 2021-06-19 VITALS — BP 121/71

## 2021-06-19 DIAGNOSIS — E1165 Type 2 diabetes mellitus with hyperglycemia: Secondary | ICD-10-CM | POA: Diagnosis not present

## 2021-06-19 DIAGNOSIS — I10 Essential (primary) hypertension: Secondary | ICD-10-CM

## 2021-06-19 MED ORDER — METFORMIN HCL 1000 MG PO TABS
ORAL_TABLET | ORAL | 2 refills | Status: DC
  Filled 2021-06-19: qty 45, 30d supply, fill #0

## 2021-06-19 NOTE — Progress Notes (Signed)
S:    PCP: Johnson  Patient resents for hypertension, diabetes, and smoking cessation evaluation, education, and management. Patient was referred on 8/31 after seeing Angela. We saw her on 06/03/2021 and changed Humulin 70/30 to Semglee. We also changed metformin to XR d/t intolerance. We also started amlodipine.   Today, patient arrives in good spirits. She did not pick-up the Semglee. She was worried about cost. Additionally, she self dosed metformin to 1000 mg qAM and 500 mg qPM of the regular release form. She never started the XR form. She tells me that this works well for her and she cannot tolerate higher doses. She did pick-up the amlodipine and reports compliance to her lisinopril.   Family/Social History:  -Fhx: kidney and heart disease in mother -Tobacco: Every day smoker 0.25 ppd  Insurance coverage/medication affordability: [employer]  Medication adherence reported with no missed doses .   Current diabetes medications include: metformin 1000 mg qAM and 500 mg qPM, Humulin 70/30 16 units BID (did not start the Semglee or metformin XR).  Current hypertension medications include: amlodipine 5 mg daily, lisinopril 10 mg daily Current hyperlipidemia medications include: none  O:   Lab Results  Component Value Date   HGBA1C 7.7 (A) 05/13/2021   Vitals:   06/19/21 1004  BP: 121/71   Lipid Panel     Component Value Date/Time   CHOL 174 05/13/2021 0944   TRIG 139 05/13/2021 0944   HDL 85 05/13/2021 0944   CHOLHDL 2.0 05/13/2021 0944   CHOLHDL 2.7 Ratio 06/04/2008 2136   VLDL 45 (H) 06/04/2008 2136   LDLCALC 66 05/13/2021 0944   Home fasting blood sugars: no meter with him. Home CBGs reportedly in the 150s.   Clinical Atherosclerotic Cardiovascular Disease (ASCVD): No  The 10-year ASCVD risk score (Arnett DK, et al., 2019) is: 11.9%   Values used to calculate the score:     Age: 55 years     Sex: Female     Is Non-Hispanic African American: Yes      Diabetic: Yes     Tobacco smoker: Yes     Systolic Blood Pressure: 025 mmHg     Is BP treated: Yes     HDL Cholesterol: 85 mg/dL     Total Cholesterol: 174 mg/dL   A/P: Hypertension longstanding currently at goal. BP goal <130/80 mmHg. Medication adherence appropriate.  -Continue lisinopril 10 mg daily for now.  -Continue amlodipine 5 mg daily.  -Check BP daily at home -Extensively discussed pathophysiology of HTN, recommended lifestyle interventions, dietary effects on blood pressure control   Diabetes longstanding currently uncontrolled with most recent A1c of 7.7 on 05/13/21. At her last visit, we changed to [facility]. She continues to take Humulin 70/30 d/t miscommunication. Additionally, she never changed to XR form of metformin. She changed her dose to 1000 mg qAM and 500 mg qPM of the regular release. Patient is able to verbalize appropriate hypoglycemia management plan. -Continue metformin 1000 mg in the morning and 500 mg in the evening.  -Stop Humulin 70/30. Start Semglee 24 units daily. Called her pharmacy and they are filling this. She tells me that she will be able to afford the copay and she will start this.  -Continue checking BG at home -Extensively discussed pathophysiology of diabetes, recommended lifestyle interventions, dietary effects on blood sugar control  -Counseled on s/sx of and management of hypoglycemia -Next A1C anticipated at next PCP visit in December. .   Total time in face  to face counseling 45 minutes. Follow up Pharmacist Clinic Visit in 1 month. Next PCP visit 12/1.   Benard Halsted, PharmD, [facility] [place], [place] [phone]

## 2021-06-22 ENCOUNTER — Other Ambulatory Visit: Payer: Self-pay

## 2021-06-29 ENCOUNTER — Other Ambulatory Visit: Payer: Self-pay

## 2021-07-20 ENCOUNTER — Encounter: Payer: Self-pay | Admitting: Pharmacist

## 2021-07-20 ENCOUNTER — Other Ambulatory Visit: Payer: Self-pay

## 2021-07-20 ENCOUNTER — Ambulatory Visit: Payer: 59 | Attending: Internal Medicine | Admitting: Pharmacist

## 2021-07-20 DIAGNOSIS — I10 Essential (primary) hypertension: Secondary | ICD-10-CM | POA: Diagnosis not present

## 2021-07-20 MED ORDER — LOSARTAN POTASSIUM 50 MG PO TABS: 50.0000 mg | ORAL_TABLET | Freq: Every day | ORAL | 0 refills | Status: DC

## 2021-07-20 MED ORDER — AMLODIPINE BESYLATE 5 MG PO TABS: 5.0000 mg | ORAL_TABLET | Freq: Every day | ORAL | 1 refills | Status: DC

## 2021-07-20 MED ORDER — LANTUS SOLOSTAR 100 UNIT/ML ~~LOC~~ SOPN: 24.0000 [IU] | PEN_INJECTOR | Freq: Every day | SUBCUTANEOUS | 2 refills | Status: DC

## 2021-07-20 NOTE — Progress Notes (Signed)
    S:    PCP: Johnson  Patient resents for hypertension, diabetes, and smoking cessation evaluation, education, and management. Patient was referred on 8/31 after seeing Angela. We saw her on 06/19/2021. Her metformin was changed to 1000 mg in the morning and 500 mg in the evening. We also changed 70/30 to [facility].   Today, patient arrives in good spirits. She did not pick-up the Semglee. She was worried about cost. She tells me she now has Medicaid and Medicaid prefers Lantus. Additionally, she endorses persistent dry cough since starting lisinopril.   Family/Social History:  -Fhx: kidney and heart disease in mother -Tobacco: Every day smoker 0.25 ppd  Insurance coverage/medication affordability: [employer]  Medication adherence reported with no missed doses .   Current diabetes medications include: metformin 1000 mg qAM and 500 mg qPM, Humulin 70/30 16 units BID (did not start the [facility]).  Current hypertension medications include: amlodipine 5 mg daily, lisinopril 10 mg daily Current hyperlipidemia medications include: none  O:  Lab Results  Component Value Date   HGBA1C 7.7 (A) 05/13/2021   There were no vitals filed for this visit.  Lipid Panel     Component Value Date/Time   CHOL 174 05/13/2021 0944   TRIG 139 05/13/2021 0944   HDL 85 05/13/2021 0944   CHOLHDL 2.0 05/13/2021 0944   CHOLHDL 2.7 Ratio 06/04/2008 2136   VLDL 45 (H) 06/04/2008 2136   LDLCALC 66 05/13/2021 0944   Home fasting blood sugars: no meter with her. Home CBGs reportedly in the 150s.   Clinical Atherosclerotic Cardiovascular Disease (ASCVD): No  The 10-year ASCVD risk score (Arnett DK, et al., 2019) is: 11.9%   Values used to calculate the score:     Age: 55 years     Sex: Female     Is Non-Hispanic African American: Yes     Diabetic: Yes     Tobacco smoker: Yes     Systolic Blood Pressure: 165 mmHg     Is BP treated: Yes     HDL Cholesterol: 85 mg/dL     Total Cholesterol: 174  mg/dL   A/P: Hypertension longstanding currently at goal. BP goal <130/80 mmHg. Medication adherence appropriate.  -Stop lisinopril.  -Start losartan 50 mg daily.  -Continue amlodipine 5 mg daily.  -Check BP daily at home -Extensively discussed pathophysiology of HTN, recommended lifestyle interventions, dietary effects on blood pressure control   Diabetes longstanding currently uncontrolled with most recent A1c of 7.7 on 05/13/21. At her last visit, we changed to [facility]. Lantus is covered per her insurance. Patient is able to verbalize appropriate hypoglycemia management plan. -Continue metformin 1000 mg in the morning and 500 mg in the evening.  -Stop Humulin 70/30.  -Start Lantus 24 units daily.  -Continue checking BG at home -Extensively discussed pathophysiology of diabetes, recommended lifestyle interventions, dietary effects on blood sugar control  -Counseled on s/sx of and management of hypoglycemia -Next A1C anticipated at next PCP visit in December.   Total time in face to face counseling 45 minutes. Next PCP visit 12/1.   Benard Halsted, PharmD, [facility] [place], [place] [phone]

## 2021-08-13 ENCOUNTER — Ambulatory Visit: Payer: 59 | Admitting: Internal Medicine

## 2021-09-02 ENCOUNTER — Other Ambulatory Visit: Payer: Self-pay

## 2021-09-03 ENCOUNTER — Ambulatory Visit: Payer: 59 | Admitting: Physician Assistant

## 2021-09-04 ENCOUNTER — Telehealth: Payer: Self-pay | Admitting: Internal Medicine

## 2021-09-04 NOTE — Telephone Encounter (Signed)
Patient called to reschedule with her PCP since she missed the visit with [facility] yesterday. Call placed and transferred to the front to reschedule.

## 2021-09-04 NOTE — Telephone Encounter (Signed)
Copied from [place] [id number]. Topic: General - Other >> Sep 03, 2021  3:13 PM Tessa Lerner A wrote: Reason for CRM: The patient would like to be contacted by Dr. Lurena Joiner when possible  The patient has concerns with a missed appointment today 09/03/21 and would like to address them as well as discuss their care  Please contact further when available

## 2021-10-01 ENCOUNTER — Ambulatory Visit: Payer: Medicaid Other | Attending: Physician Assistant | Admitting: Physician Assistant

## 2021-10-01 ENCOUNTER — Other Ambulatory Visit: Payer: Self-pay

## 2021-10-01 VITALS — BP 132/81 | HR 84 | Temp 98.4°F | Wt 148.0 lb

## 2021-10-01 DIAGNOSIS — S90222A Contusion of left lesser toe(s) with damage to nail, initial encounter: Secondary | ICD-10-CM

## 2021-10-01 DIAGNOSIS — E1165 Type 2 diabetes mellitus with hyperglycemia: Secondary | ICD-10-CM

## 2021-10-01 DIAGNOSIS — G629 Polyneuropathy, unspecified: Secondary | ICD-10-CM

## 2021-10-01 DIAGNOSIS — I10 Essential (primary) hypertension: Secondary | ICD-10-CM

## 2021-10-01 DIAGNOSIS — E785 Hyperlipidemia, unspecified: Secondary | ICD-10-CM

## 2021-10-01 DIAGNOSIS — S90112A Contusion of left great toe without damage to nail, initial encounter: Secondary | ICD-10-CM

## 2021-10-01 LAB — POCT GLYCOSYLATED HEMOGLOBIN (HGB A1C): HbA1c, POC (controlled diabetic range): 6.5 % (ref 0.0–7.0)

## 2021-10-01 LAB — GLUCOSE, POCT (MANUAL RESULT ENTRY): POC Glucose: 109 mg/dl — AB (ref 70–99)

## 2021-10-01 MED ORDER — METFORMIN HCL 1000 MG PO TABS: ORAL_TABLET | ORAL | 2 refills | Status: DC

## 2021-10-01 MED ORDER — LANTUS SOLOSTAR 100 UNIT/ML ~~LOC~~ SOPN: 24.0000 [IU] | PEN_INJECTOR | Freq: Every day | SUBCUTANEOUS | 2 refills | Status: DC

## 2021-10-01 MED ORDER — INSULIN PEN NEEDLE 32G X 4 MM MISC: 1.0000 | Freq: Two times a day (BID) | 3 refills | Status: DC

## 2021-10-01 MED ORDER — GABAPENTIN 300 MG PO CAPS: 300.0000 mg | ORAL_CAPSULE | Freq: Every day | ORAL | 3 refills | Status: DC

## 2021-10-01 MED ORDER — LOSARTAN POTASSIUM 50 MG PO TABS: 50.0000 mg | ORAL_TABLET | Freq: Every day | ORAL | 0 refills | Status: DC

## 2021-10-01 MED ORDER — AMLODIPINE BESYLATE 5 MG PO TABS: 5.0000 mg | ORAL_TABLET | Freq: Every day | ORAL | 1 refills | Status: DC

## 2021-10-01 NOTE — Progress Notes (Signed)
Patient ID: Tamara Barnes, female   DOB: [birth year], 56 y.o.   MRN: [id number]   Tamara Barnes, is a 56 y.o. female  [id number]:[id number]  EQA:[id number]  DOB - [birth year]  No chief complaint on file.      Subjective:   Tamara Barnes is a 56 y.o. female here today for med RF and to have her L great toe checked.  Blood sugars much improved.  Usus around 150 when she checks it.  L great toe was stepped on by grandson a few weeks ago.  Initially painful but not now.  No other issues or concerns today.    Patient has No headache, No chest pain, No abdominal pain - No Nausea, No new weakness tingling or numbness, No Cough - SOB.  No problems updated.  ALLERGIES: Allergies  Allergen Reactions   Oatmeal Shortness Of Breath and Other (See Comments)    Tongue swells   Rice Shortness Of Breath, Swelling and Other (See Comments)    Tongue swells   Wheat Bran Shortness Of Breath, Swelling and Other (See Comments)    Tongue swells   Lisinopril Cough    PAST MEDICAL HISTORY: Past Medical History:  Diagnosis Date   Diabetes mellitus without complication ([place])    Hypertension     MEDICATIONS AT HOME: Prior to Admission medications   Medication Sig Start Date End Date Taking? Authorizing Provider  amLODipine (NORVASC) 5 MG tablet Take 1 tablet (5 mg total) by mouth daily. 10/01/21   Argentina Donovan, PA-C  gabapentin (NEURONTIN) 300 MG capsule Take 1 capsule (300 mg total) by mouth at bedtime. 10/01/21   Argentina Donovan, PA-C  HYDROcodone-acetaminophen (NORCO/VICODIN) 5-325 MG tablet Take 1 tablet by mouth every 6 (six) hours as needed for severe pain. Patient not taking: Reported on 10/01/2021 05/27/21   Lorin Glass, PA-C  hydrocortisone (ANUSOL-HC) 25 MG suppository Place 1 suppository (25 mg total) rectally 2 (two) times daily. Patient not taking: Reported on 05/27/2021 05/13/21   Argentina Donovan, PA-C  insulin glargine (LANTUS SOLOSTAR) 100 UNIT/ML Solostar Pen Inject 24  Units into the skin daily. 10/01/21   Argentina Donovan, PA-C  Insulin Pen Needle 32G X 4 MM MISC 1 each by Does not apply route in the morning and at bedtime. 10/01/21   Argentina Donovan, PA-C  losartan (COZAAR) 50 MG tablet Take 1 tablet (50 mg total) by mouth daily. 10/01/21   Argentina Donovan, PA-C  metFORMIN (GLUCOPHAGE) 1000 MG tablet Take 1 tablet (1000mg ) by mouth in the morning and 1/2 tablet (500mg ) by mouth in the evening. 10/01/21   Argentina Donovan, PA-C  Vitamin D, Ergocalciferol, (DRISDOL) 1.25 MG (50000 UNIT) CAPS capsule Take 1 capsule (50,000 Units total) by mouth every 7 (seven) days. Patient not taking: Reported on 10/01/2021 06/03/21   Ladell Pier, MD    ROS: Neg HEENT Neg resp Neg cardiac Neg GI Neg GU Neg MS Neg psych Neg neuro  Objective:   Vitals:   10/01/21 1018  BP: 132/81  Pulse: 84  Temp: 98.4 F (36.9 C)  TempSrc: Oral  SpO2: 97%  Weight: 148 lb (67.1 kg)   Exam General appearance : Awake, alert, not in any distress. Speech Clear. Not toxic looking HEENT: Atraumatic and Normocephalic Neck: Supple, no JVD. No cervical lymphadenopathy.  Chest: Good air entry bilaterally, CTAB.  No rales/rhonchi/wheezing CVS: S1 S2 regular, no murmurs.  L great toe with non-compressed subungual hematoma.  Non tender.  Capillary RF of  all toes WNL.   Extremities: B/L Lower Ext shows no edema, both legs are warm to touch Neurology: Awake alert, and oriented X 3, CN II-XII intact, Non focal Skin: No Rash  Data Review Lab Results  Component Value Date   HGBA1C 6.5 10/01/2021   HGBA1C 7.7 (A) 05/13/2021   HGBA1C 11.0 (H) 04/07/2020    Assessment & Plan   1. Type 2 diabetes mellitus with hyperglycemia, unspecified whether long term insulin use (HCC) Controlled-continue current regimen - Glucose (CBG) - HgB A1c - metFORMIN (GLUCOPHAGE) 1000 MG tablet; Take 1 tablet (1000mg ) by mouth in the morning and 1/2 tablet (500mg ) by mouth in the evening.  Dispense:  45 tablet; Refill: 2 - insulin glargine (LANTUS SOLOSTAR) 100 UNIT/ML Solostar Pen; Inject 24 Units into the skin daily.  Dispense: 15 mL; Refill: 2 - Insulin Pen Needle 32G X 4 MM MISC; 1 each by Does not apply route in the morning and at bedtime.  Dispense: 100 each; Refill: 3 - Comprehensive metabolic panel  2. Hypertension, unspecified type Adequate control-continue - amLODipine (NORVASC) 5 MG tablet; Take 1 tablet (5 mg total) by mouth daily.  Dispense: 90 tablet; Refill: 1 - losartan (COZAAR) 50 MG tablet; Take 1 tablet (50 mg total) by mouth daily.  Dispense: 90 tablet; Refill: 0  3. Neuropathy - gabapentin (NEURONTIN) 300 MG capsule; Take 1 capsule (300 mg total) by mouth at bedtime.  Dispense: 90 capsule; Refill: 3  4. Hyperlipidemia, unspecified hyperlipidemia type - Lipid panel  5. Contusion of left great toe without damage to nail, initial encounter    Patient have been counseled extensively about nutrition and exercise. Other issues discussed during this visit include: low cholesterol diet, weight control and daily exercise, foot care, annual eye examinations at Ophthalmology, importance of adherence with medications and regular follow-up. We also discussed long term complications of uncontrolled diabetes and hypertension.   Return in about 3 months (around 12/30/2021) for chronic conditions.  The patient was given clear instructions to go to ER or return to medical center if symptoms don't improve, worsen or new problems develop. The patient verbalized understanding. The patient was told to call to get lab results if they haven't heard anything in the next week.      Freeman Caldron, PA-C [facility] [place], [place]   10/01/2021, 10:48 AM

## 2021-10-01 NOTE — Progress Notes (Signed)
Bruise on left great toe Pain in left leg Off insulin x 2 day d/t finances

## 2021-10-02 LAB — COMPREHENSIVE METABOLIC PANEL
ALT: 13 IU/L (ref 0–32)
AST: 16 IU/L (ref 0–40)
Albumin/Globulin Ratio: 1.5 (ref 1.2–2.2)
Albumin: 4.6 g/dL (ref 3.8–4.9)
Alkaline Phosphatase: 95 IU/L (ref 44–121)
BUN/Creatinine Ratio: 22 (ref 9–23)
BUN: 16 mg/dL (ref 6–24)
Bilirubin Total: <0.2 mg/dL (ref 0.0–1.2)
CO2: 25 mmol/L (ref 20–29)
Calcium: 10.3 mg/dL — ABNORMAL HIGH (ref 8.7–10.2)
Chloride: 106 mmol/L (ref 96–106)
Creatinine, Ser: 0.74 mg/dL (ref 0.57–1.00)
Globulin, Total: 3 g/dL (ref 1.5–4.5)
Glucose: 127 mg/dL — ABNORMAL HIGH (ref 70–99)
Potassium: 4.6 mmol/L (ref 3.5–5.2)
Sodium: 145 mmol/L — ABNORMAL HIGH (ref 134–144)
Total Protein: 7.6 g/dL (ref 6.0–8.5)
eGFR: 95 mL/min/{1.73_m2} (ref >59)

## 2021-10-02 LAB — LIPID PANEL
Chol/HDL Ratio: 2.4 ratio (ref 0.0–4.4)
Cholesterol, Total: 155 mg/dL (ref 100–199)
HDL: 65 mg/dL (ref >39)
LDL Chol Calc (NIH): 61 mg/dL (ref 0–99)
Triglycerides: 174 mg/dL — ABNORMAL HIGH (ref 0–149)
VLDL Cholesterol Cal: 29 mg/dL (ref 5–40)

## 2021-11-26 ENCOUNTER — Telehealth: Payer: Self-pay

## 2021-11-26 NOTE — Telephone Encounter (Signed)
called pt to move appt to another date [phone] ?

## 2021-12-07 ENCOUNTER — Other Ambulatory Visit: Payer: Self-pay | Admitting: Internal Medicine

## 2021-12-07 DIAGNOSIS — E1165 Type 2 diabetes mellitus with hyperglycemia: Secondary | ICD-10-CM

## 2021-12-07 DIAGNOSIS — I10 Essential (primary) hypertension: Secondary | ICD-10-CM

## 2021-12-07 NOTE — Telephone Encounter (Signed)
Medication: losartan (COZAAR) 50 MG tablet [[id number]], metFORMIN (GLUCOPHAGE) 1000 MG tablet [[id number]] , amLODipine (NORVASC) 5 MG tablet [[id number]], insulin glargine (LANTUS SOLOSTAR) 100 UNIT/ML Solostar Pen [[id number]]  ? ?Has the patient contacted their pharmacy? NO ?(Agent: If no, request that the patient contact the pharmacy for the refill. If patient does not wish to contact the pharmacy document the reason why and proceed with request.) ?(Agent: If yes, when and what did the pharmacy advise?) ? ?Preferred Pharmacy (with phone number or street name): [place] ([place]), [place] - [address] ?[address] [place] ([place] [address] ?Phone: [phone] Fax: [phone] ?Hours: Not open 24 hours ?Has the patient been seen for an appointment in the last year OR does the patient have an upcoming appointment? YES  ? ?Agent: Please be advised that RX refills may take up to 3 business days. We ask that you follow-up with your pharmacy. ?

## 2021-12-09 ENCOUNTER — Other Ambulatory Visit: Payer: Self-pay | Admitting: Internal Medicine

## 2021-12-09 ENCOUNTER — Other Ambulatory Visit: Payer: Self-pay | Admitting: Physician Assistant

## 2021-12-09 DIAGNOSIS — E1165 Type 2 diabetes mellitus with hyperglycemia: Secondary | ICD-10-CM

## 2021-12-09 DIAGNOSIS — I10 Essential (primary) hypertension: Secondary | ICD-10-CM

## 2021-12-09 MED ORDER — METFORMIN HCL 1000 MG PO TABS
ORAL_TABLET | ORAL | 2 refills | Status: DC
Start: 2021-12-09 — End: 2021-12-24

## 2021-12-09 NOTE — Telephone Encounter (Signed)
Spoke with pharm staff who states pt has refills on Norvasc and  ?

## 2021-12-09 NOTE — Telephone Encounter (Signed)
Cozaar already filled, called pharm to verify refills remaining on Norvasc and Lantus. ?Requested Prescriptions  ?Pending Prescriptions Disp Refills  ?? losartan (COZAAR) 50 MG tablet 90 tablet 0  ?  Sig: Take 1 tablet (50 mg total) by mouth daily.  ?  ? Cardiovascular:  Angiotensin Receptor Blockers Passed - 12/08/2021  8:05 AM  ?  ?  Passed - Cr in normal range and within 180 days  ?  Creatinine, Ser  ?Date Value Ref Range Status  ?10/01/2021 0.74 0.57 - 1.00 mg/dL Final  ?   ?  ?  Passed - K in normal range and within 180 days  ?  Potassium  ?Date Value Ref Range Status  ?10/01/2021 4.6 3.5 - 5.2 mmol/L Final  ?   ?  ?  Passed - Patient is not pregnant  ?  ?  Passed - Last BP in normal range  ?  BP Readings from Last 1 Encounters:  ?10/01/21 132/81  ?   ?  ?  Passed - Valid encounter within last 6 months  ?  Recent Outpatient Visits   ?      ? 2 months ago Type 2 diabetes mellitus with hyperglycemia, unspecified whether long term insulin use ([place])  ? [address] [place], [place], [place]  ? 4 months ago Hypertension, unspecified type  ? [place], RPH-CPP  ? 5 months ago Hypertension, unspecified type  ? [place], RPH-CPP  ? 6 months ago Hypertension, unspecified type  ? [place], RPH-CPP  ? 7 months ago Type 2 diabetes mellitus with hyperglycemia, unspecified whether long term insulin use ([place])  ? [place], [place], [place]  ?  ?  ?Future Appointments   ?        ? In 2 weeks Thereasa Solo, Casimer Bilis [place]  ?  ? ?  ?  ?  ?? metFORMIN (GLUCOPHAGE) 1000 MG tablet 45 tablet 2  ?  Sig: Take 1 tablet (1073m) by mouth in the morning and 1/2 tablet (5073m by mouth in the evening.  ?  ? Endocrinology:  Diabetes - Biguanides Failed - 12/08/2021  8:05 AM  ?   ?  Failed - B12 Level in normal range and within 720 days  ?  No results found for: VITAMINB12   ?  ?  Passed - Cr in normal range and within 360 days  ?  Creatinine, Ser  ?Date Value Ref Range Status  ?10/01/2021 0.74 0.57 - 1.00 mg/dL Final  ?   ?  ?  Passed - HBA1C is between 0 and 7.9 and within 180 days  ?  HbA1c, POC (controlled diabetic range)  ?Date Value Ref Range Status  ?10/01/2021 6.5 0.0 - 7.0 % Final  ?   ?  ?  Passed - eGFR in normal range and within 360 days  ?  GFR calc Af Amer  ?Date Value Ref Range Status  ?05/01/2020 123 >59 mL/min/1.73 Final  ?  Comment:  ?  **[facility] currently reports eGFR in compliance with the current** ?  recommendations of the [employer] will ?  update reporting as new guidelines are published from the NKF-ASN ?  Task force. ?  ? ?GFR calc non Af Amer  ?Date Value Ref Range Status  ?  05/01/2020 107 >59 mL/min/1.73 Final  ? ?eGFR  ?Date Value Ref Range Status  ?10/01/2021 95 >59 mL/min/1.73 Final  ?   ?  ?  Passed - Valid encounter within last 6 months  ?  Recent Outpatient Visits   ?      ? 2 months ago Type 2 diabetes mellitus with hyperglycemia, unspecified whether long term insulin use ([place])  ? [place], [place], [place]  ? 4 months ago Hypertension, unspecified type  ? [place], RPH-CPP  ? 5 months ago Hypertension, unspecified type  ? [place], RPH-CPP  ? 6 months ago Hypertension, unspecified type  ? [place], RPH-CPP  ? 7 months ago Type 2 diabetes mellitus with hyperglycemia, unspecified whether long term insulin use ([place])  ? [place], [place], [place]  ?  ?  ?Future Appointments   ?        ? In 2 weeks Thereasa Solo, Casimer Bilis [place]  ?  ? ?  ?  ?  Passed - CBC  within normal limits and completed in the last 12 months  ?  WBC  ?Date Value Ref Range Status  ?05/13/2021 4.9 3.4 - 10.8 x10E3/uL Final  ?04/09/2020 10.2 4.0 - 10.5 K/uL Final  ? ?RBC  ?Date Value Ref Range Status  ?05/13/2021 5.08 3.77 - 5.28 x10E6/uL Final  ?04/09/2020 4.30 3.87 - 5.11 MIL/uL Final  ? ?Hemoglobin  ?Date Value Ref Range Status  ?05/13/2021 15.0 11.1 - 15.9 g/dL Final  ? ?Hematocrit  ?Date Value Ref Range Status  ?05/13/2021 45.6 34.0 - 46.6 % Final  ? ?MCHC  ?Date Value Ref Range Status  ?05/13/2021 32.9 31.5 - 35.7 g/dL Final  ?04/09/2020 32.5 30.0 - 36.0 g/dL Final  ? ?MCH  ?Date Value Ref Range Status  ?05/13/2021 29.5 26.6 - 33.0 pg Final  ?04/09/2020 29.1 26.0 - 34.0 pg Final  ? ?MCV  ?Date Value Ref Range Status  ?05/13/2021 90 79 - 97 fL Final  ? ?No results found for: PLTCOUNTKUC, LABPLAT, [place] ?RDW  ?Date Value Ref Range Status  ?05/13/2021 12.6 11.7 - 15.4 % Final  ? ?  ?  ?  ?? amLODipine (NORVASC) 5 MG tablet 90 tablet 1  ?  Sig: Take 1 tablet (5 mg total) by mouth daily.  ?  ? Cardiovascular: Calcium Channel Blockers 2 Passed - 12/08/2021  8:05 AM  ?  ?  Passed - Last BP in normal range  ?  BP Readings from Last 1 Encounters:  ?10/01/21 132/81  ?   ?  ?  Passed - Last Heart Rate in normal range  ?  Pulse Readings from Last 1 Encounters:  ?10/01/21 84  ?   ?  ?  Passed - Valid encounter within last 6 months  ?  Recent Outpatient Visits   ?      ? 2 months ago Type 2 diabetes mellitus with hyperglycemia, unspecified whether long term insulin use ([place])  ? [place], [place], [place]  ? 4 months ago Hypertension, unspecified type  ? [place], RPH-CPP  ? 5 months ago Hypertension, unspecified type  ? [place],  RPH-CPP  ? 6 months ago Hypertension, unspecified type  ? [place], RPH-CPP   ? 7 months ago Type 2 diabetes mellitus with hyperglycemia, unspecified whether long term insulin use ([place])  ? [place], [place], [place]  ?  ?  ?Future Appointments   ?        ? In 2 weeks Thereasa Solo, Casimer Bilis [place]  ?  ? ?  ?  ?  ?? insulin glargine (LANTUS SOLOSTAR) 100 UNIT/ML Solostar Pen 15 mL 2  ?  Sig: Inject 24 Units into the skin daily.  ?  ? Endocrinology:  Diabetes - Insulins Passed - 12/08/2021  8:05 AM  ?  ?  Passed - HBA1C is between 0 and 7.9 and within 180 days  ?  HbA1c, POC (controlled diabetic range)  ?Date Value Ref Range Status  ?10/01/2021 6.5 0.0 - 7.0 % Final  ?   ?  ?  Passed - Valid encounter within last 6 months  ?  Recent Outpatient Visits   ?      ? 2 months ago Type 2 diabetes mellitus with hyperglycemia, unspecified whether long term insulin use ([place])  ? [place], [place], [place]  ? 4 months ago Hypertension, unspecified type  ? [place], RPH-CPP  ? 5 months ago Hypertension, unspecified type  ? [place], RPH-CPP  ? 6 months ago Hypertension, unspecified type  ? [place], RPH-CPP  ? 7 months ago Type 2 diabetes mellitus with hyperglycemia, unspecified whether long term insulin use ([place])  ? [place], [place], [place]  ?  ?  ?Future Appointments   ?        ? In 2 weeks Thereasa Solo, Casimer Bilis [place]  ?  ? ?  ?  ?  ? ? ?

## 2021-12-24 ENCOUNTER — Encounter: Payer: Self-pay | Admitting: Physician Assistant

## 2021-12-24 ENCOUNTER — Ambulatory Visit: Payer: Self-pay | Attending: Internal Medicine | Admitting: Physician Assistant

## 2021-12-24 VITALS — BP 142/89 | HR 76 | Wt 155.8 lb

## 2021-12-24 DIAGNOSIS — E1140 Type 2 diabetes mellitus with diabetic neuropathy, unspecified: Secondary | ICD-10-CM | POA: Insufficient documentation

## 2021-12-24 DIAGNOSIS — I10 Essential (primary) hypertension: Secondary | ICD-10-CM | POA: Insufficient documentation

## 2021-12-24 DIAGNOSIS — M25512 Pain in left shoulder: Secondary | ICD-10-CM | POA: Insufficient documentation

## 2021-12-24 DIAGNOSIS — M25641 Stiffness of right hand, not elsewhere classified: Secondary | ICD-10-CM

## 2021-12-24 DIAGNOSIS — M25511 Pain in right shoulder: Secondary | ICD-10-CM | POA: Insufficient documentation

## 2021-12-24 DIAGNOSIS — Z79899 Other long term (current) drug therapy: Secondary | ICD-10-CM | POA: Insufficient documentation

## 2021-12-24 DIAGNOSIS — Z794 Long term (current) use of insulin: Secondary | ICD-10-CM | POA: Insufficient documentation

## 2021-12-24 DIAGNOSIS — M25642 Stiffness of left hand, not elsewhere classified: Secondary | ICD-10-CM

## 2021-12-24 DIAGNOSIS — E1165 Type 2 diabetes mellitus with hyperglycemia: Secondary | ICD-10-CM | POA: Insufficient documentation

## 2021-12-24 DIAGNOSIS — G629 Polyneuropathy, unspecified: Secondary | ICD-10-CM

## 2021-12-24 LAB — POCT GLYCOSYLATED HEMOGLOBIN (HGB A1C): HbA1c, POC (controlled diabetic range): 6.6 % (ref 0.0–7.0)

## 2021-12-24 LAB — GLUCOSE, POCT (MANUAL RESULT ENTRY): POC Glucose: 149 mg/dl — AB (ref 70–99)

## 2021-12-24 MED ORDER — LANTUS SOLOSTAR 100 UNIT/ML ~~LOC~~ SOPN: 24.0000 [IU] | PEN_INJECTOR | Freq: Every day | SUBCUTANEOUS | 2 refills | Status: DC

## 2021-12-24 MED ORDER — AMLODIPINE BESYLATE 5 MG PO TABS: 5.0000 mg | ORAL_TABLET | Freq: Every day | ORAL | 1 refills | Status: DC

## 2021-12-24 MED ORDER — CYCLOBENZAPRINE HCL 10 MG PO TABS: 10.0000 mg | ORAL_TABLET | Freq: Every day | ORAL | 0 refills | Status: DC

## 2021-12-24 MED ORDER — LOSARTAN POTASSIUM 50 MG PO TABS: 50.0000 mg | ORAL_TABLET | Freq: Every day | ORAL | 1 refills | Status: DC

## 2021-12-24 MED ORDER — GABAPENTIN 300 MG PO CAPS: 300.0000 mg | ORAL_CAPSULE | Freq: Every day | ORAL | 3 refills | Status: DC

## 2021-12-24 MED ORDER — METFORMIN HCL 1000 MG PO TABS: ORAL_TABLET | ORAL | 2 refills | Status: DC

## 2021-12-24 NOTE — Progress Notes (Signed)
Patient ID: Tamara Barnes, female   DOB: November 14, 1965, 56 y.o.   MRN: [id number] ? ? ?Tamara Barnes, is a 56 y.o. female ? ?[id number] ? ?[id number] ? ?DOB - 10-16-1965 ? ?Chief Complaint  ?Patient presents with  ? Diabetes  ? Medication Refill  ? Shoulder Pain  ?    ? ?Subjective:  ? ?Tamara Barnes is a 56 y.o. female here today for a med RF and various orthopedic concerns.  She had a fracture of her R 5th metacarpal 05/2021.  She was being followed by ortho and her insurance changed so she was lost to follow up.  She is unable to bend or fully straighten the 4th and 5th fingers of her R hand. She is having Bo shoulder pain secondary to known arthritis.   ? ?Reports Compliance with medication regimen.  Blood sugars usu around 150 at home.  No hypoglycemia. ? ?No problems updated. ? ?ALLERGIES: ?Allergies  ?Allergen Reactions  ? Oatmeal Shortness Of Breath and Other (See Comments)  ?  Tongue swells  ? Rice Shortness Of Breath, Swelling and Other (See Comments)  ?  Tongue swells  ? Wheat Bran Shortness Of Breath, Swelling and Other (See Comments)  ?  Tongue swells  ? Lisinopril Cough  ? ? ?PAST MEDICAL HISTORY: ?Past Medical History:  ?Diagnosis Date  ? Diabetes mellitus without complication ([place])   ? Hypertension   ? ? ?MEDICATIONS AT HOME: ?Prior to Admission medications   ?Medication Sig Start Date End Date Taking? Authorizing Provider  ?cyclobenzaprine (FLEXERIL) 10 MG tablet Take 1 tablet (10 mg total) by mouth at bedtime. Prn pain 12/24/21  Yes Argentina Donovan, PA-C  ?Insulin Pen Needle 32G X 4 MM MISC 1 each by Does not apply route in the morning and at bedtime. 10/01/21  Yes Argentina Donovan, PA-C  ?Vitamin D, Ergocalciferol, (DRISDOL) 1.25 MG (50000 UNIT) CAPS capsule Take 1 capsule (50,000 Units total) by mouth every 7 (seven) days. 06/03/21  Yes Ladell Pier, MD  ?amLODipine (NORVASC) 5 MG tablet Take 1 tablet (5 mg total) by mouth daily. 12/24/21   Argentina Donovan, PA-C  ?gabapentin  (NEURONTIN) 300 MG capsule Take 1 capsule (300 mg total) by mouth at bedtime. 12/24/21   Argentina Donovan, PA-C  ?HYDROcodone-acetaminophen (NORCO/VICODIN) 5-325 MG tablet Take 1 tablet by mouth every 6 (six) hours as needed for severe pain. ?Patient not taking: Reported on 12/24/2021 05/27/21   Lorin Glass, PA-C  ?hydrocortisone (ANUSOL-HC) 25 MG suppository Place 1 suppository (25 mg total) rectally 2 (two) times daily. ?Patient not taking: Reported on 12/24/2021 05/13/21   Argentina Donovan, PA-C  ?insulin glargine (LANTUS SOLOSTAR) 100 UNIT/ML Solostar Pen Inject 24 Units into the skin daily. 12/24/21   Argentina Donovan, PA-C  ?losartan (COZAAR) 50 MG tablet Take 1 tablet (50 mg total) by mouth daily. 12/24/21   Argentina Donovan, PA-C  ?metFORMIN (GLUCOPHAGE) 1000 MG tablet Take 1 tablet ('1000mg'$ ) by mouth in the morning and 1/2 tablet ('500mg'$ ) by mouth in the evening. 12/24/21   Argentina Donovan, PA-C  ? ? ?ROS: ?Neg HEENT ?Neg resp ?Neg cardiac ?Neg GI ?Neg GU ?Neg psych ?Neg neuro ? ?Objective:  ? ?Vitals:  ? 12/24/21 1618  ?BP: (!) 142/89  ?Pulse: 76  ?SpO2: 96%  ?Weight: 155 lb 12.8 oz (70.7 kg)  ? ?Exam ?General appearance : Awake, alert, not in any distress. Speech Clear. Not toxic looking ?HEENT: Atraumatic and Normocephalic ?Neck: Supple, no JVD. No cervical  lymphadenopathy.  ?Chest: Good air entry bilaterally, CTAB.  No rales/rhonchi/wheezing ?CVS: S1 S2 regular, no murmurs.  ?Ligaments stable B shoulders.  R hand she is wearing a carpal tunnel type splint.  Decreased ROM of 4th and 5th fingers.  N-V intact ?Extremities: B/L Lower Ext shows no edema, both legs are warm to touch ?Neurology: Awake alert, and oriented X 3, CN II-XII intact, Non focal ?Skin: No Rash ? ?Data Review ?Lab Results  ?Component Value Date  ? HGBA1C 6.6 12/24/2021  ? HGBA1C 6.5 10/01/2021  ? HGBA1C 7.7 (A) 05/13/2021  ? ? ?Assessment & Plan  ? ?1. Type 2 diabetes mellitus with hyperglycemia, unspecified whether long term  insulin use ([place]) ?Continue current regimen and continue to work on diabetic diet ?- Glucose (CBG) ?- HgB A1c ?- insulin glargine (LANTUS SOLOSTAR) 100 UNIT/ML Solostar Pen; Inject 24 Units into the skin daily.  Dispense: 15 mL; Refill: 2 ?- metFORMIN (GLUCOPHAGE) 1000 MG tablet; Take 1 tablet ('1000mg'$ ) by mouth in the morning and 1/2 tablet ('500mg'$ ) by mouth in the evening.  Dispense: 45 tablet; Refill: 2 ? ?2. Neuropathy ?- gabapentin (NEURONTIN) 300 MG capsule; Take 1 capsule (300 mg total) by mouth at bedtime.  Dispense: 90 capsule; Refill: 3 ? ?3. Hypertension, unspecified type ?She did not take her meds this morning ?- losartan (COZAAR) 50 MG tablet; Take 1 tablet (50 mg total) by mouth daily.  Dispense: 90 tablet; Refill: 1 ?- amLODipine (NORVASC) 5 MG tablet; Take 1 tablet (5 mg total) by mouth daily.  Dispense: 90 tablet; Refill: 1 ? ?4. Bilateral shoulder pain, unspecified chronicity ?- Ambulatory referral to Orthopedic Surgery ?- cyclobenzaprine (FLEXERIL) 10 MG tablet; Take 1 tablet (10 mg total) by mouth at bedtime. Prn pain  Dispense: 30 tablet; Refill: 0 ? ?5. Decreased range of motion of fingers of both hands ?R>L  due to fracture but she is having pain in her L hand  ?- Ambulatory referral to Orthopedic Surgery ?- cyclobenzaprine (FLEXERIL) 10 MG tablet; Take 1 tablet (10 mg total) by mouth at bedtime. Prn pain  Dispense: 30 tablet; Refill: 0 ? ? ? ?Patient have been counseled extensively about nutrition and exercise. Other issues discussed during this visit include: low cholesterol diet, weight control and daily exercise, foot care, annual eye examinations at Ophthalmology, importance of adherence with medications and regular follow-up. We also discussed long term complications of uncontrolled diabetes and hypertension.  ? ?Return in about 4 months (around 04/25/2022). ? ?The patient was given clear instructions to go to ER or return to medical center if symptoms don't improve, worsen or new problems  develop. The patient verbalized understanding. The patient was told to call to get lab results if they haven't heard anything in the next week.  ? ? ? ? ?Freeman Caldron, PA-C ?[place] ?[place], [place] ?[phone]   ?12/26/2021, 10:46 AM  ?

## 2022-01-05 ENCOUNTER — Ambulatory Visit: Payer: Medicaid Other | Admitting: Internal Medicine

## 2022-02-26 ENCOUNTER — Telehealth: Payer: Self-pay | Admitting: Internal Medicine

## 2022-02-26 DIAGNOSIS — I10 Essential (primary) hypertension: Secondary | ICD-10-CM

## 2022-02-26 NOTE — Telephone Encounter (Signed)
Pt is calling to report the insulin glargine (LANTUS SOLOSTAR) 100 UNIT/ML Solostar Pen [[id number]] was a $117. Is there a cheaper option that the pt can receive. Cb- [phone]

## 2022-02-26 NOTE — Telephone Encounter (Signed)
It looks like patient has an unmet $7800 deductible for her insurance. A copay card for Lantus may bring this down to a more manageable price. Will send information to [facility].   Claiborne Billings,   Are we able to apply a copay card for this patient?

## 2022-03-02 ENCOUNTER — Other Ambulatory Visit: Payer: Self-pay

## 2022-03-02 MED ORDER — BASAGLAR KWIKPEN 100 UNIT/ML ~~LOC~~ SOPN
24.0000 [IU] | PEN_INJECTOR | Freq: Every day | SUBCUTANEOUS | 0 refills | Status: DC
  Filled 2022-03-02: qty 6, 25d supply, fill #0
  Filled 2022-04-05: qty 6, 25d supply, fill #1

## 2022-03-02 NOTE — Addendum Note (Signed)
Addended by: Daisy Blossom, Annie Main L on: 03/02/2022 12:42 PM   Modules accepted: Orders

## 2022-03-02 NOTE — Telephone Encounter (Signed)
Rx sent for [place].

## 2022-03-03 ENCOUNTER — Other Ambulatory Visit: Payer: Self-pay

## 2022-03-24 ENCOUNTER — Telehealth: Payer: Self-pay | Admitting: Internal Medicine

## 2022-03-24 NOTE — Telephone Encounter (Signed)
Patient states she has been unsuccessful trying to obtain her Medical records for disability.   Informed patient of Medical records phone number [phone] , medical records fax # [phone] and medical records email address [email or web address].   Advised the patient to physically go to PCP office to fill out a medical records form.

## 2022-04-01 ENCOUNTER — Ambulatory Visit: Payer: Self-pay | Admitting: *Deleted

## 2022-04-01 NOTE — Telephone Encounter (Signed)
  Chief Complaint: request change in medication- wants to go back to previous insulin Symptoms: sinus symptoms Frequency:   Pertinent Negatives: Patient denies   Disposition: '[]'$ ED /'[]'$ Urgent Care (no appt availability in office) / '[]'$ Appointment(In office/virtual)/ '[]'$  [place] Virtual [facility]/ '[]'$ Home Care/ '[]'$ Refused Recommended Disposition /'[]'$ [place] [facility]/ '[]'$  Follow-up with PCP Additional Notes: Patient request change back to Solostar insulin pen and send to [facility]  Summary: Med refill/cold and runny nose   Pt is requesting a refill on insulin. Pt stated she doesn't feel like the medication Insulin Glargine (BASAGLAR KWIKPEN) 100 UNIT/ML is working for her. Pt mentioned that since she started this medication, she has had a cold and runny nose.   Pt stated she would rather have a refill on her old medication, insulin glargine (LANTUS SOLOSTAR) 100 UNIT/ML Solostar Pen.   Pt seeking clinical advice.     Reason for Disposition  [1] Caller has URGENT medicine question about med that PCP or specialist prescribed AND [2] triager unable to answer question  Answer Assessment - Initial Assessment Questions 1. NAME of MEDICINE: "What medicine(s) are you calling about?"     Basaglar kwikpen 2. QUESTION: "What is your question?" (e.g., double dose of medicine, side effect)     Sinus congestion- runny nose, congestion 3. PRESCRIBER: "Who prescribed the medicine?" Reason: if prescribed by specialist, call should be referred to that group.     PCP 4. SYMPTOMS: "Do you have any symptoms?" If Yes, ask: "What symptoms are you having?"  "How bad are the symptoms (e.g., mild, moderate, severe)     Sinus symptoms since the change 5. PREGNANCY:  "Is there any chance that you are pregnant?" "When was your last menstrual period?"       Patient states she prefers the insulin glargine( Solostar) to the new Rx. She is having SE and would like to change back. She also states she was getting  a bigger supply at [facility] for the same co pay she received less at [facility]. Please send Rx back to [facility]  Protocols used: Medication Question Call-A-AH

## 2022-04-02 NOTE — Telephone Encounter (Signed)
Pt is requesting to be changed back to Lantus insulin.

## 2022-04-05 ENCOUNTER — Other Ambulatory Visit: Payer: Self-pay

## 2022-04-06 NOTE — Telephone Encounter (Signed)
Pt stated she received a call yesterday regarding obtaining her medical records for disability. Pt stated she will be coming by to pick up today. Pt stated she spoke with someone from the office yesterday.

## 2022-04-08 NOTE — Telephone Encounter (Signed)
To get medical records, patient will need to call the [facility] medical records department at [phone]. Left number on  voicemail informing patient of instructions.

## 2022-04-09 ENCOUNTER — Other Ambulatory Visit: Payer: Self-pay

## 2022-05-03 ENCOUNTER — Encounter: Payer: Self-pay | Admitting: Internal Medicine

## 2022-05-03 ENCOUNTER — Ambulatory Visit: Payer: Commercial Managed Care - HMO | Attending: Internal Medicine | Admitting: Internal Medicine

## 2022-05-03 ENCOUNTER — Telehealth: Payer: Self-pay | Admitting: *Deleted

## 2022-05-03 VITALS — BP 150/95 | HR 74 | Temp 98.2°F | Ht 62.0 in | Wt 162.4 lb

## 2022-05-03 DIAGNOSIS — Z23 Encounter for immunization: Secondary | ICD-10-CM | POA: Diagnosis not present

## 2022-05-03 DIAGNOSIS — I152 Hypertension secondary to endocrine disorders: Secondary | ICD-10-CM

## 2022-05-03 DIAGNOSIS — E1159 Type 2 diabetes mellitus with other circulatory complications: Secondary | ICD-10-CM | POA: Diagnosis not present

## 2022-05-03 DIAGNOSIS — F17210 Nicotine dependence, cigarettes, uncomplicated: Secondary | ICD-10-CM

## 2022-05-03 DIAGNOSIS — Z2821 Immunization not carried out because of patient refusal: Secondary | ICD-10-CM

## 2022-05-03 DIAGNOSIS — F17200 Nicotine dependence, unspecified, uncomplicated: Secondary | ICD-10-CM

## 2022-05-03 DIAGNOSIS — M79641 Pain in right hand: Secondary | ICD-10-CM

## 2022-05-03 DIAGNOSIS — M25572 Pain in left ankle and joints of left foot: Secondary | ICD-10-CM

## 2022-05-03 DIAGNOSIS — E11319 Type 2 diabetes mellitus with unspecified diabetic retinopathy without macular edema: Secondary | ICD-10-CM

## 2022-05-03 LAB — POCT GLYCOSYLATED HEMOGLOBIN (HGB A1C): HbA1c, POC (controlled diabetic range): 7 % (ref 0.0–7.0)

## 2022-05-03 LAB — GLUCOSE, POCT (MANUAL RESULT ENTRY): POC Glucose: 96 mg/dl (ref 70–99)

## 2022-05-03 MED ORDER — LANTUS SOLOSTAR 100 UNIT/ML ~~LOC~~ SOPN: 10.0000 [IU] | PEN_INJECTOR | Freq: Every day | SUBCUTANEOUS | 99 refills | Status: DC

## 2022-05-03 NOTE — Telephone Encounter (Signed)
  Chief Complaint: Medication problem Symptoms: NA Frequency: NA Pertinent Negatives: Patient denies NA Disposition: '[]'$ ED /'[]'$ Urgent Care (no appt availability in office) / '[]'$ Appointment(In office/virtual)/ '[]'$  [place] Virtual Care/ '[]'$ Home Care/ '[]'$ Refused Recommended Disposition /'[]'$ [place] [facility]/ '[x]'$  Follow-up with PCP Additional Notes: 'Courtney' with [facility] calling. States order for Lantus notes "Approved brand name Lantus. " States insurance will not pay for Lantus, needs PA. Advise if would like to switch insulin. After hours call.

## 2022-05-03 NOTE — Progress Notes (Signed)
Patient ID: Tamara Barnes, female    DOB: 1965-09-28  MRN: [id number]  CC: Diabetes   Subjective: Tamara Barnes is a 56 y.o. female who presents for chronic ds management Her concerns today include:  Pt with hx of DM bilateral nonproliferative diabetic retinopathy,HTN,  tob dep, fibroids, vit D def.    C/o pain  of LT ankle.  Started after she accidentally hit outer part of LT ankle 2 wks ago against the wooden foot of her bed.  It was a little swollen at the time.  Used [employer] and Asper cream.  Helped some.  Pain has decreased but hurts when she puts pressure on the ankle.   DM: Results for orders placed or performed in visit on 05/03/22  POCT glucose (manual entry)  Result Value Ref Range   POC Glucose 96 70 - 99 mg/dl  POCT glycosylated hemoglobin (Hb A1C)  Result Value Ref Range   Hemoglobin A1C     HbA1c POC (<> result, manual entry)     HbA1c, POC (prediabetic range)     HbA1c, POC (controlled diabetic range) 7.0 0.0 - 7.0 %  A1C 7 Currently on Metformin 1 gram a.m and 500 mg in evening.  Reports she is tolerating better than in past when it was causing diarrhea. She was on 24 units daily of Glargine.  It was changed to [employer].  Did not tolerate the Basaglar so we go her approved for brand name Lantus -out of insulin x 3 wks Checks BS BID before meals.  Range 90-120 even with her be off insulin x 3 wks.  BS this a.m was 101 Doing okay with eating habits.  No sweet drinks.  Eating mainly soft foods because she needs to get several teeth fix that are loose. -She has history of nonproliferative diabetic retinopathy.  She is overdue for eye exam.  Denies any blurred vision.  HTN: Reports compliance with Cozaar 50 mg and Norvasc 5 mg daily.  She was a little late taking meds today.  Took at 2 p.m.  Usually takes in a.m Limits salt in foods Checks BP Q 1-3 days.  Last was 121/71.  Reports readings have been good  Complains of pain in the right hand ever since she  fractured the fourth and fifth fingers in December of last year.  She wore a cast for 7 weeks.  She gets intermittent pain and has to wear a brace at times.  Would like to go back to see [facility].  Still smoking but states she has cut back significantly and will be quitting soon.  She is down to 1 cigarette a day.  HM:  Declines flu shot.  Due for Shingrix.  Patient Active Problem List   Diagnosis Date Noted   Severe nonproliferative diabetic retinopathy of right eye ([place]) 08/14/2020   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 ([place]) 08/14/2020   Nuclear sclerotic cataract of both eyes 08/14/2020   Vitreomacular adhesion of both eyes 08/14/2020   Abdominal pain    E coli bacteremia    Pyelonephritis 04/07/2020   Sinusitis 07/04/2012   Elevated BP 07/04/2012   Rhinitis medicamentosa 07/04/2012   FIBROIDS, UTERUS 02/24/2009   UNSPECIFIED ABNORMAL MAMMOGRAM 02/24/2009   DYSFUNCTIONAL UTERINE BLEEDING 08/02/2008   ANEMIA 06/04/2008   SCIATICA 06/04/2008   TOBACCO DEPENDENCE 11/10/2006     Current Outpatient Medications on File Prior to Visit  Medication Sig Dispense Refill   amLODipine (NORVASC) 5 MG tablet Take 1 tablet (5  mg total) by mouth daily. 90 tablet 1   cyclobenzaprine (FLEXERIL) 10 MG tablet Take 1 tablet (10 mg total) by mouth at bedtime. Prn pain 30 tablet 0   gabapentin (NEURONTIN) 300 MG capsule Take 1 capsule (300 mg total) by mouth at bedtime. 90 capsule 3   HYDROcodone-acetaminophen (NORCO/VICODIN) 5-325 MG tablet Take 1 tablet by mouth every 6 (six) hours as needed for severe pain. (Patient not taking: Reported on 12/24/2021) 10 tablet 0   Insulin Pen Needle 32G X 4 MM MISC 1 each by Does not apply route in the morning and at bedtime. 100 each 3   losartan (COZAAR) 50 MG tablet Take 1 tablet (50 mg total) by mouth daily. 90 tablet 1   metFORMIN (GLUCOPHAGE) 1000 MG tablet Take 1 tablet ('1000mg'$ ) by mouth in the morning and 1/2 tablet ('500mg'$ ) by mouth  in the evening. 45 tablet 2   Vitamin D, Ergocalciferol, (DRISDOL) 1.25 MG (50000 UNIT) CAPS capsule Take 1 capsule (50,000 Units total) by mouth every 7 (seven) days. 16 capsule 0   No current facility-administered medications on file prior to visit.    Allergies  Allergen Reactions   Oatmeal Shortness Of Breath and Other (See Comments)    Tongue swells   Rice Shortness Of Breath, Swelling and Other (See Comments)    Tongue swells   Wheat Bran Shortness Of Breath, Swelling and Other (See Comments)    Tongue swells   Lisinopril Cough    Social History   Socioeconomic History   Marital status: Single    Spouse name: Not on file   Number of children: Not on file   Years of education: Not on file   Highest education level: Not on file  Occupational History   Not on file  Tobacco Use   Smoking status: Every Day    Packs/day: 0.25    Types: Cigarettes   Smokeless tobacco: Never  Vaping Use   Vaping Use: Never used  Substance and Sexual Activity   Alcohol use: Yes    Comment: socially   Drug use: No   Sexual activity: Not Currently    Birth control/protection: None  Other Topics Concern   Not on file  Social History Narrative   Not on file   Social Determinants of Health   Financial Resource Strain: Not on file  Food Insecurity: Not on file  Transportation Needs: No Transportation Needs (05/23/2020)   PRAPARE - Hydrologist (Medical): No    Lack of Transportation (Non-Medical): No  Physical Activity: Not on file  Stress: Not on file  Social Connections: Not on file  Intimate Partner Violence: Not on file    Family History  Problem Relation Age of Onset   Kidney failure Mother    Heart disease Mother    Liver disease Father    Stomach cancer Maternal Uncle 71   Colon cancer Neg Hx    Colon polyps Neg Hx    Esophageal cancer Neg Hx    Rectal cancer Neg Hx     Past Surgical History:  Procedure Laterality Date   FRACTURE  SURGERY     tuabl ligation     TUBAL LIGATION      ROS: Review of Systems Negative except as stated above  PHYSICAL EXAM: BP (!) 150/95   Pulse 74   Temp 98.2 F (36.8 C) (Oral)   Ht '5\' 2"'$  (1.575 m)   Wt 162 lb 6.4 oz (73.7 kg)  LMP 01/14/2015   SpO2 98%   BMI 29.70 kg/m   Wt Readings from Last 3 Encounters:  05/03/22 162 lb 6.4 oz (73.7 kg)  12/24/21 155 lb 12.8 oz (70.7 kg)  10/01/21 148 lb (67.1 kg)  Repeat blood pressure 160/90.  Physical Exam  General appearance - alert, well appearing, middle-age African-American female and in no distress Mental status - normal mood, behavior, speech, dress, motor activity, and thought processes Eyes - pupils equal and reactive, extraocular eye movements intact Chest - clear to auscultation, no wheezes, rales or rhonchi, symmetric air entry Heart - normal rate, regular rhythm, normal S1, S2, no murmurs, rubs, clicks or gallops Musculoskeletal -left foot: Slight soft tissue swelling over the lateral malleolus.  Mild tenderness on palpation over the malleolus.  Mild discomfort with passive range of motion. Right hand: Patient wearing a splint which was removed.  Mild flexion deformity of the fifth PIP joint.  Mild swelling of the fourth PIP joint. Extremities -no edema in the lower legs. Diabetic Foot Exam - Simple   Simple Foot Form Diabetic Foot exam was performed with the following findings: Yes 05/03/2022  5:17 PM  Visual Inspection No deformities, no ulcerations, no other skin breakdown bilaterally: Yes Sensation Testing Intact to touch and monofilament testing bilaterally: Yes Pulse Check Posterior Tibialis and Dorsalis pulse intact bilaterally: Yes Comments         Latest Ref Rng & Units 10/01/2021   11:04 AM 06/03/2021   11:17 AM 05/13/2021    9:44 AM  CMP  Glucose 70 - 99 mg/dL 127  133  168   BUN 6 - 24 mg/dL '16  19  12   '$ Creatinine 0.57 - 1.00 mg/dL 0.74  0.73  0.83   Sodium 134 - 144 mmol/L 145  142  140    Potassium 3.5 - 5.2 mmol/L 4.6  4.5  4.4   Chloride 96 - 106 mmol/L 106  103  104   CO2 20 - 29 mmol/L '25  25  21   '$ Calcium 8.7 - 10.2 mg/dL 10.3  9.9  9.6   Total Protein 6.0 - 8.5 g/dL 7.6   7.1   Total Bilirubin 0.0 - 1.2 mg/dL <0.2   <0.2   Alkaline Phos 44 - 121 IU/L 95   109   AST 0 - 40 IU/L 16   11   ALT 0 - 32 IU/L 13   8    Lipid Panel     Component Value Date/Time   CHOL 155 10/01/2021 1104   TRIG 174 (H) 10/01/2021 1104   HDL 65 10/01/2021 1104   CHOLHDL 2.4 10/01/2021 1104   CHOLHDL 2.7 Ratio 06/04/2008 2136   VLDL 45 (H) 06/04/2008 2136   LDLCALC 61 10/01/2021 1104    CBC    Component Value Date/Time   WBC 4.9 05/13/2021 0944   WBC 10.2 04/09/2020 1048   RBC 5.08 05/13/2021 0944   RBC 4.30 04/09/2020 1048   HGB 15.0 05/13/2021 0944   HCT 45.6 05/13/2021 0944   PLT 168 05/13/2021 0944   MCV 90 05/13/2021 0944   MCH 29.5 05/13/2021 0944   MCH 29.1 04/09/2020 1048   MCHC 32.9 05/13/2021 0944   MCHC 32.5 04/09/2020 1048   RDW 12.6 05/13/2021 0944   LYMPHSABS 1.8 05/13/2021 0944   EOSABS 0.0 05/13/2021 0944   BASOSABS 0.0 05/13/2021 0944    ASSESSMENT AND PLAN: 1. 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  Blood sugars are good considering she has been off insulin for 3 weeks.  We discussed having her continue with metformin 1 g in the morning and 500 mg in the evening.  I will send prescription for Lantus insulin to her pharmacy for her to use if blood sugars start to increase. - POCT glucose (manual entry) - POCT glycosylated hemoglobin (Hb A1C) - insulin glargine (LANTUS SOLOSTAR) 100 UNIT/ML Solostar Pen; Inject 10 Units into the skin at bedtime. Patient has been approved for brand name Lantus  Dispense: 15 mL; Refill: PRN - Ambulatory referral to Ophthalmology  2. Hypertension associated with  diabetes ([place]) Not at goal.  However patient reports good readings at home.  She thinks blood pressure is elevated today because she took her medicines late and she is also having some pain in the left ankle.  We agreed to continue current dose of Norvasc 5 mg and Cozaar 50 mg daily but have her follow-up with the clinical pharmacist in 2 weeks for repeat blood pressure check.  Advised to bring blood pressure readings with her.  DASH diet discussed and encouraged.  3. Tobacco dependence Strongly advised to quit.  She will work on putting the cigarettes down completely.  4. Acute left ankle pain Recommend warm compresses.  We will get an x-ray to rule out any hairline fracture. - DG Ankle Complete Left; Future  5. Pain of right hand - Ambulatory referral to Orthopedic Surgery  6. Need for shingles vaccine Patient agreeable to receiving first Shingrix vaccine today.  It was given.  7. Influenza vaccination declined Recommended.  Patient declined.\    Patient was given the opportunity to ask questions.  Patient verbalized understanding of the plan and was able to repeat key elements of the plan.   This documentation was completed using Radio [employer].  Any transcriptional errors are unintentional.  Orders Placed This Encounter  Procedures   DG Ankle Complete Left   Varicella-zoster vaccine IM   Ambulatory referral to Ophthalmology   Ambulatory referral to Orthopedic Surgery   POCT glucose (manual entry)   POCT glycosylated hemoglobin (Hb A1C)     Requested Prescriptions   Signed Prescriptions Disp Refills   insulin glargine (LANTUS SOLOSTAR) 100 UNIT/ML Solostar Pen 15 mL PRN    Sig: Inject 10 Units into the skin at bedtime. Patient has been approved for brand name Lantus    Return in about 4 months (around 09/02/2022) for Appt with [facility] in 2 wks for BP check.  [name], MD, FACP

## 2022-05-05 NOTE — Telephone Encounter (Signed)
Please advise.----DD,RMA

## 2022-05-05 NOTE — Telephone Encounter (Signed)
Tamara Barnes,   Are you able to assist? I am not sure what is going on as PA was approved last month for Lantus.

## 2022-05-06 ENCOUNTER — Other Ambulatory Visit: Payer: Self-pay

## 2022-05-07 ENCOUNTER — Other Ambulatory Visit: Payer: Self-pay

## 2022-05-07 NOTE — Telephone Encounter (Signed)
Patient's PA for Lantus approved. I called and made her pharmacy aware. I have also notified the patient.

## 2022-05-12 ENCOUNTER — Telehealth: Payer: Self-pay | Admitting: Internal Medicine

## 2022-05-12 ENCOUNTER — Other Ambulatory Visit: Payer: Self-pay

## 2022-05-12 ENCOUNTER — Other Ambulatory Visit: Payer: Self-pay | Admitting: Pharmacist

## 2022-05-12 DIAGNOSIS — E11319 Type 2 diabetes mellitus with unspecified diabetic retinopathy without macular edema: Secondary | ICD-10-CM

## 2022-05-12 MED ORDER — LANTUS SOLOSTAR 100 UNIT/ML ~~LOC~~ SOPN
10.0000 [IU] | PEN_INJECTOR | Freq: Every day | SUBCUTANEOUS | 99 refills | Status: DC
  Filled 2022-05-12: qty 3, 30d supply, fill #0

## 2022-05-12 NOTE — Telephone Encounter (Signed)
Copied from [place] [id number]. Topic: General - Other >> May 12, 2022  4:21 PM Tamara Barnes wrote: Pt requesting a cb from [facility] regarding insulin glargine   Please assist pt further

## 2022-05-12 NOTE — Telephone Encounter (Signed)
Pt states her out of pocket cost for insulin glargine (LANTUS SOLOSTAR) 100 UNIT/ML Solostar Pen is $150  Pt is requesting a PA  Please fu w/ pt w/ status update

## 2022-05-12 NOTE — Telephone Encounter (Signed)
Patient needs Lantus sent to our pharmacy. Rx sent to our pharmacy.

## 2022-05-12 NOTE — Telephone Encounter (Signed)
Copied from [place] [id number]. Topic: General - Other >> May 12, 2022  4:21 PM Leitha Schuller wrote: Pt requesting a cb from [facility] regarding insulin glargine   Please assist pt further

## 2022-05-12 NOTE — Telephone Encounter (Signed)
Rx sent for Lantus to our pharmacy.

## 2022-05-13 ENCOUNTER — Ambulatory Visit
Admission: RE | Admit: 2022-05-13 | Discharge: 2022-05-13 | Disposition: A | Discharge status: Home / Self Care | Payer: Commercial Managed Care - HMO | Source: Ambulatory Visit | Attending: Internal Medicine | Admitting: Internal Medicine

## 2022-05-13 ENCOUNTER — Other Ambulatory Visit: Payer: Self-pay

## 2022-05-13 DIAGNOSIS — M25572 Pain in left ankle and joints of left foot: Secondary | ICD-10-CM

## 2022-05-14 ENCOUNTER — Telehealth: Payer: Self-pay | Admitting: Internal Medicine

## 2022-05-14 NOTE — Telephone Encounter (Signed)
Phone call placed to patient this evening to go over the results of the x-ray of the left ankle.  I left a message informing of who I am and my reason for calling.  I will try to reach her again tomorrow.

## 2022-05-15 ENCOUNTER — Telehealth: Payer: Self-pay | Admitting: Internal Medicine

## 2022-05-15 DIAGNOSIS — M25572 Pain in left ankle and joints of left foot: Secondary | ICD-10-CM

## 2022-05-15 DIAGNOSIS — S82302A Unspecified fracture of lower end of left tibia, initial encounter for closed fracture: Secondary | ICD-10-CM

## 2022-05-15 NOTE — Telephone Encounter (Signed)
PC placed to pt today to go over results of x-ray done on LT ankle.  Pt informed that radiologist saw an irregular area in anterior aspect of tibia bone and small nondisplaced fx can not be ruled out.  I inquired whether she is still having issue with the ankle.  Pt states it is still a little sore when she walks on it.  I recommend referral to ortho to eval further.  Pt agreeable to this.

## 2022-05-20 ENCOUNTER — Ambulatory Visit: Payer: Commercial Managed Care - HMO | Admitting: Sports Medicine

## 2022-05-21 ENCOUNTER — Other Ambulatory Visit: Payer: Self-pay

## 2022-05-21 ENCOUNTER — Encounter: Payer: Self-pay | Admitting: Sports Medicine

## 2022-05-21 ENCOUNTER — Ambulatory Visit (INDEPENDENT_AMBULATORY_CARE_PROVIDER_SITE_OTHER): Payer: Commercial Managed Care - HMO | Admitting: Sports Medicine

## 2022-05-21 VITALS — BP 146/80 | HR 84 | Ht 64.0 in | Wt 158.6 lb

## 2022-05-21 DIAGNOSIS — S93402A Sprain of unspecified ligament of left ankle, initial encounter: Secondary | ICD-10-CM | POA: Diagnosis not present

## 2022-05-21 DIAGNOSIS — M25572 Pain in left ankle and joints of left foot: Secondary | ICD-10-CM | POA: Diagnosis not present

## 2022-05-21 MED ORDER — MELOXICAM 15 MG PO TABS
15.0000 mg | ORAL_TABLET | Freq: Every day | ORAL | 0 refills | Status: DC
  Filled 2022-05-21: qty 30, 30d supply, fill #0

## 2022-05-21 NOTE — Patient Instructions (Signed)
Tamara Barnes, it was great to see you today, thank you for letting me participate in your care.  Today, we discussed your ankle pain. I do not think you broke the ankle based on your x-rays. You definitely sprain some of the ligaments.  Things for you to do: -You will wear the boot during the day for the next 2 weeks, you can come out of this at nighttime into sleep -You ice the ankle for 20 minutes twice a day -Once at night before you ice and when the boot is off you will do the ankle and foot range of motion exercises -I will write you a prescription for meloxicam 15 mg, you will take 1 tablet once a day for pain  You will follow-up in 2 weeks.  If you have any further questions, please give the clinic a call [phone].  Elba Barman, DO Primary Care Sports Medicine Physician  [place]

## 2022-05-21 NOTE — Progress Notes (Signed)
A few weeks ago hit ankle on the bed. X-rays were obtained. Gabapentin at night for neuropathy, helps some with the pain. Was not given any type of boot or brace.

## 2022-05-21 NOTE — Progress Notes (Signed)
Tamara Barnes - 56 y.o. female MRN [id number]  Date of birth: 1966/06/03  Office Visit Note: Visit Date: 05/21/2022 PCP: Ladell Pier, MD Referred by: Ladell Pier, MD  Subjective: Chief Complaint  Patient presents with   Left Ankle - Pain   HPI: Tamara Barnes is a pleasant 56 y.o. female who presents today for left ankle pain.  A few weeks ago she hit the anterior lateral part of her ankle on the wooden support foot of the bed.  She did have a bruise over the lateral aspect of the ankle and had some swelling.  She thought that it would go away completely, although it has persisted so she saw her primary doctor on 05/03/2022 who ordered x-rays of the ankle.  She has been using [employer], Aspercreme which has helped to some extent.  She reports she had x-rays that had a concern for possible fracture so came to see orthopedics.  She is able to walk on it although there is still some pain on the lateral aspect of the ankle.  Denies any previous injury or fracture of the ankle, she does have history of issues with both of her hands.  Currently on gabapentin for neuropathy.  Pertinent ROS were reviewed with the patient and found to be negative unless otherwise specified above in HPI.   Assessment & Plan: Visit Diagnoses:  1. Pain in left ankle and joints of left foot   2. Sprain of left ankle, unspecified ligament, initial encounter    Plan: I independently reviewed her x-rays and discussed with Sanika that the cortical discontinuity that radiology was mentioning does not look like a fracture to me.  She is also not tender over this area of the tibia.  The majority of her pain and swelling is over the lateral ankle near the ATFL ligament.  Her injury and exam suggest a lateral ankle contusion over the bone and a lateral ankle sprain.  We will immobilize her for short period of time of 2 weeks in a cam walker boot.  After 1 week she is to come out of the boot twice daily for gentle  range of motion exercises and ankle pumps to prevent stiffness.  Recommended rest, ice, compression.  I did prescribe a course of meloxicam 15 mg for her to take for pain control and inflammation.  We will see her back in 2 weeks and hopefully transition her out of the boot to a lace up ankle brace.  If she is still having rather significant pain we can always consider advanced imaging such as an MRI of the ankle.  We will follow-up in 2 weeks, can return or call if issues arise sooner.  Follow-up: Return in about 2 weeks (around 06/04/2022).   Meds & Orders:  Meds ordered this encounter  Medications   meloxicam (MOBIC) 15 MG tablet    Sig: Take 1 tablet (15 mg total) by mouth daily.    Dispense:  30 tablet    Refill:  0   No orders of the defined types were placed in this encounter.    Procedures: No procedures performed      Clinical History: No specialty comments available.  She reports that she has been smoking cigarettes. She has been smoking an average of .25 packs per day. She has never used smokeless tobacco.  Recent Labs    10/01/21 1032 12/24/21 1623 05/03/22 1740  HGBA1C 6.5 6.6 7.0    Objective:   Vital Signs: BP Marland Kitchen)  146/80   Pulse 84   Ht '5\' 4"'$  (1.626 m)   Wt 158 lb 9.6 oz (71.9 kg)   LMP 01/14/2015   BMI 27.22 kg/m   Physical Exam  Gen: Well-appearing, in no acute distress; non-toxic CV: Regular Rate. Well-perfused. Warm.  Resp: Breathing unlabored on room air; no wheezing. Psych: Fluid speech in conversation; appropriate affect; normal thought process Neuro: Sensation intact throughout. No gross coordination deficits.   Ortho Exam -Evaluation of the left ankle and foot demonstrates a small bruise over the anterior aspect of the distal fibula.  There is some mild soft tissue swelling over the anterior lateral ankle.  Tenderness to palpation near the ATFL region.  There is no bony tenderness on the posterior aspect of the fibula.  Negative syndesmotic squeeze  test.  There is some pain with resisted inversion.  Neurovascular intact distally.  Imaging:  DG Ankle Complete Left CLINICAL DATA:  Status post trauma 3 weeks ago with persistent left ankle pain.  EXAM: LEFT ANKLE COMPLETE - 3+ VIEW  COMPARISON:  None Available.  FINDINGS: There is mild cortical discontinuity in the inferior anterior aspect of the tibia, a nondisplaced fracture is not excluded. There is a question bone island in the distal fibula. Small plantar calcaneal spur is identified. Osteophyte is noted extending from posterior aspect of the talus. Soft tissues are unremarkable.  IMPRESSION: Mild cortical discontinuity in the inferior anterior aspect of the tibia, a nondisplaced fracture is not excluded.  Electronically Signed   By: Abelardo Diesel M.D.   On: 05/14/2022 11:21    Past Medical/Family/Surgical/Social History: Medications & Allergies reviewed per EMR, new medications updated. Patient Active Problem List   Diagnosis Date Noted   Severe nonproliferative diabetic retinopathy of right eye ([place]) 08/14/2020   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 ([place]) 08/14/2020   Nuclear sclerotic cataract of both eyes 08/14/2020   Vitreomacular adhesion of both eyes 08/14/2020   Abdominal pain    E coli bacteremia    Pyelonephritis 04/07/2020   Sinusitis 07/04/2012   Elevated BP 07/04/2012   Rhinitis medicamentosa 07/04/2012   FIBROIDS, UTERUS 02/24/2009   UNSPECIFIED ABNORMAL MAMMOGRAM 02/24/2009   DYSFUNCTIONAL UTERINE BLEEDING 08/02/2008   ANEMIA 06/04/2008   SCIATICA 06/04/2008   TOBACCO DEPENDENCE 11/10/2006   Past Medical History:  Diagnosis Date   Diabetes mellitus without complication ([place])    Hypertension    Family History  Problem Relation Age of Onset   Kidney failure Mother    Heart disease Mother    Liver disease Father    Stomach cancer Maternal Uncle 61   Colon cancer Neg Hx    Colon polyps Neg Hx    Esophageal cancer  Neg Hx    Rectal cancer Neg Hx    Past Surgical History:  Procedure Laterality Date   FRACTURE SURGERY     tuabl ligation     TUBAL LIGATION     Social History   Occupational History   Not on file  Tobacco Use   Smoking status: Every Day    Packs/day: 0.25    Types: Cigarettes   Smokeless tobacco: Never  Vaping Use   Vaping Use: Never used  Substance and Sexual Activity   Alcohol use: Yes    Comment: socially   Drug use: No   Sexual activity: Not Currently    Birth control/protection: None

## 2022-05-24 ENCOUNTER — Ambulatory Visit: Payer: Commercial Managed Care - HMO | Attending: Internal Medicine | Admitting: Pharmacist

## 2022-05-24 ENCOUNTER — Other Ambulatory Visit: Payer: Self-pay | Admitting: Pharmacist

## 2022-05-24 ENCOUNTER — Encounter: Payer: Self-pay | Admitting: Pharmacist

## 2022-05-24 VITALS — BP 126/73 | HR 67

## 2022-05-24 DIAGNOSIS — E11319 Type 2 diabetes mellitus with unspecified diabetic retinopathy without macular edema: Secondary | ICD-10-CM | POA: Diagnosis not present

## 2022-05-24 DIAGNOSIS — I152 Hypertension secondary to endocrine disorders: Secondary | ICD-10-CM

## 2022-05-24 DIAGNOSIS — E1159 Type 2 diabetes mellitus with other circulatory complications: Secondary | ICD-10-CM | POA: Diagnosis not present

## 2022-05-24 MED ORDER — BLOOD PRESSURE KIT DEVI: 0 refills | Status: AC

## 2022-05-24 NOTE — Progress Notes (Signed)
   S:     No chief complaint on file.  Tamara Barnes is a 56 y.o. female who presents for hypertension evaluation, education, and management.  PMH is significant for T2DM w/ diabetic retinopathy in both eyes, HTN, tobacco dependency, fibroids, vit D def.   Patient was referred and last seen by Primary Care Provider, Dr. Wynetta Emery, on 05/03/2022.   At last visit, BP was 150/95 mmHg. However, she took her BP medications late before arriving to that appointment. No changes were made.    Today, patient arrives in good spirits and presents without assistance. Denies dizziness, headache, blurred vision, swelling.   Patient reports is longstanding.   Family/Social history:  -Fhx: kidney failure, heart disease -Tobacco: current 0.25 PPD smoker -Alcohol: none reported  Medication adherence reported. Patient has taken BP medications today.   Current antihypertensives include: Losartan 50 mg daily, Amlodipine 5 mg daily  Reported home BP readings: doesn't measure at home because her cuff broke  Patient reported dietary habits:  -Patient reports adherence to salt restrictions. -Caffeine: doesn't drink coffee or soda   Patient-reported exercise habits:  -Patient doesn't have a current exercising regimen due to ankle ligaments sprain.   O:  Vitals:   05/24/22 1438  BP: 126/73  Pulse: 67   Last 3 Office BP readings: BP Readings from Last 3 Encounters:  05/24/22 126/73  05/21/22 (!) 146/80  05/03/22 (!) 150/95   BMET    Component Value Date/Time   NA 145 (H) 10/01/2021 1104   K 4.6 10/01/2021 1104   CL 106 10/01/2021 1104   CO2 25 10/01/2021 1104   GLUCOSE 127 (H) 10/01/2021 1104   GLUCOSE 227 (H) 04/09/2020 1048   BUN 16 10/01/2021 1104   CREATININE 0.74 10/01/2021 1104   CALCIUM 10.3 (H) 10/01/2021 1104   GFRNONAA 107 05/01/2020 1550   GFRAA 123 05/01/2020 1550    Renal function: CrCl cannot be calculated (Patient's most recent lab result is older than the maximum 21  days allowed.).  Clinical ASCVD: No  The 10-year ASCVD risk score (Arnett DK, et al., 2019) is: 16.5%   Values used to calculate the score:     Age: 69 years     Sex: Female     Is Non-Hispanic African American: Yes     Diabetic: Yes     Tobacco smoker: Yes     Systolic Blood Pressure: 937 mmHg     Is BP treated: Yes     HDL Cholesterol: 65 mg/dL     Total Cholesterol: 155 mg/dL  A/P: Hypertension longstanding currently at goal on current medications. BP goal < 130/80 mmHg. Medication adherence appears optimal.  -Continued current regimen.  -Patient educated on purpose, proper use, and potential adverse effects of amlodipine, losartan.  -F/u labs ordered - BMP, urine mircoalbumin:Scr ratio  -Counseled on lifestyle modifications for blood pressure control including reduced dietary sodium, increased exercise, adequate sleep. -Encouraged patient to check BP at home and bring log of readings to next visit. Counseled on proper use of home BP cuff.    Results reviewed and written information provided.    Written patient instructions provided. Patient verbalized understanding of treatment plan.  Total time in face to face counseling 30 minutes.    Follow-up:  Pharmacist prn. PCP clinic visit in 09/02/2022.    Benard Halsted, PharmD, [place], [place] [phone]

## 2022-05-25 LAB — BASIC METABOLIC PANEL
BUN/Creatinine Ratio: 21 (ref 9–23)
BUN: 17 mg/dL (ref 6–24)
CO2: 23 mmol/L (ref 20–29)
Calcium: 9.4 mg/dL (ref 8.7–10.2)
Chloride: 108 mmol/L — ABNORMAL HIGH (ref 96–106)
Creatinine, Ser: 0.81 mg/dL (ref 0.57–1.00)
Glucose: 141 mg/dL — ABNORMAL HIGH (ref 70–99)
Potassium: 4.8 mmol/L (ref 3.5–5.2)
Sodium: 146 mmol/L — ABNORMAL HIGH (ref 134–144)
eGFR: 85 mL/min/{1.73_m2} (ref >59)

## 2022-05-25 LAB — MICROALBUMIN / CREATININE URINE RATIO
Creatinine, Urine: 148.4 mg/dL
Microalb/Creat Ratio: 9 mg/g creat (ref 0–29)
Microalbumin, Urine: 13.3 ug/mL

## 2022-06-04 ENCOUNTER — Encounter: Payer: Self-pay | Admitting: Sports Medicine

## 2022-06-04 ENCOUNTER — Ambulatory Visit (INDEPENDENT_AMBULATORY_CARE_PROVIDER_SITE_OTHER): Payer: Commercial Managed Care - HMO | Admitting: Sports Medicine

## 2022-06-04 DIAGNOSIS — M25572 Pain in left ankle and joints of left foot: Secondary | ICD-10-CM | POA: Diagnosis not present

## 2022-06-04 DIAGNOSIS — S93402A Sprain of unspecified ligament of left ankle, initial encounter: Secondary | ICD-10-CM | POA: Diagnosis not present

## 2022-06-04 NOTE — Progress Notes (Signed)
Tamara Barnes - 56 y.o. female MRN [id number]  Date of birth: 08-24-1966  Office Visit Note: Visit Date: 06/04/2022 PCP: Ladell Pier, MD Referred by: Ladell Pier, MD  Subjective: No chief complaint on file.  HPI: Tamara Barnes is a pleasant 56 y.o. female who presents today for follow-up of left ankle pain.  At her last visit on 05/21/2022 we did put her into a cam walker boot.  She has been adherent to this treatment regimen.  She states her ankle pain has significantly improved.  Only has very mild residual pain.  She has come out of this when walking around the house without much difficulty.  Swelling has greatly improved.  Denies any new injury.  She has been adherent to range of motion ankle exercises without increase in pain.  Pertinent ROS were reviewed with the patient and found to be negative unless otherwise specified above in HPI.   Assessment & Plan: Visit Diagnoses:  1. Sprain of left ankle, unspecified ligament, initial encounter   2. Pain in left ankle and joints of left foot    Plan: Discussed with Miss Butch Penny that she has shown good improvement in her ankle sprain, likely ATFL region.  Her anterior drawer suggest healing instability at this point.  We will transition her out of the cam walker boot today into a lace up ankle brace which she will continue with ambulation or activity for the next 2 weeks.  We did provide her a Thera-Band for her to perform strengthening range of motion exercises for the next 4 weeks.  Would expect her pain to completely resolve over the next 2-4 weeks, if it does not she will follow-up.  Otherwise follow-up as needed.  May use Tylenol for any residual pain or swelling.  Follow-up: PRN   Meds & Orders: No orders of the defined types were placed in this encounter.  No orders of the defined types were placed in this encounter.   Procedures: No procedures performed      Clinical History: No specialty comments available.   She reports that she has been smoking cigarettes. She has been smoking an average of .25 packs per day. She has never used smokeless tobacco.  Recent Labs    10/01/21 1032 12/24/21 1623 05/03/22 1740  HGBA1C 6.5 6.6 7.0    Objective:   Vital Signs: LMP 01/14/2015   Physical Exam  Gen: Well-appearing, in no acute distress; non-toxic CV: Regular Rate. Well-perfused. Warm.  Resp: Breathing unlabored on room air; no wheezing. Psych: Fluid speech in conversation; appropriate affect; normal thought process Neuro: Sensation intact throughout. No gross coordination deficits.   Ortho Exam -Left ankle: Very small residual healing superficial scar on the lateral malleolus.  Trace swelling over the ATFL region.  No bony TTP.  Full active range of motion about the ankle in all directions.  There is some pain with endrange inversion and plantarflexion of the ankle.  Negative anterior drawer test without laxity today.  Talar tilt with some pain but no instability.  She is able to put pressure and walk linearly without pain.  Neurovascular intact distally.  Imaging: No results found.  Past Medical/Family/Surgical/Social History: Medications & Allergies reviewed per EMR, new medications updated. 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 ([place]) 08/14/2020   Nuclear sclerotic cataract of both eyes 08/14/2020   Vitreomacular adhesion of both  eyes 08/14/2020   Abdominal pain    E coli bacteremia    Pyelonephritis 04/07/2020   Sinusitis 07/04/2012   Elevated BP 07/04/2012   Rhinitis medicamentosa 07/04/2012   FIBROIDS, UTERUS 02/24/2009   UNSPECIFIED ABNORMAL MAMMOGRAM 02/24/2009   DYSFUNCTIONAL UTERINE BLEEDING 08/02/2008   ANEMIA 06/04/2008   SCIATICA 06/04/2008   TOBACCO DEPENDENCE 11/10/2006   Past Medical History:  Diagnosis Date   Diabetes  mellitus without complication ([place])    Hypertension    Family History  Problem Relation Age of Onset   Kidney failure Mother    Heart disease Mother    Liver disease Father    Stomach cancer Maternal Uncle 61   Colon cancer Neg Hx    Colon polyps Neg Hx    Esophageal cancer Neg Hx    Rectal cancer Neg Hx    Past Surgical History:  Procedure Laterality Date   FRACTURE SURGERY     tuabl ligation     TUBAL LIGATION     Social History   Occupational History   Not on file  Tobacco Use   Smoking status: Every Day    Packs/day: 0.25    Types: Cigarettes   Smokeless tobacco: Never  Vaping Use   Vaping Use: Never used  Substance and Sexual Activity   Alcohol use: Yes    Comment: socially   Drug use: No   Sexual activity: Not Currently    Birth control/protection: None

## 2022-06-05 IMAGING — CR DG ELBOW COMPLETE 3+V*R*
4 series · 4 of 4 positions shown · non-contrast
Comparison: None.

CLINICAL DATA: Fall.

EXAM:
RIGHT ELBOW - COMPLETE 3+ VIEW

[elbow obl (1 of 2)]
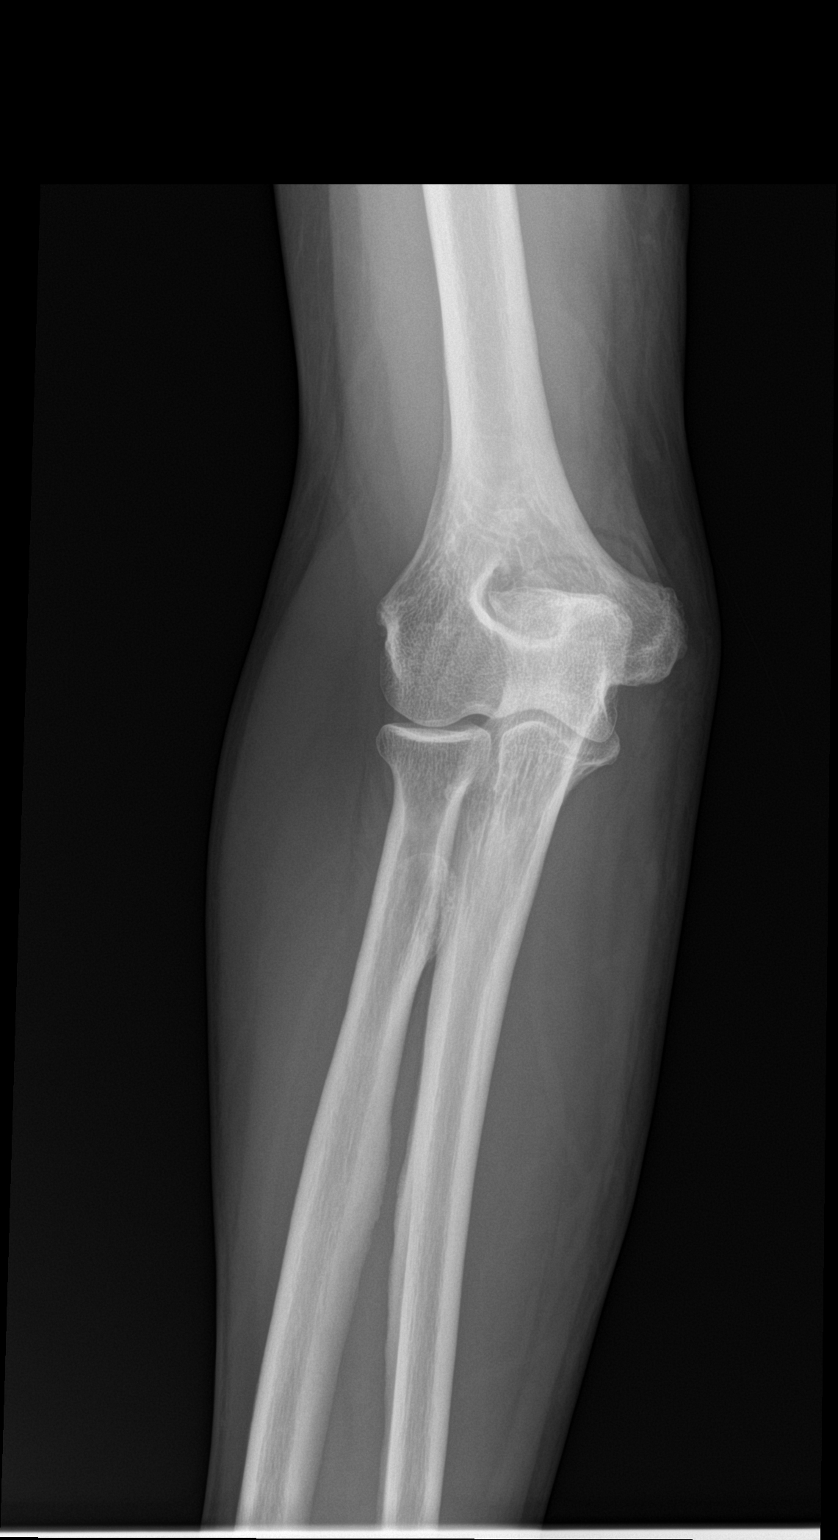

[elbow obl (2 of 2)]
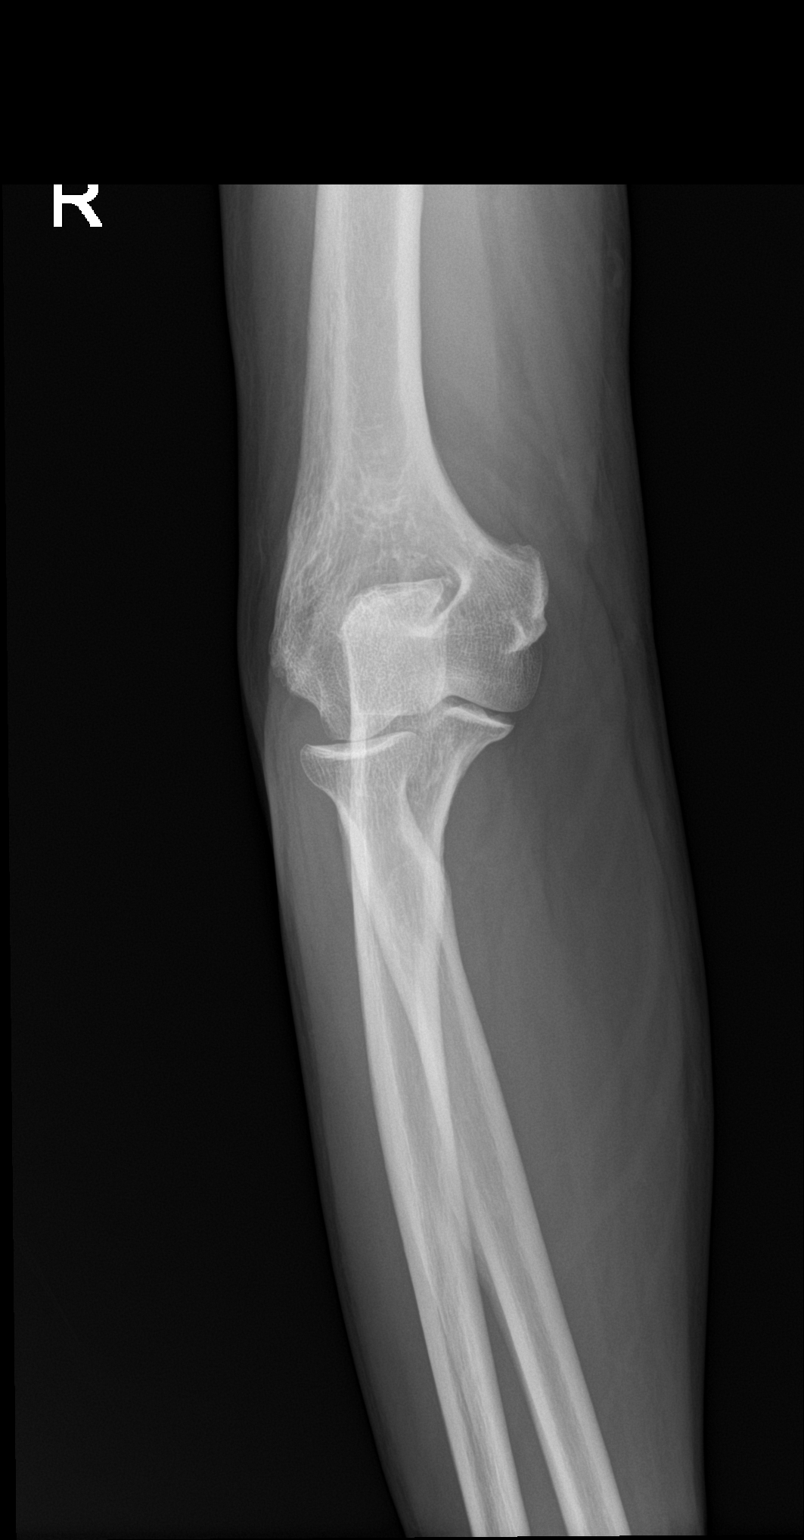

[elbow lat]
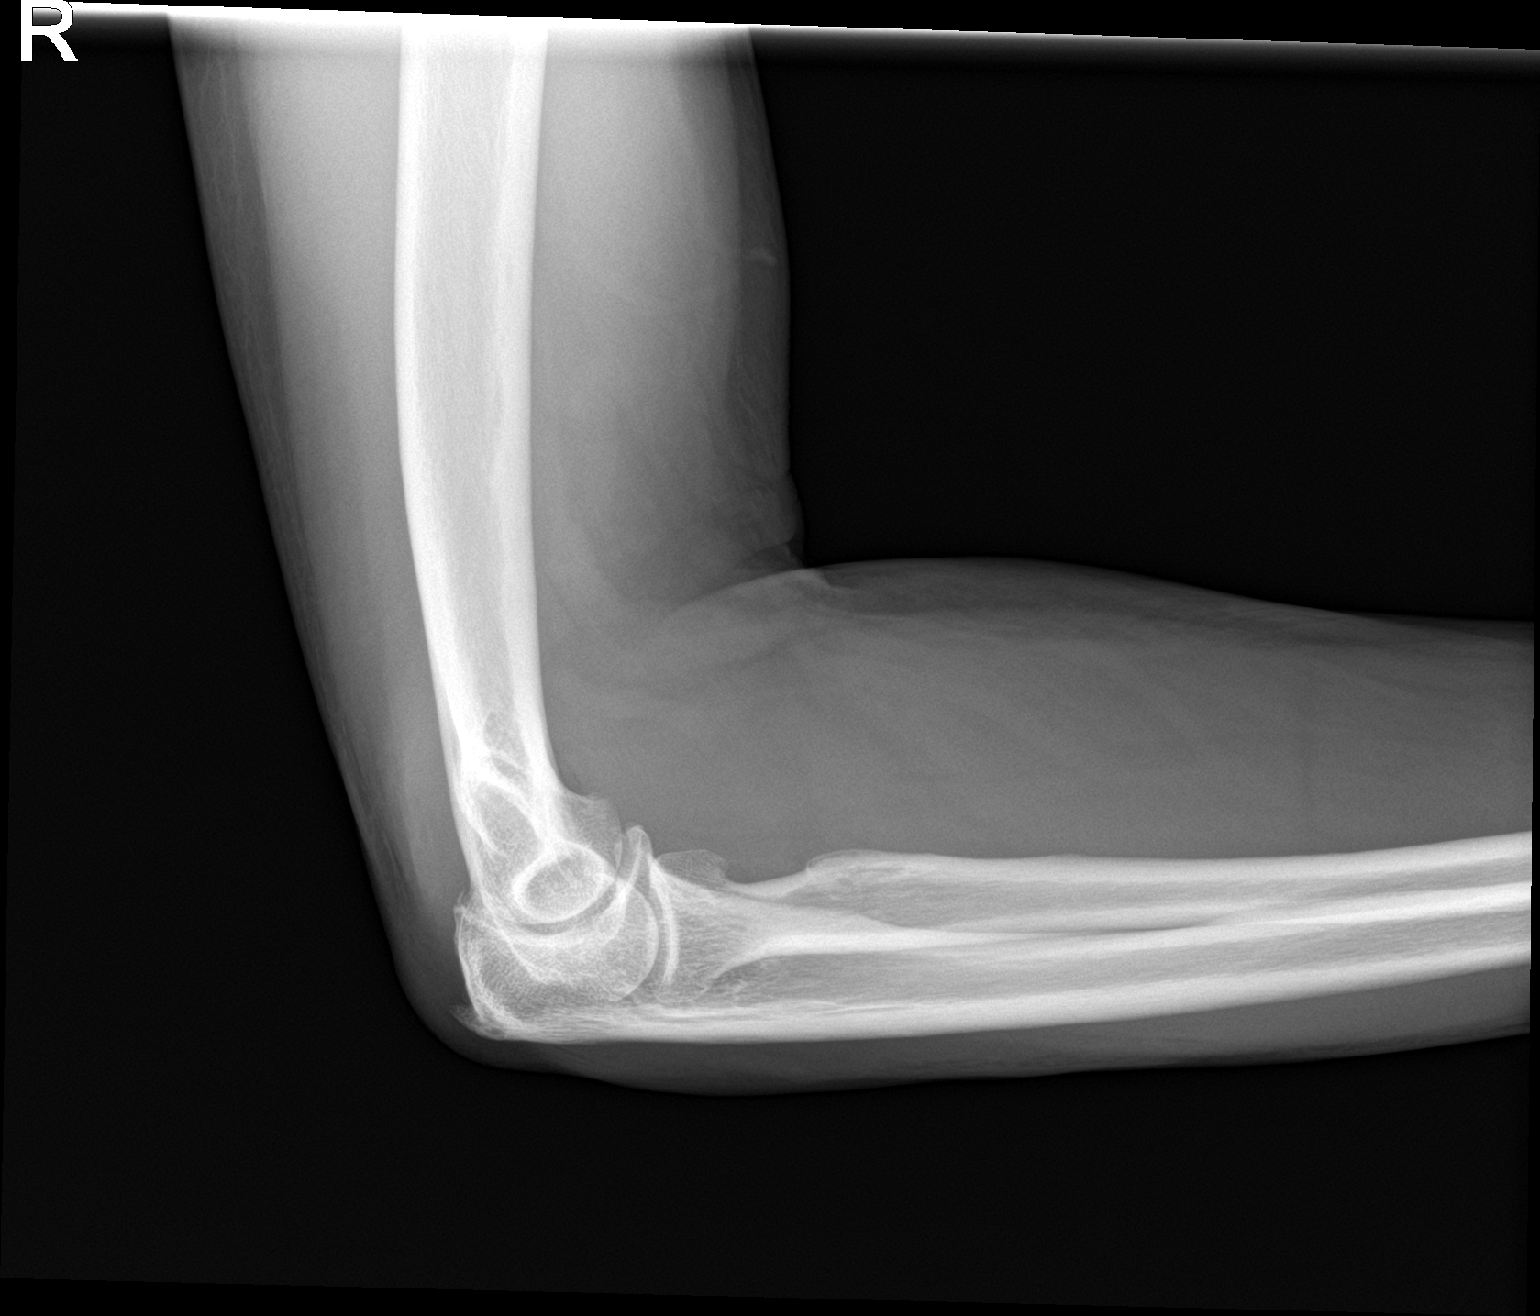

[elbow ap]
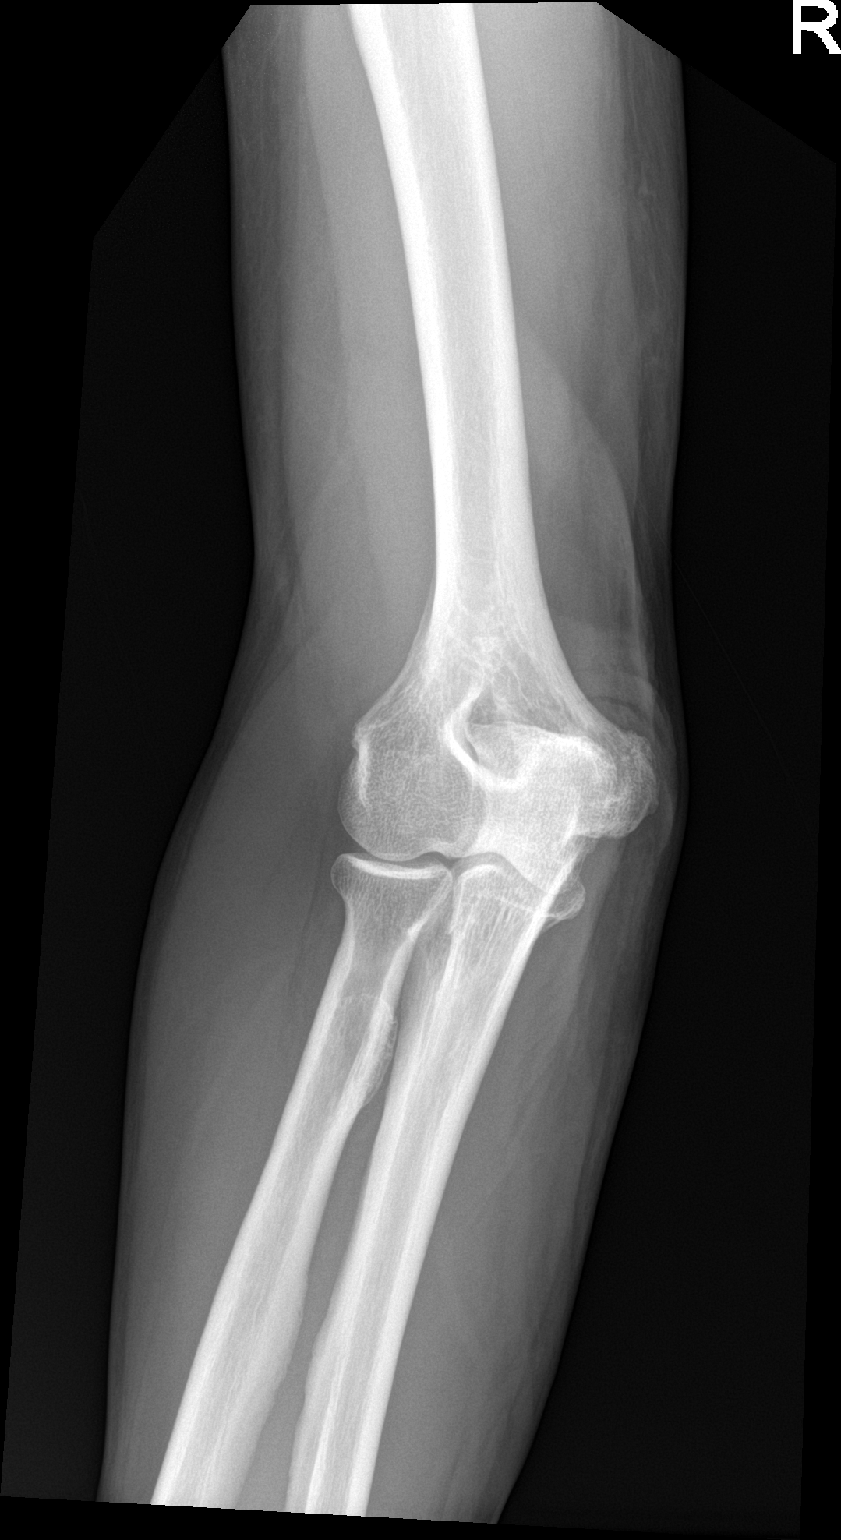

[4 of 4 positions shown; findings below may reference images not displayed]

FINDINGS: There is no acute fracture or dislocation. The bones are well
mineralized. No significant arthritic changes. No joint effusion.
The soft tissues are unremarkable
IMPRESSION: Negative.

## 2022-08-13 DIAGNOSIS — Z419 Encounter for procedure for purposes other than remedying health state, unspecified: Secondary | ICD-10-CM | POA: Diagnosis not present

## 2022-08-19 ENCOUNTER — Other Ambulatory Visit: Payer: Self-pay | Admitting: Internal Medicine

## 2022-08-19 DIAGNOSIS — E1165 Type 2 diabetes mellitus with hyperglycemia: Secondary | ICD-10-CM

## 2022-08-19 DIAGNOSIS — I10 Essential (primary) hypertension: Secondary | ICD-10-CM

## 2022-08-19 MED ORDER — LOSARTAN POTASSIUM 50 MG PO TABS: 50.0000 mg | ORAL_TABLET | Freq: Every day | ORAL | 1 refills | Status: DC

## 2022-08-19 MED ORDER — METFORMIN HCL 1000 MG PO TABS: ORAL_TABLET | ORAL | 2 refills | Status: DC

## 2022-08-19 MED ORDER — AMLODIPINE BESYLATE 5 MG PO TABS: 5.0000 mg | ORAL_TABLET | Freq: Every day | ORAL | 0 refills | Status: DC

## 2022-08-19 NOTE — Telephone Encounter (Signed)
Medication Refill - Medication: amLODipine (NORVASC) 5 MG tablet , losartan (COZAAR) 50 MG tablet , metFORMIN (GLUCOPHAGE) 1000 MG tablet   Has the patient contacted their pharmacy? Yes.    Preferred Pharmacy (with phone number or street name):  [facility] ([place]), [place] - [address] Phone: [phone]  Fax: [phone]     Has the patient been seen for an appointment in the last year OR does the patient have an upcoming appointment? Yes.    Please assist patient further

## 2022-08-19 NOTE — Telephone Encounter (Signed)
Requested Prescriptions  Pending Prescriptions Disp Refills   metFORMIN (GLUCOPHAGE-XR) 500 MG 24 hr tablet [Pharmacy Med Name: metFORMIN HCl ER 500 MG Oral Tablet Extended Release 24 Hour] 60 tablet 0    Sig: Take 1 tablet by mouth once daily with breakfast     Endocrinology:  Diabetes - Biguanides Failed - 08/19/2022 11:26 AM      Failed - B12 Level in normal range and within 720 days    No results found for: "VITAMINB12"       Failed - CBC within normal limits and completed in the last 12 months    WBC  Date Value Ref Range Status  05/13/2021 4.9 3.4 - 10.8 x10E3/uL Final  04/09/2020 10.2 4.0 - 10.5 K/uL Final   RBC  Date Value Ref Range Status  05/13/2021 5.08 3.77 - 5.28 x10E6/uL Final  04/09/2020 4.30 3.87 - 5.11 MIL/uL Final   Hemoglobin  Date Value Ref Range Status  05/13/2021 15.0 11.1 - 15.9 g/dL Final   Hematocrit  Date Value Ref Range Status  05/13/2021 45.6 34.0 - 46.6 % Final   MCHC  Date Value Ref Range Status  05/13/2021 32.9 31.5 - 35.7 g/dL Final  04/09/2020 32.5 30.0 - 36.0 g/dL Final   [facility]  Date Value Ref Range Status  05/13/2021 29.5 26.6 - 33.0 pg Final  04/09/2020 29.1 26.0 - 34.0 pg Final   MCV  Date Value Ref Range Status  05/13/2021 90 79 - 97 fL Final   No results found for: "PLTCOUNTKUC", "LABPLAT", "POCPLA" RDW  Date Value Ref Range Status  05/13/2021 12.6 11.7 - 15.4 % Final         Passed - Cr in normal range and within 360 days    Creatinine, Ser  Date Value Ref Range Status  05/24/2022 0.81 0.57 - 1.00 mg/dL Final         Passed - HBA1C is between 0 and 7.9 and within 180 days    HbA1c, POC (controlled diabetic range)  Date Value Ref Range Status  05/03/2022 7.0 0.0 - 7.0 % Final         Passed - eGFR in normal range and within 360 days    GFR calc Af Amer  Date Value Ref Range Status  05/01/2020 123 >59 mL/min/1.73 Final    Comment:    **[facility] currently reports eGFR in compliance with the current**    recommendations of the [employer]. [facility] will   update reporting as new guidelines are published from the NKF-ASN   Task force.    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, macular edema presence unspecified, unspecified retinopathy severity, unspecified whether long term insulin use [facility])   [place], MD   7 months ago Type 2 diabetes mellitus with hyperglycemia, unspecified whether long term insulin use [facility])   [place], [place], [place]   10 months  ago Type 2 diabetes mellitus with hyperglycemia, unspecified whether long term insulin use [facility])   [place], [place], [place]   1 year ago Hypertension, unspecified type   [place], RPH-CPP       Future Appointments             In 2 weeks Ladell Pier, MD [place]

## 2022-08-19 NOTE — Telephone Encounter (Signed)
Requested Prescriptions  Pending Prescriptions Disp Refills   amLODipine (NORVASC) 5 MG tablet 90 tablet 1    Sig: Take 1 tablet (5 mg total) by mouth daily.     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, macular edema presence unspecified, unspecified retinopathy severity, unspecified whether long term insulin use ([place])   [place], Neoma Laming B, MD   7 months ago Type 2 diabetes mellitus with hyperglycemia, unspecified whether long term insulin use [facility])   [place], [place], [place]   10 months ago Type 2 diabetes mellitus with hyperglycemia, unspecified whether long term insulin use [facility])   [place], [place], [place]   1 year ago Hypertension, unspecified type   [place], RPH-CPP       Future Appointments             In 2 weeks Ladell Pier, MD [place]             losartan (COZAAR) 50 MG tablet 90 tablet 1    Sig: Take 1 tablet (50 mg total) by mouth daily.     Cardiovascular:  Angiotensin Receptor Blockers Passed - 08/19/2022 12:06 PM      Passed - Cr in normal range and within 180 days    Creatinine, Ser  Date Value Ref Range Status  05/24/2022 0.81 0.57 - 1.00 mg/dL Final         Passed - K in normal range and within 180 days    Potassium   Date Value Ref Range Status  05/24/2022 4.8 3.5 - 5.2 mmol/L Final         Passed - Patient is not pregnant      Passed - Last BP in normal range    BP Readings from Last 1 Encounters:  05/24/22 126/73         Passed - Valid encounter within last 6 months    Recent Outpatient Visits           2 months ago Hypertension associated with diabetes [facility])   [place], Annie Main L, RPH-CPP   3 months ago 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, macular edema presence unspecified, unspecified retinopathy severity, unspecified whether long term insulin use ([place])   [place], MD   7 months ago Type 2 diabetes mellitus with hyperglycemia, unspecified whether long term insulin use [facility])   [place], [place], [place]   10 months ago Type 2 diabetes mellitus with hyperglycemia, unspecified whether long term insulin use ([place])  [place], [place], [place]   1 year ago Hypertension, unspecified type   [place], Stephen L, RPH-CPP       Future Appointments             In 2 weeks Ladell Pier, MD [place]             metFORMIN (GLUCOPHAGE) 1000 MG tablet 45 tablet 2    Sig: Take 1 tablet (1020m) by mouth in the morning and 1/2 tablet (5029m by mouth in the evening.     Endocrinology:  Diabetes - Biguanides Failed - 08/19/2022 12:06 PM      Failed - B12 Level in normal range and within 720 days    No results found for: "VITAMINB12"       Failed - CBC within normal limits and completed in the last 12 months    WBC  Date Value Ref Range Status  05/13/2021 4.9 3.4 - 10.8 x10E3/uL Final  04/09/2020 10.2 4.0 - 10.5 K/uL Final   RBC  Date Value Ref Range Status  05/13/2021 5.08 3.77 - 5.28 x10E6/uL Final  04/09/2020 4.30  3.87 - 5.11 MIL/uL Final   Hemoglobin  Date Value Ref Range Status  05/13/2021 15.0 11.1 - 15.9 g/dL Final   Hematocrit  Date Value Ref Range Status  05/13/2021 45.6 34.0 - 46.6 % Final   MCHC  Date Value Ref Range Status  05/13/2021 32.9 31.5 - 35.7 g/dL Final  04/09/2020 32.5 30.0 - 36.0 g/dL Final   [facility] Range Status  05/13/2021 29.5 26.6 - 33.0 pg Final  04/09/2020 29.1 26.0 - 34.0 pg Final   MCV  Date Value Ref Range Status  05/13/2021 90 79 - 97 fL Final   No results found for: "PLTCOUNTKUC", "LABPLAT", "POCPLA" RDW  Date Value Ref Range Status  05/13/2021 12.6 11.7 - 15.4 % Final         Passed - Cr in normal range and within 360 days    Creatinine, Ser  Date Value Ref Range Status  05/24/2022 0.81 0.57 - 1.00 mg/dL Final         Passed - HBA1C is between 0 and 7.9 and within 180 days    HbA1c, POC (controlled diabetic range)  Date Value Ref Range Status  05/03/2022 7.0 0.0 - 7.0 % Final         Passed - eGFR in normal range and within 360 days    GFR calc Af Amer  Date Value Ref Range Status  05/01/2020 123 >59 mL/min/1.73 Final    Comment:    **[facility] currently reports eGFR in compliance with the current**   recommendations of the [employer] will   update reporting as new guidelines are published from the NKF-ASN   Task force.    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,  macular edema presence unspecified, unspecified retinopathy severity, unspecified whether long term insulin use ([place])    [place], Neoma Laming B, MD   7 months ago Type 2 diabetes mellitus with hyperglycemia, unspecified whether long term insulin use [facility])   [place], [place], [place]   10 months ago Type 2 diabetes mellitus with hyperglycemia, unspecified whether long term insulin use [facility])   [place], [place], [place]   1 year ago Hypertension, unspecified type   [place], RPH-CPP       Future Appointments             In 2 weeks Ladell Pier, MD [place]

## 2022-09-02 ENCOUNTER — Ambulatory Visit: Payer: Commercial Managed Care - HMO | Attending: Internal Medicine | Admitting: Internal Medicine

## 2022-09-02 ENCOUNTER — Encounter: Payer: Self-pay | Admitting: Internal Medicine

## 2022-09-02 VITALS — BP 171/88 | HR 70 | Temp 98.0°F | Ht 62.0 in | Wt 160.8 lb

## 2022-09-02 DIAGNOSIS — Z23 Encounter for immunization: Secondary | ICD-10-CM | POA: Diagnosis not present

## 2022-09-02 DIAGNOSIS — M659 Synovitis and tenosynovitis, unspecified: Secondary | ICD-10-CM | POA: Diagnosis not present

## 2022-09-02 DIAGNOSIS — I10 Essential (primary) hypertension: Secondary | ICD-10-CM | POA: Diagnosis not present

## 2022-09-02 DIAGNOSIS — E11319 Type 2 diabetes mellitus with unspecified diabetic retinopathy without macular edema: Secondary | ICD-10-CM | POA: Diagnosis not present

## 2022-09-02 DIAGNOSIS — M79641 Pain in right hand: Secondary | ICD-10-CM

## 2022-09-02 DIAGNOSIS — M65949 Unspecified synovitis and tenosynovitis, unspecified hand: Secondary | ICD-10-CM

## 2022-09-02 LAB — POCT GLYCOSYLATED HEMOGLOBIN (HGB A1C): HbA1c, POC (controlled diabetic range): 7.7 % — AB (ref 0.0–7.0)

## 2022-09-02 MED ORDER — DAPAGLIFLOZIN PROPANEDIOL 5 MG PO TABS: 5.0000 mg | ORAL_TABLET | Freq: Every day | ORAL | 4 refills | Status: DC

## 2022-09-02 MED ORDER — MELOXICAM 15 MG PO TABS: 15.0000 mg | ORAL_TABLET | Freq: Every day | ORAL | 0 refills | Status: DC

## 2022-09-02 MED ORDER — AMLODIPINE BESYLATE 10 MG PO TABS: 10.0000 mg | ORAL_TABLET | Freq: Every day | ORAL | 6 refills | Status: DC

## 2022-09-02 NOTE — Progress Notes (Signed)
Pain in hands and shoulders.

## 2022-09-02 NOTE — Patient Instructions (Signed)
Your blood pressure is not at goal.  We have increased amlodipine to 10 mg daily.  Your diabetes is not at goal.  Continue current dose of metformin.  We have added the medication called Farxiga 5 mg daily.  This medication will make you urinate a little bit more.  Please stay hydrated by drinking several glasses of water daily.

## 2022-09-02 NOTE — Progress Notes (Signed)
Patient ID: Tamara Barnes, female    DOB: 1965-10-11  MRN: [id number]  CC: Diabetes   Subjective: Tamara Barnes is a 56 y.o. female who presents for chronic ds management Her concerns today include:  Pt with hx of DM bilateral nonproliferative diabetic retinopathy,HTN,  tob dep, fibroids, vit D def.     DM Results for orders placed or performed in visit on 09/02/22  POCT glycosylated hemoglobin (Hb A1C)  Result Value Ref Range   Hemoglobin A1C     HbA1c POC (<> result, manual entry)     HbA1c, POC (prediabetic range)     HbA1c, POC (controlled diabetic range) 7.7 (A) 0.0 - 7.0 %  On last visit, we left on metformin 1 g in the a.m. and 500 mg in the evening.  We gave the Lantus insulin for her to keep on hand and use only if blood sugars started increasing.  She states she last used Lantus about a month ago.  Checks blood sugars every other day.  Range has been 94-130.  A1c increased from last visit.  Feels she is doing okay with eating habits.  She tries to limit the sugary drinks. Eye exam scheduled for the 17th of next month.  She has history of retinopathy.  HTN: Reports compliance with Norvasc 5 mg daily and Cozaar 50 mg daily.  Took medications already this morning.  She limits salt in the food.  No chest pains or shortness of breath.  She saw sports medicine for her left ankle/foot pain.  Reports this has resolved.  Continues to complain of pain in the right hand mainly the fourth and fifth fingers which were fractured in September of last year.  She wore a cast for 7 weeks.  She is also started having some pain in the left thumb x 2 months.  She is wearing cloth splints on both wrists/hands.  She was referred to [facility] on last visit for her right hand.  They tried calling her unsuccessfully.  Patient states she was having issues with her phone at that time.  HM: Due for second Shingrix vaccine.  Patient Active Problem List   Diagnosis Date Noted   Severe  nonproliferative diabetic retinopathy of right eye ([place]) 08/14/2020   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 ([place]) 08/14/2020   Nuclear sclerotic cataract of both eyes 08/14/2020   Vitreomacular adhesion of both eyes 08/14/2020   Abdominal pain    E coli bacteremia    Pyelonephritis 04/07/2020   Sinusitis 07/04/2012   Elevated BP 07/04/2012   Rhinitis medicamentosa 07/04/2012   FIBROIDS, UTERUS 02/24/2009   UNSPECIFIED ABNORMAL MAMMOGRAM 02/24/2009   DYSFUNCTIONAL UTERINE BLEEDING 08/02/2008   ANEMIA 06/04/2008   SCIATICA 06/04/2008   TOBACCO DEPENDENCE 11/10/2006     Current Outpatient Medications on File Prior to Visit  Medication Sig Dispense Refill   Blood Pressure Monitoring (BLOOD PRESSURE KIT) DEVI Use to check blood pressure once daily. 1 each 0   cyclobenzaprine (FLEXERIL) 10 MG tablet Take 1 tablet (10 mg total) by mouth at bedtime. Prn pain 30 tablet 0   gabapentin (NEURONTIN) 300 MG capsule Take 1 capsule (300 mg total) by mouth at bedtime. 90 capsule 3   insulin glargine (LANTUS SOLOSTAR) 100 UNIT/ML Solostar Pen Inject 10 Units into the skin at bedtime. 15 mL PRN   Insulin Pen Needle 32G X 4 MM MISC 1 each by Does not apply route in the morning and at bedtime. 100 each 3   losartan (  COZAAR) 50 MG tablet Take 1 tablet (50 mg total) by mouth daily. 90 tablet 1   metFORMIN (GLUCOPHAGE) 1000 MG tablet Take 1 tablet (1021m) by mouth in the morning and 1/2 tablet (5021m by mouth in the evening. 45 tablet 2   Vitamin D, Ergocalciferol, (DRISDOL) 1.25 MG (50000 UNIT) CAPS capsule Take 1 capsule (50,000 Units total) by mouth every 7 (seven) days. 16 capsule 0   HYDROcodone-acetaminophen (NORCO/VICODIN) 5-325 MG tablet Take 1 tablet by mouth every 6 (six) hours as needed for severe pain. (Patient not taking: Reported on 12/24/2021) 10 tablet 0   No current facility-administered medications on file prior to visit.    Allergies  Allergen Reactions   Oatmeal  Shortness Of Breath and Other (See Comments)    Tongue swells   Rice Shortness Of Breath, Swelling and Other (See Comments)    Tongue swells   Wheat Bran Shortness Of Breath, Swelling and Other (See Comments)    Tongue swells   Lisinopril Cough    Social History   Socioeconomic History   Marital status: Single    Spouse name: Not on file   Number of children: Not on file   Years of education: Not on file   Highest education level: Not on file  Occupational History   Not on file  Tobacco Use   Smoking status: Every Day    Packs/day: 0.25    Types: Cigarettes   Smokeless tobacco: Never  Vaping Use   Vaping Use: Never used  Substance and Sexual Activity   Alcohol use: Yes    Comment: socially   Drug use: No   Sexual activity: Not Currently    Birth control/protection: None  Other Topics Concern   Not on file  Social History Narrative   Not on file   Social Determinants of Health   Financial Resource Strain: Not on file  Food Insecurity: Not on file  Transportation Needs: No Transportation Needs (05/23/2020)   PRAPARE - TrHydrologistMedical): No    Lack of Transportation (Non-Medical): No  Physical Activity: Not on file  Stress: Not on file  Social Connections: Not on file  Intimate Partner Violence: Not on file    Family History  Problem Relation Age of Onset   Kidney failure Mother    Heart disease Mother    Liver disease Father    Stomach cancer Maternal Uncle [id number] Colon cancer Neg Hx    Colon polyps Neg Hx    Esophageal cancer Neg Hx    Rectal cancer Neg Hx     Past Surgical History:  Procedure Laterality Date   FRACTURE SURGERY     tuabl ligation     TUBAL LIGATION      ROS: Review of Systems Negative except as stated above  PHYSICAL EXAM: BP (!) 171/88   Pulse 70   Temp 98 F (36.7 C) (Oral)   Ht _0  (1.575 m)   Wt 160 lb 12.8 oz (72.9 kg)   LMP 01/14/2015   SpO2 98%   BMI 29.41 kg/m   Physical  Exam  General appearance - alert, well appearing, middle-age African-American female and in no distress Mental status - normal mood, behavior, speech, dress, motor activity, and thought processes Neck - supple, no significant adenopathy Chest - clear to auscultation, no wheezes, rales or rhonchi, symmetric air entry Heart - normal rate, regular rhythm, normal S1, S2, no murmurs, rubs, clicks or gallops Extremities -  peripheral pulses normal, no pedal edema, no clubbing or cyanosis MSK: Mild to moderate tenderness on palpation of the base of the left thumb.  Moderate discomfort with passive movement of the thumb.  Right hand: Mild discomfort with passive range of motion at both PIP joints of the fourth and fifth fingers.     Latest Ref Rng & Units 05/24/2022    2:42 PM 10/01/2021   11:04 AM 06/03/2021   11:17 AM  CMP  Glucose 70 - 99 mg/dL 141  127  133   BUN 6 - 24 mg/dL _0 Creatinine 0.57 - 1.00 mg/dL 0.81  0.74  0.73   Sodium 134 - 144 mmol/L 146  145  142   Potassium 3.5 - 5.2 mmol/L 4.8  4.6  4.5   Chloride 96 - 106 mmol/L 108  106  103   CO2 20 - 29 mmol/L _1 Calcium 8.7 - 10.2 mg/dL 9.4  10.3  9.9   Total Protein 6.0 - 8.5 g/dL  7.6    Total Bilirubin 0.0 - 1.2 mg/dL  <0.2    Alkaline Phos 44 - 121 IU/L  95    AST 0 - 40 IU/L  16    ALT 0 - 32 IU/L  13     Lipid Panel     Component Value Date/Time   CHOL 155 10/01/2021 1104   TRIG 174 (H) 10/01/2021 1104   HDL 65 10/01/2021 1104   CHOLHDL 2.4 10/01/2021 1104   CHOLHDL 2.7 Ratio 06/04/2008 2136   VLDL 45 (H) 06/04/2008 2136   LDLCALC 61 10/01/2021 1104    CBC    Component Value Date/Time   WBC 4.9 05/13/2021 0944   WBC 10.2 04/09/2020 1048   RBC 5.08 05/13/2021 0944   RBC 4.30 04/09/2020 1048   HGB 15.0 05/13/2021 0944   HCT 45.6 05/13/2021 0944   PLT 168 05/13/2021 0944   MCV 90 05/13/2021 0944   MCH 29.5 05/13/2021 0944   MCH 29.1 04/09/2020 1048   MCHC 32.9 05/13/2021 0944   MCHC 32.5  04/09/2020 1048   RDW 12.6 05/13/2021 0944   LYMPHSABS 1.8 05/13/2021 0944   EOSABS 0.0 05/13/2021 0944   BASOSABS 0.0 05/13/2021 0944    ASSESSMENT AND PLAN:  1. 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  However patient states that she cannot tolerate the higher dose of metformin because it will cause diarrhea for her.  She wants to continue to use the Lantus only if needed.  We discussed adding Farxiga 5 mg daily.  She is agreeable to this.  Advised that the medication can cause more frequent urination.  Discussed the importance of drinking several glasses of fluid daily.  If she ever develop any viral illness that causes diarrhea or vomiting, she should stop the medication until symptoms subside. - POCT glycosylated hemoglobin (Hb A1C) - dapagliflozin propanediol (FARXIGA) 5 MG TABS tablet; Take 1 tablet (5 mg total) by mouth daily before breakfast.  Dispense: 30 tablet; Refill: 4 - CBC - Lipid panel - Hepatic function panel  2. Hypertension, unspecified type Not at goal.  Increase amlodipine to 10 mg daily.  Continue Cozaar 50 mg daily. - amLODipine (NORVASC) 10 MG tablet; Take 1 tablet (10 mg total) by mouth daily.  Dispense: 30  tablet; Refill: 6  3. Need for shingles vaccine Second Shingrix vaccine given today.  4. Tenosynovitis of thumb - meloxicam (MOBIC) 15 MG tablet; Take 1 tablet (15 mg total) by mouth daily.  Dispense: 30 tablet; Refill: 0 - Ambulatory referral to Orthopedic Surgery  5. Pain of right hand - meloxicam (MOBIC) 15 MG tablet; Take 1 tablet (15 mg total) by mouth daily.  Dispense: 30 tablet; Refill: 0 - Ambulatory referral to Orthopedic Surgery    Patient was given the opportunity to ask questions.  Patient verbalized understanding of the plan and was able to repeat key  elements of the plan.   This documentation was completed using Radio producer.  Any transcriptional errors are unintentional.  Orders Placed This Encounter  Procedures   Varicella-zoster vaccine IM   CBC   Lipid panel   Hepatic function panel   Ambulatory referral to Orthopedic Surgery   POCT glycosylated hemoglobin (Hb A1C)     Requested Prescriptions   Signed Prescriptions Disp Refills   amLODipine (NORVASC) 10 MG tablet 30 tablet 6    Sig: Take 1 tablet (10 mg total) by mouth daily.   meloxicam (MOBIC) 15 MG tablet 30 tablet 0    Sig: Take 1 tablet (15 mg total) by mouth daily.   dapagliflozin propanediol (FARXIGA) 5 MG TABS tablet 30 tablet 4    Sig: Take 1 tablet (5 mg total) by mouth daily before breakfast.    Return in about 4 months (around 01/02/2023).  [name], MD, FACP

## 2022-09-03 LAB — HEPATIC FUNCTION PANEL
ALT: 20 IU/L (ref 0–32)
AST: 15 IU/L (ref 0–40)
Albumin: 4.7 g/dL (ref 3.8–4.9)
Alkaline Phosphatase: 106 IU/L (ref 44–121)
Bilirubin Total: <0.2 mg/dL (ref 0.0–1.2)
Bilirubin, Direct: <0.1 mg/dL (ref 0.00–0.40)
Total Protein: 7.7 g/dL (ref 6.0–8.5)

## 2022-09-03 LAB — CBC
Hematocrit: 40.2 % (ref 34.0–46.6)
Hemoglobin: 13.4 g/dL (ref 11.1–15.9)
MCH: 29.3 pg (ref 26.6–33.0)
MCHC: 33.3 g/dL (ref 31.5–35.7)
MCV: 88 fL (ref 79–97)
Platelets: 194 10*3/uL (ref 150–450)
RBC: 4.58 x10E6/uL (ref 3.77–5.28)
RDW: 13.4 % (ref 11.7–15.4)
WBC: 5.4 10*3/uL (ref 3.4–10.8)

## 2022-09-03 LAB — LIPID PANEL
Chol/HDL Ratio: 2.3 ratio (ref 0.0–4.4)
Cholesterol, Total: 192 mg/dL (ref 100–199)
HDL: 82 mg/dL (ref >39)
LDL Chol Calc (NIH): 75 mg/dL (ref 0–99)
Triglycerides: 217 mg/dL — ABNORMAL HIGH (ref 0–149)
VLDL Cholesterol Cal: 35 mg/dL (ref 5–40)

## 2022-09-13 DIAGNOSIS — Z419 Encounter for procedure for purposes other than remedying health state, unspecified: Secondary | ICD-10-CM | POA: Diagnosis not present

## 2022-09-28 ENCOUNTER — Other Ambulatory Visit: Payer: Self-pay | Admitting: Internal Medicine

## 2022-09-28 DIAGNOSIS — M659 Synovitis and tenosynovitis, unspecified: Secondary | ICD-10-CM

## 2022-09-28 DIAGNOSIS — E1165 Type 2 diabetes mellitus with hyperglycemia: Secondary | ICD-10-CM

## 2022-09-28 DIAGNOSIS — M79641 Pain in right hand: Secondary | ICD-10-CM

## 2022-09-29 DIAGNOSIS — H5203 Hypermetropia, bilateral: Secondary | ICD-10-CM | POA: Diagnosis not present

## 2022-09-29 DIAGNOSIS — H2513 Age-related nuclear cataract, bilateral: Secondary | ICD-10-CM | POA: Diagnosis not present

## 2022-09-29 DIAGNOSIS — H40033 Anatomical narrow angle, bilateral: Secondary | ICD-10-CM | POA: Diagnosis not present

## 2022-09-29 DIAGNOSIS — H35033 Hypertensive retinopathy, bilateral: Secondary | ICD-10-CM | POA: Diagnosis not present

## 2022-09-29 DIAGNOSIS — H52223 Regular astigmatism, bilateral: Secondary | ICD-10-CM | POA: Diagnosis not present

## 2022-09-29 DIAGNOSIS — H524 Presbyopia: Secondary | ICD-10-CM | POA: Diagnosis not present

## 2022-09-29 DIAGNOSIS — H5213 Myopia, bilateral: Secondary | ICD-10-CM | POA: Diagnosis not present

## 2022-09-29 DIAGNOSIS — E113313 Type 2 diabetes mellitus with moderate nonproliferative diabetic retinopathy with macular edema, bilateral: Secondary | ICD-10-CM | POA: Diagnosis not present

## 2022-10-01 ENCOUNTER — Ambulatory Visit (INDEPENDENT_AMBULATORY_CARE_PROVIDER_SITE_OTHER): Payer: Commercial Managed Care - HMO | Admitting: Ophthalmology

## 2022-10-01 DIAGNOSIS — I10 Essential (primary) hypertension: Secondary | ICD-10-CM | POA: Diagnosis not present

## 2022-10-01 DIAGNOSIS — E113313 Type 2 diabetes mellitus with moderate nonproliferative diabetic retinopathy with macular edema, bilateral: Secondary | ICD-10-CM

## 2022-10-01 DIAGNOSIS — H35033 Hypertensive retinopathy, bilateral: Secondary | ICD-10-CM

## 2022-10-01 DIAGNOSIS — H25813 Combined forms of age-related cataract, bilateral: Secondary | ICD-10-CM

## 2022-10-01 DIAGNOSIS — E113411 Type 2 diabetes mellitus with severe nonproliferative diabetic retinopathy with macular edema, right eye: Secondary | ICD-10-CM

## 2022-10-01 NOTE — Progress Notes (Signed)
[facility] Note  10/01/2022     CHIEF COMPLAINT Patient presents for Retina Evaluation   HISTORY OF PRESENT ILLNESS: Tamara Barnes is a 57 y.o. female who presents to the clinic today for:   HPI     Retina Evaluation   In both eyes.  This started 1 day ago.  Duration of 1 day.  I, the attending physician,  performed the HPI with the patient and updated documentation appropriately.        Comments   Retina eval per Dr. Katy Fitch for DME she is reporting blurred vision and double vision she has noticed some floaters at times but denies any flashes of light       Last edited by Bernarda Caffey, MD on 10/02/2022  1:57 AM.    Pt is here on the referral of Shirleen Schirmer, PA-C for concern of DME, pt has previously seen Dr. Zadie Rhine (12.02.21), pt states she was supposed to see Dr. Zadie Rhine again in April but was hospitalized at that time for her diabetes, per pt her A1c was 7.0 last month, pt is on metformin and farxiga, she states her blood sugar is 130 and below, pt was dx as diabetic in 2020 when she was hospitalized and found out her blood sugar was 820, pt has neuropathy in her feet and is on gabapentin  Referring physician: Shirleen Schirmer, PA-C  [facility] [address] [place],  [place] [address]  HISTORICAL INFORMATION:   Selected notes from the MEDICAL RECORD NUMBER Referred by Shirleen Schirmer, PA-C for DME LEE:  Ocular Hx- PMH-    CURRENT MEDICATIONS: No current outpatient medications on file. (Ophthalmic Drugs)   No current facility-administered medications for this visit. (Ophthalmic Drugs)   Current Outpatient Medications (Other)  Medication Sig   amLODipine (NORVASC) 10 MG tablet Take 1 tablet (10 mg total) by mouth daily.   Blood Pressure Monitoring (BLOOD PRESSURE KIT) DEVI Use to check blood pressure once daily.   cyclobenzaprine (FLEXERIL) 10 MG tablet Take 1 tablet (10 mg total) by mouth at bedtime. Prn  pain   dapagliflozin propanediol (FARXIGA) 5 MG TABS tablet Take 1 tablet (5 mg total) by mouth daily before breakfast.   gabapentin (NEURONTIN) 300 MG capsule Take 1 capsule (300 mg total) by mouth at bedtime.   HYDROcodone-acetaminophen (NORCO/VICODIN) 5-325 MG tablet Take 1 tablet by mouth every 6 (six) hours as needed for severe pain. (Patient not taking: Reported on 12/24/2021)   insulin glargine (LANTUS SOLOSTAR) 100 UNIT/ML Solostar Pen Inject 10 Units into the skin at bedtime.   Insulin Pen Needle 32G X 4 MM MISC 1 each by Does not apply route in the morning and at bedtime.   losartan (COZAAR) 50 MG tablet Take 1 tablet (50 mg total) by mouth daily.   meloxicam (MOBIC) 15 MG tablet Take 1 tablet by mouth once daily   metFORMIN (GLUCOPHAGE) 1000 MG tablet Take 1 tablet (1075m) by mouth in the morning and 1/2 tablet (501m by mouth in the evening.   Vitamin D, Ergocalciferol, (DRISDOL) 1.25 MG (50000 UNIT) CAPS capsule Take 1 capsule (50,000 Units total) by mouth every 7 (seven) days.   No current facility-administered medications for this visit. (Other)   REVIEW OF SYSTEMS: ROS   Positive for: Eyes Last edited by ZaBernarda CaffeyMD on 10/02/2022  1:59 AM.     ALLERGIES Allergies  Allergen Reactions   Oatmeal Shortness Of Breath and Other (See Comments)  Tongue swells   Rice Shortness Of Breath, Swelling and Other (See Comments)    Tongue swells   Wheat Bran Shortness Of Breath, Swelling and Other (See Comments)    Tongue swells   Lisinopril Cough   PAST MEDICAL HISTORY Past Medical History:  Diagnosis Date   Diabetes mellitus without complication (HCC)    Hypertension    Past Surgical History:  Procedure Laterality Date   FRACTURE SURGERY     tuabl ligation     TUBAL LIGATION     FAMILY HISTORY Family History  Problem Relation Age of Onset   Kidney failure Mother    Heart disease Mother    Liver disease Father    Stomach cancer Maternal Uncle 32   Colon cancer  Neg Hx    Colon polyps Neg Hx    Esophageal cancer Neg Hx    Rectal cancer Neg Hx    SOCIAL HISTORY Social History   Tobacco Use   Smoking status: Every Day    Packs/day: 0.25    Types: Cigarettes   Smokeless tobacco: Never  Vaping Use   Vaping Use: Never used  Substance Use Topics   Alcohol use: Yes    Comment: socially   Drug use: No       OPHTHALMIC EXAM:  Base Eye Exam     Visual Acuity (Snellen - Linear)       Right Left   Dist cc 20/30 -3 20/30 -2   Dist ph cc 20/25 20/25 -2         Tonometry (Tonopen, 8:22 AM)       Right Left   Pressure 14 13         Pupils       Pupils Dark Light Shape React APD   Right PERRL 3 2 Round Brisk None   Left PERRL 3 2 Round Brisk None         Visual Fields       Left Right    Full Full         Neuro/Psych     Oriented x3: Yes   Mood/Affect: Normal         Dilation     Both eyes: 2.5% Phenylephrine @ 8:22 AM           Slit Lamp and Fundus Exam     Slit Lamp Exam       Right Left   Lids/Lashes Dermatochalasis - upper lid Dermatochalasis - upper lid   Conjunctiva/Sclera Melanosis, nasal pingeucula Melanosis, nasal pingeucula   Cornea arcus, 2+ Punctate epithelial erosions arcus, tear film debris, trace PEE   Anterior Chamber deep and clear deep and clear   Iris Round and dilated, No NVI Round and dilated, No NVI   Lens 2-3+ Nuclear sclerosis, 2+ Cortical cataract 2-3+ Nuclear sclerosis, 2+ Cortical cataract   Anterior Vitreous Mild syneresis Mild syneresis         Fundus Exam       Right Left   Disc Pink and Sharp Pink and Sharp   C/D Ratio 0.6 0.6   Macula Flat, Good foveal reflex, scattered MA/DBH greatest nasal mac, +cystic changes greatest nasal mac Flat, Good foveal reflex, scattered MA greatest nasal mac, trace cystic changes greatest nasal mac   Vessels attenuated, copper wiring, tortuosity, no NV attenuated, copper wiring, tortuosity, no NV   Periphery Attached, rare MA  Attached, minimal MA           IMAGING AND PROCEDURES  Imaging and Procedures for 10/01/2022  OCT, Retina - OU - Both Eyes       Right Eye Quality was good. Scan locations included subfoveal. Central Foveal Thickness: 297. Progression has no prior data. Findings include no SRF, abnormal foveal contour, intraretinal hyper-reflective material, intraretinal fluid, vitreomacular adhesion (+IRF/DME nasal and inferior fovea and macula).   Left Eye Quality was good. Scan locations included subfoveal. Central Foveal Thickness: 266. Progression has no prior data. Findings include normal foveal contour, no SRF, intraretinal hyper-reflective material, intraretinal fluid, vitreomacular adhesion (Mild cystic changes nasal macula).   Notes *Images captured and stored on drive  Diagnosis / Impression:  OD: +IRF/DME nasal and inferior fovea and macula OS: Mild cystic changes nasal macula  Clinical management:  See below  Abbreviations: NFP - Normal foveal profile. CME - cystoid macular edema. PED - pigment epithelial detachment. IRF - intraretinal fluid. SRF - subretinal fluid. EZ - ellipsoid zone. ERM - epiretinal membrane. ORA - outer retinal atrophy. ORT - outer retinal tubulation. SRHM - subretinal hyper-reflective material. IRHM - intraretinal hyper-reflective material            ASSESSMENT/PLAN:    ICD-10-CM   1. Moderate nonproliferative diabetic retinopathy of both eyes with macular edema associated with type 2 diabetes mellitus (HCC)  E11.3313 OCT, Retina - OU - Both Eyes    2. Essential hypertension  I10     3. Hypertensive retinopathy of both eyes  H35.033     4. Combined forms of age-related cataract of both eyes  H25.813      Moderate Non-proliferative diabetic retinopathy w/ DME OU - The incidence, risk factors for progression, natural history and treatment options for diabetic retinopathy were discussed with patient.   - The need for close monitoring of blood glucose,  blood pressure, and serum lipids, avoiding cigarette or any type of tobacco, and the need for long term follow up was also discussed with patient. - exam scattered MA OU - OCT shows DME/cystic changes OU (OD > OS) - The natural history, pathology, and characteristics of diabetic macular edema discussed with patient.  A generalized discussion of the major clinical trials concerning treatment of diabetic macular edema (ETDRS, DCT, SCORE, RISE / RIDE, and ongoing DRCR net studies) was completed.  This discussion included mention of the various approaches to treating diabetic macular edema (observation, laser photocoagulation, anti-VEGF injections with lucentis / Avastin / Eylea, steroid injections with Kenalog / Ozurdex, and intraocular surgery with vitrectomy).  The goal hemoglobin A1C of 6-7 was discussed, as well as importance of smoking cessation and hypertension control.  Need for ongoing treatment and monitoring were specifically discussed with reference to chronic nature of diabetic macular edema. - pt report improved control of BG - no treatment recommended today -- will monitor closely - f/u in 6 wks -- DFE/OCT, possible injection  2,3. Hypertensive retinopathy OU - discussed importance of tight BP control - monitor  4. Mixed Cataract OU - The symptoms of cataract, surgical options, and treatments and risks were discussed with patient. - discussed diagnosis and progression  - under the expert management of [place] Ordered this visit:  No orders of the defined types were placed in this encounter.    Return in about 6 weeks (around 11/12/2022) for NPDR w/ DME OU, DFE, OCT.  There are no Patient Instructions on file for this visit.   Explained the diagnoses, plan, and follow up with the patient and they expressed understanding.  Patient  expressed understanding of the importance of proper follow up care.   This document serves as a record of services personally  performed by Gardiner Sleeper, MD, PhD. It was created on their behalf by San Jetty. Owens Shark, OA an ophthalmic technician. The creation of this record is the provider's dictation and/or activities during the visit.    Electronically signed by: San Jetty. Owens Shark, [place] 01.19.2024 2:02 AM  Gardiner Sleeper, M.D., Ph.D. Diseases & Surgery of the Retina and Vitreous [facility]  I have reviewed the above documentation for accuracy and completeness, and I agree with the above. Gardiner Sleeper, M.D., Ph.D. 10/02/22 2:06 AM   Abbreviations: M myopia (nearsighted); A astigmatism; H hyperopia (farsighted); P presbyopia; Mrx spectacle prescription;  CTL contact lenses; OD right eye; OS left eye; OU both eyes  XT exotropia; ET esotropia; PEK punctate epithelial keratitis; PEE punctate epithelial erosions; DES dry eye syndrome; MGD meibomian gland dysfunction; ATs artificial tears; PFAT's preservative free artificial tears; [place] nuclear sclerotic cataract; PSC posterior subcapsular cataract; ERM epi-retinal membrane; PVD posterior vitreous detachment; RD retinal detachment; DM diabetes mellitus; DR diabetic retinopathy; NPDR non-proliferative diabetic retinopathy; PDR proliferative diabetic retinopathy; CSME clinically significant macular edema; DME diabetic macular edema; dbh dot blot hemorrhages; CWS cotton wool spot; POAG primary open angle glaucoma; C/D cup-to-disc ratio; HVF humphrey visual field; GVF goldmann visual field; OCT optical coherence tomography; IOP intraocular pressure; BRVO Branch retinal vein occlusion; CRVO central retinal vein occlusion; CRAO central retinal artery occlusion; BRAO branch retinal artery occlusion; RT retinal tear; SB scleral buckle; PPV pars plana vitrectomy; VH Vitreous hemorrhage; PRP panretinal laser photocoagulation; IVK intravitreal kenalog; VMT vitreomacular traction; MH Macular hole;  NVD neovascularization of the disc; NVE neovascularization elsewhere; AREDS  age related eye disease study; ARMD age related macular degeneration; POAG primary open angle glaucoma; EBMD epithelial/anterior basement membrane dystrophy; ACIOL anterior chamber intraocular lens; IOL intraocular lens; PCIOL posterior chamber intraocular lens; Phaco/IOL phacoemulsification with intraocular lens placement; [place] photorefractive keratectomy; LASIK laser assisted in situ keratomileusis; HTN hypertension; DM diabetes mellitus; COPD chronic obstructive pulmonary disease

## 2022-10-02 ENCOUNTER — Encounter (INDEPENDENT_AMBULATORY_CARE_PROVIDER_SITE_OTHER): Payer: Self-pay | Admitting: Ophthalmology

## 2022-10-03 ENCOUNTER — Other Ambulatory Visit: Payer: Self-pay | Admitting: Internal Medicine

## 2022-10-03 DIAGNOSIS — E1165 Type 2 diabetes mellitus with hyperglycemia: Secondary | ICD-10-CM

## 2022-10-05 NOTE — Telephone Encounter (Signed)
Dosage changed. Refilled 08/19/2022 3 month supply. Requested Prescriptions  Pending Prescriptions Disp Refills   metFORMIN (GLUCOPHAGE-XR) 500 MG 24 hr tablet [Pharmacy Med Name: metFORMIN HCl ER 500 MG Oral Tablet Extended Release 24 Hour] 60 tablet 0    Sig: Take 1 tablet by mouth once daily with breakfast     Endocrinology:  Diabetes - Biguanides Failed - 10/03/2022  4:03 PM      Failed - B12 Level in normal range and within 720 days    No results found for: "VITAMINB12"       Failed - CBC within normal limits and completed in the last 12 months    WBC  Date Value Ref Range Status  09/02/2022 5.4 3.4 - 10.8 x10E3/uL Final  04/09/2020 10.2 4.0 - 10.5 K/uL Final   RBC  Date Value Ref Range Status  09/02/2022 4.58 3.77 - 5.28 x10E6/uL Final  04/09/2020 4.30 3.87 - 5.11 MIL/uL Final   Hemoglobin  Date Value Ref Range Status  09/02/2022 13.4 11.1 - 15.9 g/dL Final   Hematocrit  Date Value Ref Range Status  09/02/2022 40.2 34.0 - 46.6 % Final   MCHC  Date Value Ref Range Status  09/02/2022 33.3 31.5 - 35.7 g/dL Final  04/09/2020 32.5 30.0 - 36.0 g/dL Final   [facility]  Date Value Ref Range Status  09/02/2022 29.3 26.6 - 33.0 pg Final  04/09/2020 29.1 26.0 - 34.0 pg Final   MCV  Date Value Ref Range Status  09/02/2022 88 79 - 97 fL Final   No results found for: "PLTCOUNTKUC", "LABPLAT", "POCPLA" RDW  Date Value Ref Range Status  09/02/2022 13.4 11.7 - 15.4 % Final         Passed - Cr in normal range and within 360 days    Creatinine, Ser  Date Value Ref Range Status  05/24/2022 0.81 0.57 - 1.00 mg/dL Final         Passed - HBA1C is between 0 and 7.9 and within 180 days    HbA1c, POC (controlled diabetic range)  Date Value Ref Range Status  09/02/2022 7.7 (A) 0.0 - 7.0 % Final         Passed - eGFR in normal range and within 360 days    GFR calc Af Amer  Date Value Ref Range Status  05/01/2020 123 >59 mL/min/1.73 Final    Comment:    **[facility] currently reports  eGFR in compliance with the current**   recommendations of the [employer]. [facility] will   update reporting as new guidelines are published from the NKF-ASN   Task force.    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)   [place] [name] B, MD   9 months ago Type 2 diabetes mellitus with hyperglycemia, unspecified whether long term insulin use [facility])   [place] Lula, [place], [place]   1 year ago Type 2 diabetes mellitus with hyperglycemia, unspecified whether long term insulin use [facility])   [place], Dionne Bucy, [place]       Future Appointments             In 3 months Wynetta Emery, Dalbert Batman, MD [place]

## 2022-10-08 DIAGNOSIS — M25511 Pain in right shoulder: Secondary | ICD-10-CM | POA: Diagnosis not present

## 2022-10-08 DIAGNOSIS — M1812 Unilateral primary osteoarthritis of first carpometacarpal joint, left hand: Secondary | ICD-10-CM | POA: Diagnosis not present

## 2022-10-08 DIAGNOSIS — S62346A Nondisplaced fracture of base of fifth metacarpal bone, right hand, initial encounter for closed fracture: Secondary | ICD-10-CM | POA: Diagnosis not present

## 2022-10-14 DIAGNOSIS — Z419 Encounter for procedure for purposes other than remedying health state, unspecified: Secondary | ICD-10-CM | POA: Diagnosis not present

## 2022-10-20 ENCOUNTER — Telehealth: Payer: Self-pay

## 2022-10-20 NOTE — Telephone Encounter (Signed)
Prior authorization submitted for Tamara Barnes to ins today via CoverMyMeds Key: [id number]

## 2022-10-21 ENCOUNTER — Other Ambulatory Visit: Payer: Self-pay | Admitting: Pharmacist

## 2022-10-21 MED ORDER — EMPAGLIFLOZIN 10 MG PO TABS: 10.0000 mg | ORAL_TABLET | Freq: Every day | ORAL | 3 refills | Status: DC

## 2022-11-05 DIAGNOSIS — M1812 Unilateral primary osteoarthritis of first carpometacarpal joint, left hand: Secondary | ICD-10-CM | POA: Diagnosis not present

## 2022-11-10 DIAGNOSIS — H524 Presbyopia: Secondary | ICD-10-CM | POA: Diagnosis not present

## 2022-11-11 NOTE — Progress Notes (Signed)
[facility]  11/12/2022     CHIEF COMPLAINT Patient presents for Retina Follow Up   HISTORY OF PRESENT ILLNESS: Tamara Barnes is a 57 y.o. female who presents to the clinic today for:   HPI     Retina Follow Up   Patient presents with  Diabetic Retinopathy.  In both eyes.  This started 6 weeks ago.  Duration of 6 weeks.  Since onset it is stable.  I, the attending physician,  performed the HPI with the patient and updated documentation appropriately.        Comments   6 week retina follow up and ? Injection pt is reporting no vision changes noticed her last glucoses reading was 7.5 A1C       Last edited by Bernarda Caffey, MD on 11/14/2022 11:41 PM.    Pt states she has gotten new glasses and her vision has improved with them, she is taking metformin and insulin   Referring physician: Shirleen Schirmer, PA-C  [facility] [address],  [place] [address]  HISTORICAL INFORMATION:   Selected notes from the MEDICAL RECORD NUMBER Referred by Shirleen Schirmer, PA-C for DME LEE:  Ocular Hx- PMH-    CURRENT MEDICATIONS: No current outpatient medications on file. (Ophthalmic Drugs)   No current facility-administered medications for this visit. (Ophthalmic Drugs)   Current Outpatient Medications (Other)  Medication Sig   amLODipine (NORVASC) 10 MG tablet Take 1 tablet (10 mg total) by mouth daily.   Blood Pressure Monitoring (BLOOD PRESSURE KIT) DEVI Use to check blood pressure once daily.   cyclobenzaprine (FLEXERIL) 10 MG tablet Take 1 tablet (10 mg total) by mouth at bedtime. Prn pain   empagliflozin (JARDIANCE) 10 MG TABS tablet Take 1 tablet (10 mg total) by mouth daily before breakfast.   gabapentin (NEURONTIN) 300 MG capsule Take 1 capsule (300 mg total) by mouth at bedtime.   HYDROcodone-acetaminophen (NORCO/VICODIN) 5-325 MG tablet Take 1 tablet by mouth every 6 (six) hours as needed for severe pain.  (Patient not taking: Reported on 12/24/2021)   insulin glargine (LANTUS SOLOSTAR) 100 UNIT/ML Solostar Pen Inject 10 Units into the skin at bedtime.   Insulin Pen Needle 32G X 4 MM MISC 1 each by Does not apply route in the morning and at bedtime.   losartan (COZAAR) 50 MG tablet Take 1 tablet (50 mg total) by mouth daily.   meloxicam (MOBIC) 15 MG tablet Take 1 tablet by mouth once daily   metFORMIN (GLUCOPHAGE) 1000 MG tablet Take 1 tablet ('1000mg'$ ) by mouth in the morning and 1/2 tablet ('500mg'$ ) by mouth in the evening.   Vitamin D, Ergocalciferol, (DRISDOL) 1.25 MG (50000 UNIT) CAPS capsule Take 1 capsule (50,000 Units total) by mouth every 7 (seven) days.   No current facility-administered medications for this visit. (Other)   REVIEW OF SYSTEMS: ROS   Positive for: Endocrine, Eyes Last edited by Parthenia Ames, COT on 11/12/2022  8:50 AM.      ALLERGIES Allergies  Allergen Reactions   Oatmeal Shortness Of Breath and Other (See Comments)    Tongue swells   Rice Shortness Of Breath, Swelling and Other (See Comments)    Tongue swells   Wheat Bran Shortness Of Breath, Swelling and Other (See Comments)    Tongue swells   Lisinopril Cough   PAST MEDICAL HISTORY Past Medical History:  Diagnosis Date   Diabetes mellitus without complication ([place])  Hypertension    Past Surgical History:  Procedure Laterality Date   FRACTURE SURGERY     tuabl ligation     TUBAL LIGATION     FAMILY HISTORY Family History  Problem Relation Age of Onset   Kidney failure Mother    Heart disease Mother    Liver disease Father    Stomach cancer Maternal Uncle 55   Colon cancer Neg Hx    Colon polyps Neg Hx    Esophageal cancer Neg Hx    Rectal cancer Neg Hx    SOCIAL HISTORY Social History   Tobacco Use   Smoking status: Every Day    Packs/day: 0.25    Types: Cigarettes   Smokeless tobacco: Never  Vaping Use   Vaping Use: Never used  Substance Use Topics   Alcohol use: Yes     Comment: socially   Drug use: No       OPHTHALMIC EXAM:  Base Eye Exam     Visual Acuity (Snellen - Linear)       Right Left   Dist cc 20/20 20/20    Correction: Glasses         Tonometry (Tonopen, 8:53 AM)       Right Left   Pressure 10 14         Pupils       Pupils Dark Light Shape React APD   Right PERRL 3 2 Round Brisk None   Left PERRL 3 2 Round Brisk None         Visual Fields       Left Right    Full Full         Extraocular Movement       Right Left    Full, Ortho Full, Ortho         Neuro/Psych     Oriented x3: Yes   Mood/Affect: Normal         Dilation     Both eyes: 2.5% Phenylephrine @ 8:53 AM           Slit Lamp and Fundus Exam     Slit Lamp Exam       Right Left   Lids/Lashes Dermatochalasis - upper lid Dermatochalasis - upper lid   Conjunctiva/Sclera Melanosis, nasal pingeucula Melanosis, nasal pingeucula   Cornea arcus, 2+ Punctate epithelial erosions arcus, tear film debris, trace PEE   Anterior Chamber deep and clear deep and clear   Iris Round and dilated, No NVI Round and dilated, No NVI   Lens 2-3+ Nuclear sclerosis, 2+ Cortical cataract 2-3+ Nuclear sclerosis, 2+ Cortical cataract   Anterior Vitreous Mild syneresis Mild syneresis         Fundus Exam       Right Left   Disc Pink and Sharp Pink and Sharp   C/D Ratio 0.6 0.6   Macula Flat, Good foveal reflex, scattered MA/DBH greatest nasal mac, +cystic changes greatest nasal mac -- improved Flat, Good foveal reflex, scattered MA greatest nasal mac, trace cystic changes greatest nasal mac -- improved   Vessels attenuated, copper wiring, tortuosity, no NV mild attenuation, mild tortuosity, no NV   Periphery Attached, rare MA Attached, minimal MA           IMAGING AND PROCEDURES  Imaging and Procedures for 11/12/2022  OCT, Retina - OU - Both Eyes       Right Eye Quality was good. Scan locations included subfoveal. Central Foveal Thickness: 295.  Progression has improved. Findings  include no SRF, abnormal foveal contour, intraretinal hyper-reflective material, intraretinal fluid, vitreomacular adhesion (Interval improvement in +IRF/DME nasal and inferior fovea and macula).   Left Eye Quality was good. Scan locations included subfoveal. Central Foveal Thickness: 275. Progression has improved. Findings include normal foveal contour, no SRF, intraretinal hyper-reflective material, intraretinal fluid, vitreomacular adhesion (Interval improvement in mild cystic changes nasal macula).   Notes *Images captured and stored on drive  Diagnosis / Impression:  OD: interval improvement in +IRF/DME nasal and inferior fovea and macula OS: interval improvement in mild cystic changes nasal macula  Clinical management:  See below  Abbreviations: NFP - Normal foveal profile. CME - cystoid macular edema. PED - pigment epithelial detachment. IRF - intraretinal fluid. SRF - subretinal fluid. EZ - ellipsoid zone. ERM - epiretinal membrane. ORA - outer retinal atrophy. ORT - outer retinal tubulation. SRHM - subretinal hyper-reflective material. IRHM - intraretinal hyper-reflective material            ASSESSMENT/PLAN:    ICD-10-CM   1. Moderate nonproliferative diabetic retinopathy of both eyes with macular edema associated with type 2 diabetes mellitus (HCC)  E11.3313 OCT, Retina - OU - Both Eyes    2. Essential hypertension  I10     3. Hypertensive retinopathy of both eyes  H35.033     4. Combined forms of age-related cataract of both eyes  H25.813      Moderate Non-proliferative diabetic retinopathy w/ DME OU  - A1c 7.7 on 12.21.23 - The incidence, risk factors for progression, natural history and treatment options for diabetic retinopathy were discussed with patient.   - The need for close monitoring of blood glucose, blood pressure, and serum lipids, avoiding cigarette or any type of tobacco, and the need for long term follow up was also  discussed with patient. - exam scattered MA OU - OCT shows OD: interval improvement in +IRF/DME nasal and inferior fovea and macula; OS: interval improvement in mild cystic changes nasal macula - pt reports improved control of BG - no treatment recommended today -- will continue to monitor - f/u in 3-4 months -- DFE/OCT, possible injection  2,3. Hypertensive retinopathy OU - discussed importance of tight BP control - monitor  4. Mixed Cataract OU - The symptoms of cataract, surgical options, and treatments and risks were discussed with patient. - discussed diagnosis and progression  - under the expert management of [place] Ordered this visit:  No orders of the defined types were placed in this encounter.    Return for f/u 3-4 months, NPDR OU, DFE, OCT.  There are no Patient Instructions on file for this visit.   This document serves as a record of services personally performed by Gardiner Sleeper, MD, PhD. It was created on their behalf by Joetta Manners COT, an ophthalmic technician. The creation of this record is the provider's dictation and/or activities during the visit.    Electronically signed by: Joetta Manners COT 11/11/2022 11:42 PM  This document serves as a record of services personally performed by Gardiner Sleeper, MD, PhD. It was created on their behalf by San Jetty. Owens Shark, OA an ophthalmic technician. The creation of this record is the provider's dictation and/or activities during the visit.    Electronically signed by: San Jetty. Owens Shark, [place] 03.01.2024 11:42 PM  Gardiner Sleeper, M.D., Ph.D. Diseases & Surgery of the Retina and [place] 11/12/2022   I have reviewed the above documentation for accuracy  and completeness, and I agree with the above. Gardiner Sleeper, M.D., Ph.D. 11/15/22 12:02 AM  Abbreviations: M myopia (nearsighted); A astigmatism; H hyperopia (farsighted); P presbyopia; Mrx spectacle  prescription;  CTL contact lenses; OD right eye; OS left eye; OU both eyes  XT exotropia; ET esotropia; PEK punctate epithelial keratitis; PEE punctate epithelial erosions; DES dry eye syndrome; MGD meibomian gland dysfunction; ATs artificial tears; PFAT's preservative free artificial tears; [place] nuclear sclerotic cataract; PSC posterior subcapsular cataract; ERM epi-retinal membrane; PVD posterior vitreous detachment; RD retinal detachment; DM diabetes mellitus; DR diabetic retinopathy; NPDR non-proliferative diabetic retinopathy; PDR proliferative diabetic retinopathy; CSME clinically significant macular edema; DME diabetic macular edema; dbh dot blot hemorrhages; CWS cotton wool spot; POAG primary open angle glaucoma; C/D cup-to-disc ratio; HVF humphrey visual field; GVF goldmann visual field; OCT optical coherence tomography; IOP intraocular pressure; BRVO Branch retinal vein occlusion; CRVO central retinal vein occlusion; CRAO central retinal artery occlusion; BRAO branch retinal artery occlusion; RT retinal tear; SB scleral buckle; PPV pars plana vitrectomy; VH Vitreous hemorrhage; PRP panretinal laser photocoagulation; IVK intravitreal kenalog; VMT vitreomacular traction; MH Macular hole;  NVD neovascularization of the disc; NVE neovascularization elsewhere; AREDS age related eye disease study; ARMD age related macular degeneration; POAG primary open angle glaucoma; EBMD epithelial/anterior basement membrane dystrophy; ACIOL anterior chamber intraocular lens; IOL intraocular lens; PCIOL posterior chamber intraocular lens; Phaco/IOL phacoemulsification with intraocular lens placement; [place] photorefractive keratectomy; LASIK laser assisted in situ keratomileusis; HTN hypertension; DM diabetes mellitus; COPD chronic obstructive pulmonary disease

## 2022-11-12 ENCOUNTER — Ambulatory Visit (INDEPENDENT_AMBULATORY_CARE_PROVIDER_SITE_OTHER): Payer: Commercial Managed Care - HMO | Admitting: Ophthalmology

## 2022-11-12 DIAGNOSIS — Z419 Encounter for procedure for purposes other than remedying health state, unspecified: Secondary | ICD-10-CM | POA: Diagnosis not present

## 2022-11-12 DIAGNOSIS — E113313 Type 2 diabetes mellitus with moderate nonproliferative diabetic retinopathy with macular edema, bilateral: Secondary | ICD-10-CM | POA: Diagnosis not present

## 2022-11-12 DIAGNOSIS — H25813 Combined forms of age-related cataract, bilateral: Secondary | ICD-10-CM

## 2022-11-12 DIAGNOSIS — I10 Essential (primary) hypertension: Secondary | ICD-10-CM | POA: Diagnosis not present

## 2022-11-12 DIAGNOSIS — H35033 Hypertensive retinopathy, bilateral: Secondary | ICD-10-CM | POA: Diagnosis not present

## 2022-11-14 ENCOUNTER — Encounter (INDEPENDENT_AMBULATORY_CARE_PROVIDER_SITE_OTHER): Payer: Self-pay | Admitting: Ophthalmology

## 2022-11-23 ENCOUNTER — Other Ambulatory Visit: Payer: Self-pay | Admitting: Internal Medicine

## 2022-11-23 DIAGNOSIS — E1165 Type 2 diabetes mellitus with hyperglycemia: Secondary | ICD-10-CM

## 2022-11-24 DIAGNOSIS — M1812 Unilateral primary osteoarthritis of first carpometacarpal joint, left hand: Secondary | ICD-10-CM | POA: Diagnosis not present

## 2022-11-24 NOTE — Telephone Encounter (Signed)
Unable to refill per protocol, Rx expired. Discontinued 06/03/21, dose change.  Requested Prescriptions  Pending Prescriptions Disp Refills   metFORMIN (GLUCOPHAGE-XR) 500 MG 24 hr tablet [Pharmacy Med Name: metFORMIN HCl ER 500 MG Oral Tablet Extended Release 24 Hour] 60 tablet 0    Sig: Take 1 tablet by mouth once daily with breakfast     Endocrinology:  Diabetes - Biguanides Failed - 11/23/2022  6:58 PM      Failed - B12 Level in normal range and within 720 days    No results found for: "VITAMINB12"       Failed - CBC within normal limits and completed in the last 12 months    WBC  Date Value Ref Range Status  09/02/2022 5.4 3.4 - 10.8 x10E3/uL Final  04/09/2020 10.2 4.0 - 10.5 K/uL Final   RBC  Date Value Ref Range Status  09/02/2022 4.58 3.77 - 5.28 x10E6/uL Final  04/09/2020 4.30 3.87 - 5.11 MIL/uL Final   Hemoglobin  Date Value Ref Range Status  09/02/2022 13.4 11.1 - 15.9 g/dL Final   Hematocrit  Date Value Ref Range Status  09/02/2022 40.2 34.0 - 46.6 % Final   MCHC  Date Value Ref Range Status  09/02/2022 33.3 31.5 - 35.7 g/dL Final  04/09/2020 32.5 30.0 - 36.0 g/dL Final   [facility]  Date Value Ref Range Status  09/02/2022 29.3 26.6 - 33.0 pg Final  04/09/2020 29.1 26.0 - 34.0 pg Final   MCV  Date Value Ref Range Status  09/02/2022 88 79 - 97 fL Final   No results found for: "PLTCOUNTKUC", "LABPLAT", "POCPLA" RDW  Date Value Ref Range Status  09/02/2022 13.4 11.7 - 15.4 % Final         Passed - Cr in normal range and within 360 days    Creatinine, Ser  Date Value Ref Range Status  05/24/2022 0.81 0.57 - 1.00 mg/dL Final         Passed - HBA1C is between 0 and 7.9 and within 180 days    HbA1c, POC (controlled diabetic range)  Date Value Ref Range Status  09/02/2022 7.7 (A) 0.0 - 7.0 % Final         Passed - eGFR in normal range and within 360 days    GFR calc Af Amer  Date Value Ref Range Status  05/01/2020 123 >59 mL/min/1.73 Final    Comment:     **[facility] currently reports eGFR in compliance with the current**   recommendations of the [employer]. [facility] will   update reporting as new guidelines are published from the NKF-ASN   Task force.    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, macular edema presence unspecified, unspecified retinopathy severity, unspecified whether long term insulin use [facility])   [place] [name] B, MD   6 months ago Hypertension associated with diabetes [facility])   [place] Ausdall, [place] L, RPH-CPP   6 months ago 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, macular edema presence unspecified,  unspecified retinopathy severity, unspecified whether long term insulin use [facility])   [place] [name] B, MD   11 months ago Type 2 diabetes mellitus with hyperglycemia, unspecified whether long term insulin use [facility])   [place], [place], [place]   1 year ago Type 2 diabetes mellitus with hyperglycemia, unspecified whether long term insulin use [facility])   [place], Dionne Bucy, [place]       Future Appointments             In 1 month Wynetta Emery, Dalbert Batman, MD [place]

## 2022-12-09 DIAGNOSIS — M1812 Unilateral primary osteoarthritis of first carpometacarpal joint, left hand: Secondary | ICD-10-CM | POA: Diagnosis not present

## 2022-12-13 DIAGNOSIS — Z419 Encounter for procedure for purposes other than remedying health state, unspecified: Secondary | ICD-10-CM | POA: Diagnosis not present

## 2023-01-03 ENCOUNTER — Ambulatory Visit: Payer: Commercial Managed Care - HMO | Attending: Internal Medicine | Admitting: Internal Medicine

## 2023-01-03 ENCOUNTER — Encounter: Payer: Self-pay | Admitting: Internal Medicine

## 2023-01-03 VITALS — BP 114/72 | HR 70 | Temp 98.1°F | Ht 62.0 in | Wt 159.0 lb

## 2023-01-03 DIAGNOSIS — E11319 Type 2 diabetes mellitus with unspecified diabetic retinopathy without macular edema: Secondary | ICD-10-CM | POA: Diagnosis not present

## 2023-01-03 DIAGNOSIS — I152 Hypertension secondary to endocrine disorders: Secondary | ICD-10-CM

## 2023-01-03 DIAGNOSIS — J22 Unspecified acute lower respiratory infection: Secondary | ICD-10-CM | POA: Diagnosis not present

## 2023-01-03 DIAGNOSIS — Z1231 Encounter for screening mammogram for malignant neoplasm of breast: Secondary | ICD-10-CM | POA: Diagnosis not present

## 2023-01-03 DIAGNOSIS — E1159 Type 2 diabetes mellitus with other circulatory complications: Secondary | ICD-10-CM

## 2023-01-03 DIAGNOSIS — F17200 Nicotine dependence, unspecified, uncomplicated: Secondary | ICD-10-CM

## 2023-01-03 LAB — POCT GLYCOSYLATED HEMOGLOBIN (HGB A1C): HbA1c, POC (controlled diabetic range): 8.5 % — AB (ref 0.0–7.0)

## 2023-01-03 LAB — GLUCOSE, POCT (MANUAL RESULT ENTRY): POC Glucose: 222 mg/dl — AB (ref 70–99)

## 2023-01-03 MED ORDER — BENZONATATE 100 MG PO CAPS
100.0000 mg | ORAL_CAPSULE | Freq: Two times a day (BID) | ORAL | 0 refills | Status: DC | PRN
Start: 2023-01-03 — End: 2023-05-28

## 2023-01-03 NOTE — Progress Notes (Signed)
Patient ID: Tamara Barnes, female    DOB: 11/29/1965  MRN: [id number]  CC: Diabetes (DM f/u. /Pt requesting a new BS meter. )   Subjective: Tamara Barnes is a 57 y.o. female who presents for chronic ds management Her concerns today include:  Pt with hx of DM bilateral nonproliferative diabetic retinopathy,HTN,  tob dep, fibroids, vit D def.     Had surgery on LT thumb 11/24/2022. Currently in cast until 4.28.2024  DM: Results for orders placed or performed in visit on 01/03/23  POCT glucose (manual entry)  Result Value Ref Range   POC Glucose 222 (A) 70 - 99 mg/dl  POCT glycosylated hemoglobin (Hb A1C)  Result Value Ref Range   Hemoglobin A1C     HbA1c POC (<> result, manual entry)     HbA1c, POC (prediabetic range)     HbA1c, POC (controlled diabetic range) 8.5 (A) 0.0 - 7.0 %  A1C 8.7 Currently on Metformin 1 gram Q a.m and 1/2 tab Q p.m.  Started on Farxiga on last visit; changed to [place].  Filled rxn  once.  RF was over $100 so was unable to RF.  Has Lantus insulin which she only takes as needed. -doing okay with eating habits.  Drinks mainly water Request prescription for DM testing supplies Hx of DM retinopathy Compliant with taking amlodipine 10 mg daily and Cozaar 50 mg daily  C/o Congestion, coughing x 2 days.  No fever, body aches or SOB. Using Coricidin OTC  Tob dep:  no cigarettes in 2 wks.  Trying to quit.  Patient Active Problem List   Diagnosis Date Noted   Severe nonproliferative diabetic retinopathy of right eye 08/14/2020   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 08/14/2020   Nuclear sclerotic cataract of both eyes 08/14/2020   Vitreomacular adhesion of both eyes 08/14/2020   Abdominal pain    E coli bacteremia    Pyelonephritis 04/07/2020   Sinusitis 07/04/2012   Elevated BP 07/04/2012   Rhinitis medicamentosa 07/04/2012   FIBROIDS, UTERUS 02/24/2009   UNSPECIFIED ABNORMAL MAMMOGRAM 02/24/2009   DYSFUNCTIONAL UTERINE  BLEEDING 08/02/2008   ANEMIA 06/04/2008   SCIATICA 06/04/2008   TOBACCO DEPENDENCE 11/10/2006     Current Outpatient Medications on File Prior to Visit  Medication Sig Dispense Refill   amLODipine (NORVASC) 10 MG tablet Take 1 tablet (10 mg total) by mouth daily. 30 tablet 6   Blood Pressure Monitoring (BLOOD PRESSURE KIT) DEVI Use to check blood pressure once daily. 1 each 0   gabapentin (NEURONTIN) 300 MG capsule Take 1 capsule (300 mg total) by mouth at bedtime. 90 capsule 3   HYDROcodone-acetaminophen (NORCO/VICODIN) 5-325 MG tablet Take 1 tablet by mouth every 6 (six) hours as needed for severe pain. 10 tablet 0   Insulin Pen Needle 32G X 4 MM MISC 1 each by Does not apply route in the morning and at bedtime. 100 each 3   losartan (COZAAR) 50 MG tablet Take 1 tablet (50 mg total) by mouth daily. 90 tablet 1   meloxicam (MOBIC) 15 MG tablet Take 1 tablet by mouth once daily 30 tablet 0   metFORMIN (GLUCOPHAGE) 1000 MG tablet Take 1 tablet (1000mg ) by mouth in the morning and 1/2 tablet (500mg ) by mouth in the evening. 45 tablet 2   Vitamin D, Ergocalciferol, (DRISDOL) 1.25 MG (50000 UNIT) CAPS capsule Take 1 capsule (50,000 Units total) by mouth every 7 (seven) days. 16 capsule 0   empagliflozin (JARDIANCE) 10 MG TABS  tablet Take 1 tablet (10 mg total) by mouth daily before breakfast. (Patient not taking: Reported on 01/03/2023) 30 tablet 3   insulin glargine (LANTUS SOLOSTAR) 100 UNIT/ML Solostar Pen Inject 10 Units into the skin at bedtime. (Patient not taking: Reported on 01/03/2023) 15 mL PRN   No current facility-administered medications on file prior to visit.    Allergies  Allergen Reactions   Oatmeal Shortness Of Breath and Other (See Comments)    Tongue swells   Rice Shortness Of Breath, Swelling and Other (See Comments)    Tongue swells   Wheat Shortness Of Breath, Swelling and Other (See Comments)    Tongue swells   Lisinopril Cough    Social History   Socioeconomic  History   Marital status: Single    Spouse name: Not on file   Number of children: Not on file   Years of education: Not on file   Highest education level: Not on file  Occupational History   Not on file  Tobacco Use   Smoking status: Every Day    Packs/day: .25    Types: Cigarettes   Smokeless tobacco: Never  Vaping Use   Vaping Use: Never used  Substance and Sexual Activity   Alcohol use: Yes    Comment: socially   Drug use: No   Sexual activity: Not Currently    Birth control/protection: None  Other Topics Concern   Not on file  Social History Narrative   Not on file   Social Determinants of Health   Financial Resource Strain: Not on file  Food Insecurity: Not on file  Transportation Barnes: No Transportation Barnes (05/23/2020)   PRAPARE - Administrator, Civil Service (Medical): No    Lack of Transportation (Non-Medical): No  Physical Activity: Not on file  Stress: Not on file  Social Connections: Not on file  Intimate Partner Violence: Not on file    Family History  Problem Relation Age of Onset   Kidney failure Mother    Heart disease Mother    Liver disease Father    Stomach cancer Maternal Uncle 57   Colon cancer Neg Hx    Colon polyps Neg Hx    Esophageal cancer Neg Hx    Rectal cancer Neg Hx     Past Surgical History:  Procedure Laterality Date   FRACTURE SURGERY     tuabl ligation     TUBAL LIGATION      ROS: Review of Systems Negative except as stated above  PHYSICAL EXAM: BP 114/72 (BP Location: Right Arm, Patient Position: Sitting, Cuff Size: Normal)   Pulse 70   Temp 98.1 F (36.7 C) (Oral)   Ht  (1.575 m)   Wt 159 lb (72.1 kg)   LMP 01/14/2015   SpO2 99%   BMI 29.08 kg/m   Physical Exam  General appearance - alert, well appearing, middle age AAF and in no distress Mental status - normal mood, behavior, speech, dress, motor activity, and thought processes Neck - supple, no significant adenopathy Chest - clear  to auscultation, no wheezes, rales or rhonchi, symmetric air entry Heart - normal rate, regular rhythm, normal S1, S2, no murmurs, rubs, clicks or gallops Extremities - peripheral pulses normal, no pedal edema, no clubbing or cyanosis MSK:  cast on LT hand/forearm     Latest Ref Rng & Units 09/02/2022    4:03 PM 05/24/2022    2:42 PM 10/01/2021   11:04 AM  CMP  Glucose 70 -  99 mg/dL  409  811   BUN 6 - 24 mg/dL  17  16   Creatinine 9.14 - 1.00 mg/dL  7.82  9.56   Sodium 213 - 144 mmol/L  146  145   Potassium 3.5 - 5.2 mmol/L  4.8  4.6   Chloride 96 - 106 mmol/L  108  106   CO2 20 - 29 mmol/L  23  25   Calcium 8.7 - 10.2 mg/dL  9.4  08.6   Total Protein 6.0 - 8.5 g/dL 7.7   7.6   Total Bilirubin 0.0 - 1.2 mg/dL <5.7   <8.4   Alkaline Phos 44 - 121 IU/L 106   95   AST 0 - 40 IU/L 15   16   ALT 0 - 32 IU/L 20   13    Lipid Panel     Component Value Date/Time   CHOL 192 09/02/2022 1603   TRIG 217 (H) 09/02/2022 1603   HDL 82 09/02/2022 1603   CHOLHDL 2.3 09/02/2022 1603   CHOLHDL 2.7 Ratio 06/04/2008 2136   VLDL 45 (H) 06/04/2008 2136   LDLCALC 75 09/02/2022 1603    CBC    Component Value Date/Time   WBC 5.4 09/02/2022 1603   WBC 10.2 04/09/2020 1048   RBC 4.58 09/02/2022 1603   RBC 4.30 04/09/2020 1048   HGB 13.4 09/02/2022 1603   HCT 40.2 09/02/2022 1603   PLT 194 09/02/2022 1603   MCV 88 09/02/2022 1603   MCH 29.3 09/02/2022 1603   MCH 29.1 04/09/2020 1048   MCHC 33.3 09/02/2022 1603   MCHC 32.5 04/09/2020 1048   RDW 13.4 09/02/2022 1603   LYMPHSABS 1.8 05/13/2021 0944   EOSABS 0.0 05/13/2021 0944   BASOSABS 0.0 05/13/2021 0944    ASSESSMENT AND PLAN: 1. 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 Continue metformin 1 g in the morning and 500 mg in the evening.  She does not tolerate a higher dose.  I will have our clinical  pharmacist check to see whether she Barnes prior approval for Jardiance.  In the meantime I recommend that she restarts Lantus insulin taking 5 units at bedtime until we get an answer about the Jardiance. - POCT glucose (manual entry) - POCT glycosylated hemoglobin (Hb A1C)  2. Hypertension associated with diabetes At goal.  Continue amlodipine and Cozaar at current dose  3. Tobacco dependence Commended her on cutting back and trying to quit.  Encouraged her to remain tobacco free.  4. Acute respiratory infection We will screen for COVID. Okay to continue Coricidin HBP - benzonatate (TESSALON) 100 MG capsule; Take 1 capsule (100 mg total) by mouth 2 (two) times daily as needed for cough.  Dispense: 20 capsule; Refill: 0 - Novel Coronavirus, NAA ([facility])  5. Encounter for screening mammogram for malignant neoplasm of breast - MM Digital Screening; Future    Patient was given the opportunity to ask questions.  Patient verbalized understanding of the plan and was able to repeat key elements of the plan.   This documentation was completed using Paediatric nurse.  Any transcriptional errors are unintentional.  Orders Placed This Encounter  Procedures   Novel Coronavirus, NAA ([facility])   MM Digital Screening   POCT glucose (manual entry)   POCT glycosylated hemoglobin (Hb A1C)     Requested Prescriptions   Signed Prescriptions Disp Refills  benzonatate (TESSALON) 100 MG capsule 20 capsule 0    Sig: Take 1 capsule (100 mg total) by mouth 2 (two) times daily as needed for cough.    Return in about 4 months (around 05/05/2023).  Jonah Blue, MD, FACP

## 2023-01-03 NOTE — Patient Instructions (Signed)
Restart Lantus insulin 5 units at bedtime until I have our pharmacist try to get prior approval on Jardiance.

## 2023-01-04 ENCOUNTER — Other Ambulatory Visit: Payer: Self-pay | Admitting: Internal Medicine

## 2023-01-05 ENCOUNTER — Telehealth: Payer: Self-pay | Admitting: Internal Medicine

## 2023-01-05 ENCOUNTER — Other Ambulatory Visit: Payer: Self-pay

## 2023-01-05 LAB — NOVEL CORONAVIRUS, NAA: SARS-CoV-2, NAA: NOT DETECTED

## 2023-01-05 NOTE — Telephone Encounter (Signed)
-----   Message from Weldon Picking, CPhT sent at 01/05/2023  8:14 AM EDT ----- Yes, both her insurances are requiring a prior auth. I will add it to my queue. May not hear back from insurance until next week. ----- Message ----- From: Marcine Matar, MD Sent: 01/03/2023   1:35 PM EDT To: Weldon Picking, CPhT  Does this pt needs pre-approval on Jardiance?  I had initially prescribed Farxiga but I think it was changed to [place] due to insurance preference.  Patient told me that she got the Jardiance filled the first time without problem but then the second time the cost was over $100

## 2023-01-06 DIAGNOSIS — M1812 Unilateral primary osteoarthritis of first carpometacarpal joint, left hand: Secondary | ICD-10-CM | POA: Diagnosis not present

## 2023-01-06 DIAGNOSIS — M25632 Stiffness of left wrist, not elsewhere classified: Secondary | ICD-10-CM | POA: Diagnosis not present

## 2023-01-07 ENCOUNTER — Other Ambulatory Visit: Payer: Self-pay

## 2023-01-10 ENCOUNTER — Other Ambulatory Visit: Payer: Self-pay

## 2023-01-10 DIAGNOSIS — M25632 Stiffness of left wrist, not elsewhere classified: Secondary | ICD-10-CM | POA: Diagnosis not present

## 2023-01-10 DIAGNOSIS — M79642 Pain in left hand: Secondary | ICD-10-CM | POA: Diagnosis not present

## 2023-01-12 DIAGNOSIS — Z419 Encounter for procedure for purposes other than remedying health state, unspecified: Secondary | ICD-10-CM | POA: Diagnosis not present

## 2023-01-13 ENCOUNTER — Other Ambulatory Visit: Payer: Self-pay

## 2023-01-13 ENCOUNTER — Telehealth: Payer: Self-pay

## 2023-01-13 DIAGNOSIS — M79642 Pain in left hand: Secondary | ICD-10-CM | POA: Insufficient documentation

## 2023-01-13 NOTE — Telephone Encounter (Signed)
(  Notes: JARDIANCE PRIOR AUTH CASE ID: [id number] SUBMITTED VERBALLY TO [employer] 01/07/23 DENIED-NO INDICATION-CRITERIA NOT MET-RECONSIDERATION: FAX [phone]) Still working on obtaining a prior authorization approval through patient's primary [employer]. Tamara Barnes is preferred on primary if appropriate to change back. Secondary insurance, managed care medicaid, has been approved but pharmacy is unable to process claim without primary payment due to secondary billing restrictions.   I will submit a reconsideration request to insurance today.

## 2023-01-14 ENCOUNTER — Inpatient Hospital Stay: Admission: RE | Admit: 2023-01-14 | Payer: Commercial Managed Care - HMO | Source: Ambulatory Visit

## 2023-01-15 ENCOUNTER — Other Ambulatory Visit: Payer: Self-pay

## 2023-01-15 MED ORDER — DAPAGLIFLOZIN PROPANEDIOL 10 MG PO TABS
10.0000 mg | ORAL_TABLET | Freq: Every day | ORAL | 1 refills | Status: DC
  Filled 2023-01-15 – 2023-01-17 (×4): qty 30, 30d supply, fill #0

## 2023-01-15 NOTE — Telephone Encounter (Signed)
I have changed to Farxiga

## 2023-01-17 ENCOUNTER — Other Ambulatory Visit: Payer: Self-pay

## 2023-01-18 ENCOUNTER — Other Ambulatory Visit: Payer: Self-pay

## 2023-01-18 DIAGNOSIS — M25632 Stiffness of left wrist, not elsewhere classified: Secondary | ICD-10-CM | POA: Diagnosis not present

## 2023-01-19 ENCOUNTER — Other Ambulatory Visit: Payer: Self-pay

## 2023-01-20 ENCOUNTER — Ambulatory Visit
Admission: RE | Admit: 2023-01-20 | Discharge: 2023-01-20 | Disposition: A | Discharge status: Home / Self Care | Payer: Commercial Managed Care - HMO | Source: Ambulatory Visit | Attending: Internal Medicine | Admitting: Internal Medicine

## 2023-01-20 DIAGNOSIS — Z1231 Encounter for screening mammogram for malignant neoplasm of breast: Secondary | ICD-10-CM

## 2023-01-21 DIAGNOSIS — M25632 Stiffness of left wrist, not elsewhere classified: Secondary | ICD-10-CM | POA: Diagnosis not present

## 2023-01-24 ENCOUNTER — Other Ambulatory Visit: Payer: Self-pay

## 2023-02-08 DIAGNOSIS — M25632 Stiffness of left wrist, not elsewhere classified: Secondary | ICD-10-CM | POA: Diagnosis not present

## 2023-02-12 DIAGNOSIS — Z419 Encounter for procedure for purposes other than remedying health state, unspecified: Secondary | ICD-10-CM | POA: Diagnosis not present

## 2023-02-15 DIAGNOSIS — M25632 Stiffness of left wrist, not elsewhere classified: Secondary | ICD-10-CM | POA: Diagnosis not present

## 2023-02-18 ENCOUNTER — Ambulatory Visit: Payer: Self-pay | Admitting: *Deleted

## 2023-02-18 NOTE — Telephone Encounter (Signed)
Message from Bradford Woods sent at 02/18/2023 12:41 PM EDT  Summary: Pt complains that the bottom of her foot is still sore   Pt reports that she has done everything that she was asked to do but the bottom of her foot is still very sore. Pt requests call back to discuss other options. Cb# [phone]          Call History   Type Contact Phone/Fax User  02/18/2023 12:39 PM EDT Phone (Incoming) Sharolyn Douglas (Self) [phone] Judie Petit) Marylen Ponto   Reason for Disposition  Foot pain is a chronic symptom (recurrent or ongoing AND present > 4 weeks)  Answer Assessment - Initial Assessment Questions 1. ONSET: "When did the pain start?"      Left foot is sore on the bottom.   I got new shoes and it helped some but now it's really sore.   I've soaked it and rolled it on an ice bottle but it's sore from middle to my heel.   When walking my heel hurts. 2. LOCATION: "Where is the pain located?"      Left foot 3. PAIN: "How bad is the pain?"    (Scale 1-10; or mild, moderate, severe)  - MILD (1-3): doesn't interfere with normal activities.   - MODERATE (4-7): interferes with normal activities (e.g., work or school) or awakens from sleep, limping.   - SEVERE (8-10): excruciating pain, unable to do any normal activities, unable to walk.      Moderate 4. WORK OR EXERCISE: "Has there been any recent work or exercise that involved this part of the body?"      No 5. CAUSE: "What do you think is causing the foot pain?"     I'm not sure 6. OTHER SYMPTOMS: "Do you have any other symptoms?" (e.g., leg pain, rash, fever, numbness)     It's just real sore 7. PREGNANCY: "Is there any chance you are pregnant?" "When was your last menstrual period?"     N/A due to age  Protocols used: Foot Pain-A-AH

## 2023-02-18 NOTE — Telephone Encounter (Signed)
  Chief Complaint: Bottom of left foot is very sore from middle of her foot to the heel. Symptoms: Above   Bought new shoes which helped for a little while but left foot is sore again.   Right foot is fine.   Saw Dr. Laural Benes about this once before per pt. Frequency: "For a while now" Pertinent Negatives: Patient denies injuries or accidents Disposition: [] ED /[] Urgent Care (no appt availability in office) / [x] Appointment(In office/virtual)/ []  [facility]/ [] Home Care/ [] Refused Recommended Disposition /[] [facility]/ []  Follow-up with PCP Additional Notes: Appt made with Dr Laural Benes for 02/22/2023 at 10:30.    Pt also mentioned itching in her vaginal area since starting the Farxiga.  She used Monostat from OTC which helped but she is still itching   She will mention this during her appt. On Tues.

## 2023-02-22 ENCOUNTER — Ambulatory Visit: Payer: Commercial Managed Care - HMO | Attending: Internal Medicine | Admitting: Internal Medicine

## 2023-02-22 ENCOUNTER — Other Ambulatory Visit: Payer: Self-pay

## 2023-02-22 ENCOUNTER — Other Ambulatory Visit (HOSPITAL_COMMUNITY)
Admission: RE | Admit: 2023-02-22 | Discharge: 2023-02-22 | Disposition: A | Discharge status: Home / Self Care | Payer: Commercial Managed Care - HMO | Source: Ambulatory Visit | Attending: Internal Medicine | Admitting: Internal Medicine

## 2023-02-22 ENCOUNTER — Encounter: Payer: Self-pay | Admitting: Internal Medicine

## 2023-02-22 VITALS — BP 110/75 | HR 70 | Temp 98.2°F | Ht 62.0 in | Wt 154.0 lb

## 2023-02-22 DIAGNOSIS — E1165 Type 2 diabetes mellitus with hyperglycemia: Secondary | ICD-10-CM | POA: Diagnosis not present

## 2023-02-22 DIAGNOSIS — N761 Subacute and chronic vaginitis: Secondary | ICD-10-CM | POA: Diagnosis not present

## 2023-02-22 DIAGNOSIS — M79672 Pain in left foot: Secondary | ICD-10-CM | POA: Diagnosis not present

## 2023-02-22 DIAGNOSIS — Z794 Long term (current) use of insulin: Secondary | ICD-10-CM

## 2023-02-22 DIAGNOSIS — G8929 Other chronic pain: Secondary | ICD-10-CM | POA: Diagnosis not present

## 2023-02-22 LAB — GLUCOSE, POCT (MANUAL RESULT ENTRY): POC Glucose: 172 mg/dl — AB (ref 70–99)

## 2023-02-22 MED ORDER — ACCU-CHEK SOFTCLIX LANCETS MISC
12 refills | Status: AC
Start: 2023-02-22 — End: ?
  Filled 2023-02-22: qty 100, 50d supply, fill #0

## 2023-02-22 MED ORDER — ACCU-CHEK GUIDE W/DEVICE KIT
PACK | 0 refills | Status: AC
Start: 2023-02-22 — End: ?
  Filled 2023-02-22: qty 1, 30d supply, fill #0

## 2023-02-22 MED ORDER — ACCU-CHEK GUIDE VI STRP
ORAL_STRIP | 12 refills | Status: AC
Start: 2023-02-22 — End: ?
  Filled 2023-02-22: qty 100, 50d supply, fill #0

## 2023-02-22 MED ORDER — NAPROXEN 500 MG PO TABS
500.0000 mg | ORAL_TABLET | Freq: Two times a day (BID) | ORAL | 0 refills | Status: DC | PRN
Start: 2023-02-22 — End: 2023-11-28
  Filled 2023-02-22: qty 20, 10d supply, fill #0

## 2023-02-22 MED ORDER — FLUCONAZOLE 150 MG PO TABS
150.0000 mg | ORAL_TABLET | Freq: Every day | ORAL | 0 refills | Status: DC
Start: 2023-02-22 — End: 2024-01-17
  Filled 2023-02-22: qty 1, 1d supply, fill #0
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
## 2023-02-23 ENCOUNTER — Other Ambulatory Visit: Payer: Self-pay | Admitting: Internal Medicine

## 2023-02-23 LAB — CERVICOVAGINAL ANCILLARY ONLY
Bacterial Vaginitis (gardnerella): POSITIVE — AB
Candida Glabrata: NEGATIVE
Candida Vaginitis: POSITIVE — AB
Chlamydia: NEGATIVE
Comment: NEGATIVE
Comment: NEGATIVE
Comment: NEGATIVE
Comment: NEGATIVE
Comment: NEGATIVE
Comment: NORMAL
Neisseria Gonorrhea: NEGATIVE
Trichomonas: POSITIVE — AB

## 2023-02-23 MED ORDER — METRONIDAZOLE 500 MG PO TABS
500.0000 mg | ORAL_TABLET | Freq: Two times a day (BID) | ORAL | 0 refills | Status: DC
  Filled 2023-02-23 – 2023-04-08 (×3): qty 14, 7d supply, fill #0

## 2023-02-24 ENCOUNTER — Other Ambulatory Visit: Payer: Self-pay

## 2023-02-24 DIAGNOSIS — M25632 Stiffness of left wrist, not elsewhere classified: Secondary | ICD-10-CM | POA: Diagnosis not present

## 2023-02-28 ENCOUNTER — Ambulatory Visit
Admission: RE | Admit: 2023-02-28 | Discharge: 2023-02-28 | Disposition: A | Discharge status: Home / Self Care | Payer: Commercial Managed Care - HMO | Source: Ambulatory Visit | Attending: Internal Medicine | Admitting: Internal Medicine

## 2023-02-28 DIAGNOSIS — G8929 Other chronic pain: Secondary | ICD-10-CM

## 2023-02-28 DIAGNOSIS — M25562 Pain in left knee: Secondary | ICD-10-CM | POA: Diagnosis not present

## 2023-03-01 ENCOUNTER — Other Ambulatory Visit: Payer: Self-pay | Admitting: Physician Assistant

## 2023-03-01 DIAGNOSIS — G629 Polyneuropathy, unspecified: Secondary | ICD-10-CM

## 2023-03-01 NOTE — Telephone Encounter (Signed)
Requested Prescriptions  Pending Prescriptions Disp Refills   gabapentin (NEURONTIN) 300 MG capsule [Pharmacy Med Name: Gabapentin 300 MG Oral Capsule] 90 capsule 0    Sig: Take 1 capsule by mouth at bedtime     Neurology: Anticonvulsants - gabapentin Passed - 03/01/2023  1:29 PM      Passed - Cr in normal range and within 360 days    Creatinine, Ser  Date Value Ref Range Status  05/24/2022 0.81 0.57 - 1.00 mg/dL Final         Passed - Completed PHQ-2 or PHQ-9 in the last 360 days      Passed - Valid encounter within last 12 months    Recent Outpatient Visits           1 week ago Subacute vaginitis   [place] [facility] Jonah Blue B, MD   1 month ago 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, macular edema presence unspecified, unspecified retinopathy severity, unspecified whether long term insulin use (HCC)   [place] [facility] Jonah Blue B, MD   6 months ago 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, macular edema presence unspecified, unspecified retinopathy severity, unspecified whether long term insulin use [facility])   [place] [facility] Jonah Blue B, MD   9 months ago Hypertension associated with diabetes [facility])   [place] [facility] [place], [place] L, RPH-CPP   10 months ago 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

## 2023-03-02 ENCOUNTER — Other Ambulatory Visit: Payer: Self-pay

## 2023-03-07 ENCOUNTER — Other Ambulatory Visit: Payer: Self-pay

## 2023-03-08 DIAGNOSIS — M25842 Other specified joint disorders, left hand: Secondary | ICD-10-CM | POA: Diagnosis not present

## 2023-03-08 DIAGNOSIS — M1812 Unilateral primary osteoarthritis of first carpometacarpal joint, left hand: Secondary | ICD-10-CM | POA: Diagnosis not present

## 2023-03-14 ENCOUNTER — Encounter (INDEPENDENT_AMBULATORY_CARE_PROVIDER_SITE_OTHER): Payer: Commercial Managed Care - HMO | Admitting: Ophthalmology

## 2023-03-14 ENCOUNTER — Other Ambulatory Visit: Payer: Self-pay

## 2023-03-14 DIAGNOSIS — Z794 Long term (current) use of insulin: Secondary | ICD-10-CM

## 2023-03-14 DIAGNOSIS — E113313 Type 2 diabetes mellitus with moderate nonproliferative diabetic retinopathy with macular edema, bilateral: Secondary | ICD-10-CM

## 2023-03-14 DIAGNOSIS — Z419 Encounter for procedure for purposes other than remedying health state, unspecified: Secondary | ICD-10-CM | POA: Diagnosis not present

## 2023-03-14 DIAGNOSIS — H25813 Combined forms of age-related cataract, bilateral: Secondary | ICD-10-CM

## 2023-03-14 DIAGNOSIS — Z7984 Long term (current) use of oral hypoglycemic drugs: Secondary | ICD-10-CM

## 2023-03-14 DIAGNOSIS — H35033 Hypertensive retinopathy, bilateral: Secondary | ICD-10-CM

## 2023-03-14 DIAGNOSIS — I10 Essential (primary) hypertension: Secondary | ICD-10-CM

## 2023-03-15 ENCOUNTER — Other Ambulatory Visit: Payer: Self-pay

## 2023-03-15 ENCOUNTER — Ambulatory Visit (INDEPENDENT_AMBULATORY_CARE_PROVIDER_SITE_OTHER): Payer: Commercial Managed Care - HMO | Admitting: Podiatry

## 2023-03-15 ENCOUNTER — Ambulatory Visit: Payer: Commercial Managed Care - HMO

## 2023-03-15 ENCOUNTER — Encounter: Payer: Self-pay | Admitting: Podiatry

## 2023-03-15 DIAGNOSIS — M722 Plantar fascial fibromatosis: Secondary | ICD-10-CM

## 2023-03-15 MED ORDER — MELOXICAM 15 MG PO TABS
15.0000 mg | ORAL_TABLET | Freq: Every day | ORAL | 3 refills | Status: DC
  Filled 2023-03-15 – 2023-04-08 (×2): qty 30, 30d supply, fill #0

## 2023-03-15 MED ORDER — TRIAMCINOLONE ACETONIDE 40 MG/ML IJ SUSP
20.0000 mg | Freq: Once | INTRAMUSCULAR | Status: AC
Start: 2023-03-15 — End: 2023-03-15
  Administered 2023-03-15: 20 mg

## 2023-03-15 NOTE — Progress Notes (Signed)
Subjective:  Patient ID: Tamara Barnes, female    DOB: [birth year],  MRN: [id number] HPI Chief Complaint  Patient presents with   Foot Pain    Plantar heel left - aching x 3-4 months, AM pain, tried new shoes, helped for a few days, but then came right back, noticing worsening through the day now, PCP Rx'd Naproxen, but doesn't have anymore   New Patient (Initial Visit)    57 y.o. female presents with the above complaint.   ROS:  Past Medical History:  Diagnosis Date   Diabetes mellitus without complication (HCC)    Hypertension    Past Surgical History:  Procedure Laterality Date   FRACTURE SURGERY     tuabl ligation     TUBAL LIGATION      Current Outpatient Medications:    meloxicam (MOBIC) 15 MG tablet, Take 1 tablet (15 mg total) by mouth daily., Disp: 30 tablet, Rfl: 3   Accu-Chek Softclix Lancets lancets, Use as instructed to check blood sugar twice a day, Disp: 100 each, Rfl: 12   amLODipine (NORVASC) 10 MG tablet, Take 1 tablet (10 mg total) by mouth daily., Disp: 30 tablet, Rfl: 6   benzonatate (TESSALON) 100 MG capsule, Take 1 capsule (100 mg total) by mouth 2 (two) times daily as needed for cough., Disp: 20 capsule, Rfl: 0   Blood Glucose Monitoring Suppl (ACCU-CHEK GUIDE) w/Device KIT, Use to check blood sugar twice a day., Disp: 1 kit, Rfl: 0   Blood Pressure Monitoring (BLOOD PRESSURE KIT) DEVI, Use to check blood pressure once daily., Disp: 1 each, Rfl: 0   fluconazole (DIFLUCAN) 150 MG tablet, Take 1 tablet (150 mg total) by mouth daily., Disp: 1 tablet, Rfl: 0   gabapentin (NEURONTIN) 300 MG capsule, Take 1 capsule by mouth at bedtime, Disp: 90 capsule, Rfl: 0   glucose blood (ACCU-CHEK GUIDE) test strip, Use as instructed to check blood sugar twice a day, Disp: 100 each, Rfl: 12   HYDROcodone-acetaminophen (NORCO/VICODIN) 5-325 MG tablet, Take 1 tablet by mouth every 6 (six) hours as needed for severe pain., Disp: 10 tablet, Rfl: 0   insulin glargine (LANTUS  SOLOSTAR) 100 UNIT/ML Solostar Pen, Inject 10 Units into the skin at bedtime., Disp: 15 mL, Rfl: PRN   Insulin Pen Needle 32G X 4 MM MISC, 1 each by Does not apply route in the morning and at bedtime., Disp: 100 each, Rfl: 3   losartan (COZAAR) 50 MG tablet, Take 1 tablet (50 mg total) by mouth daily., Disp: 90 tablet, Rfl: 1   metFORMIN (GLUCOPHAGE) 1000 MG tablet, Take 1 tablet (1000mg ) by mouth in the morning and 1/2 tablet (500mg ) by mouth in the evening., Disp: 45 tablet, Rfl: 2   metroNIDAZOLE (FLAGYL) 500 MG tablet, Take 1 tablet (500 mg total) by mouth 2 (two) times daily., Disp: 14 tablet, Rfl: 0   naproxen (NAPROSYN) 500 MG tablet, Take 1 tablet (500 mg total) by mouth 2 (two) times daily as needed (Take with food)., Disp: 20 tablet, Rfl: 0   Vitamin D, Ergocalciferol, (DRISDOL) 1.25 MG (50000 UNIT) CAPS capsule, Take 1 capsule (50,000 Units total) by mouth every 7 (seven) days., Disp: 16 capsule, Rfl: 0  Allergies  Allergen Reactions   Oatmeal Shortness Of Breath and Other (See Comments)    Tongue swells   Rice Shortness Of Breath, Swelling and Other (See Comments)    Tongue swells   Wheat Shortness Of Breath, Swelling and Other (See Comments)    Tongue swells  Marcelline Deist [Dapagliflozin] Other (See Comments)    Vaginal yeast infection   Lisinopril Cough   Review of Systems Objective:  There were no vitals filed for this visit.  General: Well developed, nourished, in no acute distress, alert and oriented x3   Dermatological: Skin is warm, dry and supple bilateral. Nails x 10 are well maintained; remaining integument appears unremarkable at this time. There are no open sores, no preulcerative lesions, no rash or signs of infection present.  Vascular: Dorsalis Pedis artery and Posterior Tibial artery pedal pulses are 2/4 bilateral with immedate capillary fill time. Pedal hair growth present. No varicosities and no lower extremity edema present bilateral.   Neruologic: Grossly  intact via light touch bilateral. Vibratory intact via tuning fork bilateral. Protective threshold with Semmes Wienstein monofilament intact to all pedal sites bilateral. Patellar and Achilles deep tendon reflexes 2+ bilateral. No Babinski or clonus noted bilateral.   Musculoskeletal: No gross boney pedal deformities bilateral. No pain, crepitus, or limitation noted with foot and ankle range of motion bilateral. Muscular strength 5/5 in all groups tested bilateral.  Pain on palpation medial calcaneal tubercle of her left foot.  She also has pes planovalgus.    Gait: Unassisted, Nonantalgic.    Radiographs:  Radiographs reviewed from previous evaluation does demonstrate plantar distally oriented calcaneal heel spur soft tissue increase in density at the plantar fascial calcaneal insertion site no acute findings.  Assessment & Plan:   Assessment: Plantar fasciitis with pes planovalgus.  Plan: Discussed etiology pathology conservative surgical therapies discussed appropriate shoe gear stretching exercise ice therapy sugar modifications posterior plantar fascial brace after an injection of 20 mg of Kenalog 5 mg Marcaine to the point of maximal tenderness at the plantar fascial calcaneal insertion site.  We also refilled her meloxicam.     Aviannah Castoro T. [place], [place]

## 2023-03-15 NOTE — Patient Instructions (Signed)

## 2023-03-22 ENCOUNTER — Other Ambulatory Visit: Payer: Self-pay

## 2023-04-08 ENCOUNTER — Other Ambulatory Visit: Payer: Self-pay

## 2023-04-08 ENCOUNTER — Emergency Department (HOSPITAL_COMMUNITY): Payer: Commercial Managed Care - HMO

## 2023-04-08 ENCOUNTER — Encounter (HOSPITAL_COMMUNITY): Payer: Self-pay

## 2023-04-08 ENCOUNTER — Emergency Department (HOSPITAL_COMMUNITY)
Admission: EM | Admit: 2023-04-08 | Discharge: 2023-04-08 | Disposition: A | Discharge status: Home / Self Care | Payer: Commercial Managed Care - HMO | Attending: Emergency Medicine | Admitting: Emergency Medicine

## 2023-04-08 DIAGNOSIS — K047 Periapical abscess without sinus: Secondary | ICD-10-CM | POA: Diagnosis not present

## 2023-04-08 DIAGNOSIS — J029 Acute pharyngitis, unspecified: Secondary | ICD-10-CM | POA: Insufficient documentation

## 2023-04-08 DIAGNOSIS — I10 Essential (primary) hypertension: Secondary | ICD-10-CM | POA: Diagnosis not present

## 2023-04-08 DIAGNOSIS — E119 Type 2 diabetes mellitus without complications: Secondary | ICD-10-CM | POA: Insufficient documentation

## 2023-04-08 DIAGNOSIS — Z79899 Other long term (current) drug therapy: Secondary | ICD-10-CM | POA: Insufficient documentation

## 2023-04-08 DIAGNOSIS — K029 Dental caries, unspecified: Secondary | ICD-10-CM | POA: Diagnosis not present

## 2023-04-08 DIAGNOSIS — Z794 Long term (current) use of insulin: Secondary | ICD-10-CM | POA: Insufficient documentation

## 2023-04-08 DIAGNOSIS — Z7984 Long term (current) use of oral hypoglycemic drugs: Secondary | ICD-10-CM | POA: Diagnosis not present

## 2023-04-08 DIAGNOSIS — R220 Localized swelling, mass and lump, head: Secondary | ICD-10-CM | POA: Diagnosis present

## 2023-04-08 LAB — I-STAT CHEM 8, ED
BUN: 22 mg/dL — ABNORMAL HIGH (ref 6–20)
Calcium, Ion: 1.21 mmol/L (ref 1.15–1.40)
Chloride: 108 mmol/L (ref 98–111)
Creatinine, Ser: 0.8 mg/dL (ref 0.44–1.00)
Glucose, Bld: 201 mg/dL — ABNORMAL HIGH (ref 70–99)
HCT: 40 % (ref 36.0–46.0)
Hemoglobin: 13.6 g/dL (ref 12.0–15.0)
Potassium: 4.6 mmol/L (ref 3.5–5.1)
Sodium: 140 mmol/L (ref 135–145)
TCO2: 24 mmol/L (ref 22–32)

## 2023-04-08 LAB — GROUP A STREP BY PCR: Group A Strep by PCR: NOT DETECTED

## 2023-04-08 LAB — CBG MONITORING, ED: Glucose-Capillary: 183 mg/dL — ABNORMAL HIGH (ref 70–99)

## 2023-04-08 MED ORDER — PENICILLIN V POTASSIUM 500 MG PO TABS
500.0000 mg | ORAL_TABLET | Freq: Four times a day (QID) | ORAL | 0 refills | Status: AC
  Filled 2023-04-08: qty 28, 7d supply, fill #0

## 2023-04-08 MED ORDER — OXYCODONE-ACETAMINOPHEN 5-325 MG PO TABS
1.0000 | ORAL_TABLET | Freq: Once | ORAL | Status: AC
  Administered 2023-04-08: 1 via ORAL
  Filled 2023-04-08: qty 1

## 2023-04-08 MED ORDER — DEXAMETHASONE SODIUM PHOSPHATE 10 MG/ML IJ SOLN
10.0000 mg | Freq: Once | INTRAMUSCULAR | Status: AC
  Administered 2023-04-08: 10 mg via INTRAVENOUS
  Filled 2023-04-08: qty 1

## 2023-04-08 MED ORDER — LIDOCAINE VISCOUS HCL 2 % MT SOLN
15.0000 mL | Freq: Once | OROMUCOSAL | Status: AC
  Administered 2023-04-08: 15 mL via OROMUCOSAL
  Filled 2023-04-08: qty 15

## 2023-04-08 MED ORDER — IOHEXOL 350 MG/ML SOLN
75.0000 mL | Freq: Once | INTRAVENOUS | Status: AC | PRN
  Administered 2023-04-08: 75 mL via INTRAVENOUS

## 2023-04-08 MED ORDER — OXYCODONE-ACETAMINOPHEN 5-325 MG PO TABS
1.0000 | ORAL_TABLET | Freq: Four times a day (QID) | ORAL | 0 refills | Status: DC | PRN
  Filled 2023-04-08: qty 10, 3d supply, fill #0

## 2023-04-08 NOTE — Discharge Instructions (Addendum)
You were seen in the emergency department today for dental pain and facial swelling.  You do have dental infections which I am going to treat with penicillin.  Additionally we will give you a short course of pain medication called Percocet to help with your symptoms.  You have been prescribed a medication that is considered an opiate. Opiates are pain medications that should be used with caution. It is important that you do not drive while taking this medication as it can cause drowsiness and impaired reaction times. Do not mix this medication with benzodiazepine medications or alcohol as this can cause respiratory depression. Additionally, opiates have addicting properties to them. Please use medication as prescribed by your provider.  Please follow-up with your dentist additionally I have given you a dental resource guide if the 1 you are currently seeing you have issues with scheduling.  Please return to the emergency department if you have severely worsening symptoms, cannot swallow liquids or have significant facial swelling with fever.

## 2023-04-08 NOTE — ED Provider Notes (Signed)
[place] EMERGENCY DEPARTMENT AT [facility] Provider Note   CSN: [id number] Arrival date & time: 04/08/23  0725     History  Chief Complaint  Patient presents with   Sore Throat   Otalgia    Tamara Barnes is a 57 y.o. female.  With past medical history of hypertension, diabetes who presents to the emergency department with sore throat.  States she has had 4 days of worsening sore throat.  States that she is having difficulty swallowing solid foods and change in voice since onset of symptoms.  She notes that she has had some mild swelling to the left side of her face.  She is also having radiation of pain into her left ear.  She denies having fevers.  Has been tolerating liquid p.o.  She does note that she has a tooth that is infected and crooked in the left lower mouth.  This has been there for about 1 year.  She denies shortness of breath.   Sore Throat  Otalgia Associated symptoms: sore throat        Home Medications Prior to Admission medications   Medication Sig Start Date End Date Taking? Authorizing Provider  oxyCODONE-acetaminophen (PERCOCET/ROXICET) 5-325 MG tablet Take 1 tablet by mouth every 6 (six) hours as needed for severe pain. 04/08/23  Yes Cristopher Peru, PA-C  penicillin v potassium (VEETID) 500 MG tablet Take 1 tablet (500 mg total) by mouth 4 (four) times daily for 7 days. 04/08/23 04/15/23 Yes Cristopher Peru, PA-C  Accu-Chek Softclix Lancets lancets Use as instructed to check blood sugar twice a day 02/22/23   Marcine Matar, MD  amLODipine (NORVASC) 10 MG tablet Take 1 tablet (10 mg total) by mouth daily. 09/02/22   Marcine Matar, MD  benzonatate (TESSALON) 100 MG capsule Take 1 capsule (100 mg total) by mouth 2 (two) times daily as needed for cough. 01/03/23   Marcine Matar, MD  Blood Glucose Monitoring Suppl (ACCU-CHEK GUIDE) w/Device KIT Use to check blood sugar twice a day. 02/22/23   Marcine Matar, MD  Blood Pressure  Monitoring (BLOOD PRESSURE KIT) DEVI Use to check blood pressure once daily. 05/24/22   Marcine Matar, MD  fluconazole (DIFLUCAN) 150 MG tablet Take 1 tablet (150 mg total) by mouth daily. 02/22/23   Marcine Matar, MD  gabapentin (NEURONTIN) 300 MG capsule Take 1 capsule by mouth at bedtime 03/01/23   Marcine Matar, MD  glucose blood (ACCU-CHEK GUIDE) test strip Use as instructed to check blood sugar twice a day 02/22/23   Marcine Matar, MD  insulin glargine (LANTUS SOLOSTAR) 100 UNIT/ML Solostar Pen Inject 10 Units into the skin at bedtime. 05/12/22   Marcine Matar, MD  Insulin Pen Needle 32G X 4 MM MISC 1 each by Does not apply route in the morning and at bedtime. 10/01/21   Anders Simmonds, PA-C  losartan (COZAAR) 50 MG tablet Take 1 tablet (50 mg total) by mouth daily. 08/19/22   Marcine Matar, MD  meloxicam (MOBIC) 15 MG tablet Take 1 tablet (15 mg total) by mouth daily. 03/15/23   Hyatt, Max T, DPM  metFORMIN (GLUCOPHAGE) 1000 MG tablet Take 1 tablet (1000mg ) by mouth in the morning and 1/2 tablet (500mg ) by mouth in the evening. 08/19/22   Marcine Matar, MD  metroNIDAZOLE (FLAGYL) 500 MG tablet Take 1 tablet (500 mg total) by mouth 2 (two) times daily. 02/23/23   Marcine Matar, MD  naproxen (NAPROSYN) 500 MG tablet Take 1 tablet (500 mg total) by mouth 2 (two) times daily as needed (Take with food). 02/22/23   Marcine Matar, MD  Vitamin D, Ergocalciferol, (DRISDOL) 1.25 MG (50000 UNIT) CAPS capsule Take 1 capsule (50,000 Units total) by mouth every 7 (seven) days. 06/03/21   Marcine Matar, MD      Allergies    Oatmeal, Rice, Wheat, Farxiga [dapagliflozin], and Lisinopril    Review of Systems   Review of Systems  HENT:  Positive for ear pain, sore throat, trouble swallowing and voice change.   All other systems reviewed and are negative.   Physical Exam Updated Vital Signs BP (!) 160/87   Pulse 63   Temp 97.9 F (36.6 C)   Resp 16   Ht 5\' 2"   (1.575 m)   Wt 69.9 kg   LMP 01/14/2015   SpO2 95%   BMI 28.19 kg/m  Physical Exam Vitals and nursing note reviewed.  Constitutional:      General: She is not in acute distress.    Appearance: She is well-developed. She is not ill-appearing or toxic-appearing.  HENT:     Head: Normocephalic and atraumatic.     Right Ear: Tympanic membrane normal.     Left Ear: Tympanic membrane normal. Tenderness present.     Nose: No congestion.     Mouth/Throat:     Mouth: Mucous membranes are moist.     Pharynx: Posterior oropharyngeal erythema present.     Tonsils: No tonsillar exudate or tonsillar abscesses. 1+ on the right. 1+ on the left.     Comments: 1+ tonsillar swelling without exudates.  There is no evidence of a PTA.  The floor of the mouth is soft without swelling.  Poor dentition.  She has a loose lower incisor as well as a dental carry of left lower molar that is crooked.  She has significant tenderness to palpation of the left lower mandible as well as left neck.  There are some mild swelling to the face.  There is no stridor.  She does have change in phonation. Eyes:     General: No scleral icterus.    Conjunctiva/sclera: Conjunctivae normal.     Pupils: Pupils are equal, round, and reactive to light.  Pulmonary:     Effort: Pulmonary effort is normal. No respiratory distress.     Breath sounds: No stridor.  Musculoskeletal:     Cervical back: Normal range of motion.  Lymphadenopathy:     Cervical: Cervical adenopathy present.  Skin:    Findings: No rash.  Neurological:     General: No focal deficit present.     Mental Status: She is alert.  Psychiatric:        Mood and Affect: Mood normal.        Behavior: Behavior normal.        Thought Content: Thought content normal.        Judgment: Judgment normal.     ED Results / Procedures / Treatments   Labs (all labs ordered are listed, but only abnormal results are displayed) Labs Reviewed  CBG MONITORING, ED - Abnormal;  Notable for the following components:      Result Value   Glucose-Capillary 183 (*)    All other components within normal limits  I-STAT CHEM 8, ED - Abnormal; Notable for the following components:   BUN 22 (*)    Glucose, Bld 201 (*)    All other components within normal  limits  GROUP A STREP BY PCR    EKG None  Radiology CT Soft Tissue Neck W Contrast  Result Date: 04/08/2023 CLINICAL DATA:  Soft tissue infection suspected, neck, xray done EXAM: CT NECK WITH CONTRAST TECHNIQUE: Multidetector CT imaging of the neck was performed using the standard protocol following the bolus administration of intravenous contrast. RADIATION DOSE REDUCTION: This exam was performed according to the departmental dose-optimization program which includes automated exposure control, adjustment of the mA and/or kV according to patient size and/or use of iterative reconstruction technique. CONTRAST:  75mL OMNIPAQUE IOHEXOL 350 MG/ML SOLN COMPARISON:  None Available. FINDINGS: Pharynx and larynx: Bilateral tonsils are normal in appearance. The epiglottis is normal in appearance. The parapharyngeal space is normal in appearance. There is severe odontogenic disease with multiple large dental caries and periapical lucencies. No drainable fluid collection is visualized. Salivary glands: No inflammation, mass, or stone. Thyroid: Normal. Lymph nodes: None enlarged or abnormal density. Vascular: Negative. Limited intracranial: Negative. Visualized orbits: Negative. Mastoids and visualized paranasal sinuses: No middle ear or mastoid effusion. Paranasal sinuses are notable for mild mucosal thickening in the right maxillary sinus. Orbits are unremarkable. Skeleton: No acute or aggressive process. Upper chest: Negative. Other: None. 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 No drainable fluid collection is visualized. Electronically Signed   By: Lorenza Cambridge M.D.   On:  04/08/2023 11:28    Procedures Procedures   Medications Ordered in ED Medications  dexamethasone (DECADRON) injection 10 mg (10 mg Intravenous Given 04/08/23 0841)  lidocaine (XYLOCAINE) 2 % viscous mouth solution 15 mL (15 mLs Mouth/Throat Given 04/08/23 0837)  oxyCODONE-acetaminophen (PERCOCET/ROXICET) 5-325 MG per tablet 1 tablet (1 tablet Oral Given 04/08/23 0837)  iohexol (OMNIPAQUE) 350 MG/ML injection 75 mL (75 mLs Intravenous Contrast Given 04/08/23 1058)    ED Course/ Medical Decision Making/ A&P   {    Medical Decision Making Amount and/or Complexity of Data Reviewed Radiology: ordered.  Risk Prescription drug management.   Initial Impression and Ddx 57 year old female who presents to the emergency department with sore throat Patient PMH that increases complexity of ED encounter: Hypertension, diabetes Differential: Strep pharyngitis, viral pharyngitis, tonsillitis, PTA, RPA, Ludwig's angina, acute otitis media dental abscess, etc.  Interpretation of Diagnostics I independent reviewed and interpreted the labs as followed: Strep negative i-STAT stable  - I independently visualized the following imaging with scope of interpretation limited to determining acute life threatening conditions related to emergency care: CT soft tissue neck, which revealed no acute findings  Patient Reassessment and Ultimate Disposition/Management 57 year old female who presents to the emergency department with sore throat.  On exam she does have some mild facial swelling to the left and significant tenderness of the left lower mandible and neck.  She does have change in phonation but there is no evidence of a PTA.  Symptoms are not really consistent with RPA.  The floor of the mouth is soft so I have less concern over Ludwig's angina.  Will obtain a CT soft tissue neck to rule out a deep space abscess.  Will also give her pain control, steroids and viscous lidocaine while waiting on  imaging.  I-STAT labs are within normal limits.  Strep is negative. CT soft tissue does not show any fluid collection.  She does have multiple dental lucencies.  Likely ongoing dental caries.  I think that she has some local facial swelling to known dental infections.  She feels okay on reassessment.  Will treat  her with penicillin.  Will give her short course of opioid pain control.  She does have [facility].  Additionally am going to give her a dental resource guide if this falls through for some reason.  She is given return precautions for worsening symptoms.  Otherwise feel that she is appropriate for discharge at this time.  The patient has been appropriately medically screened and/or stabilized in the ED. I have low suspicion for any other emergent medical condition which would require further screening, evaluation or treatment in the ED or require inpatient management. At time of discharge the patient is hemodynamically stable and in no acute distress. I have discussed work-up results and diagnosis with patient and answered all questions. Patient is agreeable with discharge plan. We discussed strict return precautions for returning to the emergency department and they verbalized understanding.     Patient management required discussion with the following services or consulting groups:  None  Complexity of Problems Addressed Chronic illness with exacerbation  Additional Data Reviewed and Analyzed Further history obtained from: Past medical history and medications listed in the EMR and Care Everywhere  Patient Encounter Risk Assessment Prescriptions, SDOH impact on management, and Use of parenteral controlled substances  Final Clinical Impression(s) / ED Diagnoses Final diagnoses:  Dental infection    Rx / DC Orders ED Discharge Orders          Ordered    penicillin v potassium (VEETID) 500 MG tablet  4 times daily        04/08/23 1137     oxyCODONE-acetaminophen (PERCOCET/ROXICET) 5-325 MG tablet  Every 6 hours PRN        04/08/23 1137              Cristopher Peru, PA-C 04/08/23 1139    Derwood Kaplan, MD 04/09/23 1254

## 2023-04-08 NOTE — Progress Notes (Signed)
Visited with patient.  Provided  [email or web address] support and presence. Chaplain available as needed.  Venida Jarvis, [place], [facility], Pager  [phone]

## 2023-04-08 NOTE — ED Triage Notes (Signed)
Pt c/o sore throat and left ear painx4d. Pt states it's feels like theres an abscess under left side of gum

## 2023-04-14 DIAGNOSIS — Z419 Encounter for procedure for purposes other than remedying health state, unspecified: Secondary | ICD-10-CM | POA: Diagnosis not present

## 2023-04-18 ENCOUNTER — Other Ambulatory Visit: Payer: Self-pay | Admitting: Internal Medicine

## 2023-04-18 DIAGNOSIS — I10 Essential (primary) hypertension: Secondary | ICD-10-CM

## 2023-04-18 NOTE — Telephone Encounter (Signed)
Medication Refill - Medication: amLODipine (NORVASC) 10 MG tablet Pt will be leaving to go out of town on Wednesday 04/20/2023 and will not be back until the 1st of September.   Has the patient contacted their pharmacy? Yes.   Pharmacy advised pt to call in and request refill, per pharmacy pt has 0 refills left.   Preferred Pharmacy (with phone number or street name): [id number] - [place] - [address]  Phone: [phone] Fax: [phone]  Has the patient been seen for an appointment in the last year OR does the patient have an upcoming appointment? Yes.    Agent: Please be advised that RX refills may take up to 3 business days. We ask that you follow-up with your pharmacy.

## 2023-04-19 NOTE — Telephone Encounter (Signed)
Requested Prescriptions  Pending Prescriptions Disp Refills   amLODipine (NORVASC) 10 MG tablet [Pharmacy Med Name: amLODIPine Besylate 10 MG Oral Tablet] 90 tablet 0    Sig: Take 1 tablet by mouth once daily     Cardiovascular: Calcium Channel Blockers 2 Failed - 04/18/2023 10:47 AM      Failed - Last BP in normal range    BP Readings from Last 1 Encounters:  04/08/23 (!) 160/87         Passed - Last Heart Rate in normal range    Pulse Readings from Last 1 Encounters:  04/08/23 63         Passed - Valid encounter within last 6 months    Recent Outpatient Visits           1 month ago Subacute vaginitis   [place] [facility] [place], Gavin Pound B, MD   3 months ago 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, macular edema presence unspecified, unspecified retinopathy severity, unspecified whether long term insulin use (HCC)   [place] [facility] Jonah Blue B, MD   7 months ago 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, macular edema presence unspecified, unspecified retinopathy severity, unspecified whether long term insulin use [facility])   [employer] [facility] Jonah Blue B, MD   11 months ago Hypertension associated with diabetes [facility])   [place] [facility] [place], Jeannett Senior L, RPH-CPP   11 months ago 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

## 2023-04-26 ENCOUNTER — Ambulatory Visit: Payer: Commercial Managed Care - HMO | Admitting: Podiatry

## 2023-05-05 ENCOUNTER — Ambulatory Visit: Payer: Commercial Managed Care - HMO | Admitting: Internal Medicine

## 2023-05-15 DIAGNOSIS — Z419 Encounter for procedure for purposes other than remedying health state, unspecified: Secondary | ICD-10-CM | POA: Diagnosis not present

## 2023-05-28 ENCOUNTER — Encounter (HOSPITAL_COMMUNITY): Payer: Self-pay

## 2023-05-28 ENCOUNTER — Emergency Department (HOSPITAL_COMMUNITY)
Admission: EM | Admit: 2023-05-28 | Discharge: 2023-05-28 | Disposition: A | Discharge status: Home / Self Care | Payer: Commercial Managed Care - HMO | Attending: Emergency Medicine | Admitting: Emergency Medicine

## 2023-05-28 ENCOUNTER — Emergency Department (HOSPITAL_COMMUNITY): Payer: Commercial Managed Care - HMO

## 2023-05-28 ENCOUNTER — Other Ambulatory Visit: Payer: Self-pay

## 2023-05-28 DIAGNOSIS — E119 Type 2 diabetes mellitus without complications: Secondary | ICD-10-CM | POA: Insufficient documentation

## 2023-05-28 DIAGNOSIS — Z7984 Long term (current) use of oral hypoglycemic drugs: Secondary | ICD-10-CM | POA: Diagnosis not present

## 2023-05-28 DIAGNOSIS — R059 Cough, unspecified: Secondary | ICD-10-CM | POA: Diagnosis present

## 2023-05-28 DIAGNOSIS — Z20822 Contact with and (suspected) exposure to covid-19: Secondary | ICD-10-CM | POA: Insufficient documentation

## 2023-05-28 DIAGNOSIS — B349 Viral infection, unspecified: Secondary | ICD-10-CM | POA: Insufficient documentation

## 2023-05-28 DIAGNOSIS — I10 Essential (primary) hypertension: Secondary | ICD-10-CM | POA: Diagnosis not present

## 2023-05-28 DIAGNOSIS — Z794 Long term (current) use of insulin: Secondary | ICD-10-CM | POA: Insufficient documentation

## 2023-05-28 DIAGNOSIS — Z79899 Other long term (current) drug therapy: Secondary | ICD-10-CM | POA: Insufficient documentation

## 2023-05-28 LAB — CBG MONITORING, ED: Glucose-Capillary: 208 mg/dL — ABNORMAL HIGH (ref 70–99)

## 2023-05-28 LAB — SARS CORONAVIRUS 2 BY RT PCR: SARS Coronavirus 2 by RT PCR: NEGATIVE

## 2023-05-28 MED ORDER — GUAIFENESIN 200 MG PO TABS: 200.0000 mg | ORAL_TABLET | ORAL | 0 refills | Status: DC | PRN

## 2023-05-28 MED ORDER — ACETAMINOPHEN 500 MG PO TABS: 500.0000 mg | ORAL_TABLET | Freq: Four times a day (QID) | ORAL | 0 refills | Status: DC | PRN

## 2023-05-28 MED ORDER — BENZONATATE 100 MG PO CAPS: 100.0000 mg | ORAL_CAPSULE | Freq: Three times a day (TID) | ORAL | 0 refills | Status: DC

## 2023-05-28 NOTE — ED Triage Notes (Signed)
Patient complains of chills, headache, cough when lying and nasal congestion x 36 hours. Denies fever. Patient is alert and oriented and NAD

## 2023-05-28 NOTE — ED Provider Notes (Signed)
[facility] EMERGENCY DEPARTMENT AT [facility] Provider Note   CSN: [id number] Arrival date & time: 05/28/23  5188     History  No chief complaint on file.   Tamara Barnes is a 57 y.o. female.  The history is provided by the patient and medical records. No language interpreter was used.     57 year old female significant history of hypertension, diabetes, uterine fibroid, presenting with cold symptoms.  Patient states for the past 2 to 3 days she has had chills, congestion, throat irritation, nonproductive cough, and not feeling well.  No complaints of significant shortness of breath, fever, body aches, nausea vomiting diarrhea or dysuria.  She tries using over-the-counter medication without relief.  She denies any recent sick contact.  Home Medications Prior to Admission medications   Medication Sig Start Date End Date Taking? Authorizing Provider  Accu-Chek Softclix Lancets lancets Use as instructed to check blood sugar twice a day 02/22/23   Marcine Matar, MD  amLODipine (NORVASC) 10 MG tablet Take 1 tablet by mouth once daily 04/19/23   Marcine Matar, MD  benzonatate (TESSALON) 100 MG capsule Take 1 capsule (100 mg total) by mouth 2 (two) times daily as needed for cough. 01/03/23   Marcine Matar, MD  Blood Glucose Monitoring Suppl (ACCU-CHEK GUIDE) w/Device KIT Use to check blood sugar twice a day. 02/22/23   Marcine Matar, MD  Blood Pressure Monitoring (BLOOD PRESSURE KIT) DEVI Use to check blood pressure once daily. 05/24/22   Marcine Matar, MD  fluconazole (DIFLUCAN) 150 MG tablet Take 1 tablet (150 mg total) by mouth daily. 02/22/23   Marcine Matar, MD  gabapentin (NEURONTIN) 300 MG capsule Take 1 capsule by mouth at bedtime 03/01/23   Marcine Matar, MD  glucose blood (ACCU-CHEK GUIDE) test strip Use as instructed to check blood sugar twice a day 02/22/23   Marcine Matar, MD  insulin glargine (LANTUS SOLOSTAR) 100 UNIT/ML Solostar Pen  Inject 10 Units into the skin at bedtime. 05/12/22   Marcine Matar, MD  Insulin Pen Needle 32G X 4 MM MISC 1 each by Does not apply route in the morning and at bedtime. 10/01/21   Anders Simmonds, PA-C  losartan (COZAAR) 50 MG tablet Take 1 tablet (50 mg total) by mouth daily. 08/19/22   Marcine Matar, MD  meloxicam (MOBIC) 15 MG tablet Take 1 tablet (15 mg total) by mouth daily. 03/15/23   Hyatt, Max T, DPM  metFORMIN (GLUCOPHAGE) 1000 MG tablet Take 1 tablet (1000mg ) by mouth in the morning and 1/2 tablet (500mg ) by mouth in the evening. 08/19/22   Marcine Matar, MD  metroNIDAZOLE (FLAGYL) 500 MG tablet Take 1 tablet (500 mg total) by mouth 2 (two) times daily. 02/23/23   Marcine Matar, MD  naproxen (NAPROSYN) 500 MG tablet Take 1 tablet (500 mg total) by mouth 2 (two) times daily as needed (Take with food). 02/22/23   Marcine Matar, MD  oxyCODONE-acetaminophen (PERCOCET/ROXICET) 5-325 MG tablet Take 1 tablet by mouth every 6 (six) hours as needed for severe pain. 04/08/23   Cristopher Peru, PA-C  Vitamin D, Ergocalciferol, (DRISDOL) 1.25 MG (50000 UNIT) CAPS capsule Take 1 capsule (50,000 Units total) by mouth every 7 (seven) days. 06/03/21   Marcine Matar, MD      Allergies    Oatmeal, Rice, Wheat, Farxiga [dapagliflozin], and Lisinopril    Review of Systems   Review of Systems  All other  systems reviewed and are negative.   Physical Exam Updated Vital Signs BP (!) 154/77   Pulse 68   Temp 98 F (36.7 C) (Oral)   Resp 16   LMP 01/14/2015   SpO2 100%  Physical Exam Vitals and nursing note reviewed.  Constitutional:      General: She is not in acute distress.    Appearance: She is well-developed.  HENT:     Head: Atraumatic.     Nose: Nose normal.     Mouth/Throat:     Mouth: Mucous membranes are moist.  Eyes:     Conjunctiva/sclera: Conjunctivae normal.  Cardiovascular:     Rate and Rhythm: Normal rate and regular rhythm.  Pulmonary:     Effort:  Pulmonary effort is normal.     Breath sounds: Rales present. No wheezing or rhonchi.  Abdominal:     Palpations: Abdomen is soft.     Tenderness: There is no abdominal tenderness.  Musculoskeletal:     Cervical back: Normal range of motion and neck supple. No rigidity or tenderness.     Right lower leg: No edema.     Left lower leg: No edema.  Skin:    Findings: No rash.  Neurological:     Mental Status: She is alert.  Psychiatric:        Mood and Affect: Mood normal.     ED Results / Procedures / Treatments   Labs (all labs ordered are listed, but only abnormal results are displayed) Labs Reviewed  CBG MONITORING, ED - Abnormal; Notable for the following components:      Result Value   Glucose-Capillary 208 (*)    All other components within normal limits  SARS CORONAVIRUS 2 BY RT PCR    EKG None  Radiology DG Chest 2 View  Result Date: 05/28/2023 CLINICAL DATA:  cough EXAM: CHEST - 2 VIEW COMPARISON:  04/07/2020. FINDINGS: The heart size and mediastinal contours are within normal limits. Both lungs are clear. No pneumothorax or pleural effusion. There are thoracic degenerative changes. IMPRESSION: No acute cardiopulmonary disease. Electronically Signed   By: Layla Maw M.D.   On: 05/28/2023 12:09    Procedures Procedures    Medications Ordered in ED Medications - No data to display  ED Course/ Medical Decision Making/ A&P                                 Medical Decision Making Amount and/or Complexity of Data Reviewed Radiology: ordered.   BP 126/81   Pulse 70   Temp 98 F (36.7 C) (Oral)   Resp 16   LMP 01/14/2015   SpO2 100%   54:48 AM  57 year old female significant history of hypertension, diabetes, uterine fibroid, presenting with cold symptoms.  Patient states for the past 2 to 3 days she has had chills, congestion, throat irritation, nonproductive cough, and not feeling well.  No complaints of significant shortness of breath, fever, body  aches, nausea vomiting diarrhea or dysuria.  She tries using over-the-counter medication without relief.  She denies any recent sick contact.  On exam, this is a well-appearing female appears to be in no acute discomfort.  Ear nose and throat exam unremarkable.  Lungs with some crackles to the lower lung bases but no wheezes or rhonchi.  Abdomen soft nontender no peripheral edema noted to lower extremities  Vital signs overall reassuring no fever no hypoxia.  -Labs ordered, independently  viewed and interpreted by me.  Labs remarkable for covid negative.  CBG 208 -The patient was maintained on a cardiac monitor.  I personally viewed and interpreted the cardiac monitored which showed an underlying rhythm of: NSR -Imaging independently viewed and interpreted by me and I agree with radiologist's interpretation.  Result remarkable for CXR without acute finding -This patient presents to the ED for concern of cold sxs, this involves an extensive number of treatment options, and is a complaint that carries with it a high risk of complications and morbidity.  The differential diagnosis includes covid, flu, rsv, pna, viral illness -Co morbidities that complicate the patient evaluation includes DM, HTN -Treatment includes supportive care -Reevaluation of the patient after these medicines showed that the patient improved -PCP office notes or outside notes reviewed -Escalation to admission/observation considered: patients feels much better, is comfortable with discharge, and will follow up with PCP -Prescription medication considered, patient comfortable with tessalon, tylenol, guaifenesin  -Social Determinant of Health considered which includes tobacco         Final Clinical Impression(s) / ED Diagnoses Final diagnoses:  Viral illness    Rx / DC Orders ED Discharge Orders          Ordered    benzonatate (TESSALON) 100 MG capsule  Every 8 hours        05/28/23 1249    acetaminophen (TYLENOL)  500 MG tablet  Every 6 hours PRN        05/28/23 1249    guaiFENesin 200 MG tablet  Every 4 hours PRN        05/28/23 1249              Fayrene Helper, PA-C 05/28/23 1251    Arby Barrette, MD 05/28/23 1625

## 2023-05-31 ENCOUNTER — Ambulatory Visit: Payer: Commercial Managed Care - HMO

## 2023-05-31 ENCOUNTER — Ambulatory Visit: Payer: Commercial Managed Care - HMO | Admitting: Podiatry

## 2023-06-09 ENCOUNTER — Encounter: Payer: Self-pay | Admitting: Podiatry

## 2023-06-09 ENCOUNTER — Ambulatory Visit (INDEPENDENT_AMBULATORY_CARE_PROVIDER_SITE_OTHER): Payer: Commercial Managed Care - HMO | Admitting: Podiatry

## 2023-06-09 ENCOUNTER — Other Ambulatory Visit: Payer: Self-pay

## 2023-06-09 DIAGNOSIS — M722 Plantar fascial fibromatosis: Secondary | ICD-10-CM

## 2023-06-09 MED ORDER — METHYLPREDNISOLONE 4 MG PO TBPK
ORAL_TABLET | ORAL | 0 refills | Status: DC
  Filled 2023-06-09: qty 21, 6d supply, fill #0

## 2023-06-09 MED ORDER — TRIAMCINOLONE ACETONIDE 40 MG/ML IJ SUSP
40.0000 mg | Freq: Once | INTRAMUSCULAR | Status: AC
Start: 2023-06-09 — End: 2023-06-09
  Administered 2023-06-09: 40 mg

## 2023-06-09 NOTE — Progress Notes (Signed)
But her left heel is back hurting again and her right heel has just started.Mobic I will refill his there is a she presents today states that not only does her big toe on her right foot hurt.  She is also here to see Nicki Gu[place] today for diabetic shoes.  Objective: Also stable and oriented x 3 severe pes planovalgus bilateral.  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  Pain on palpation medial calcaneal tubercle bilateral consistent with plantar fasciitis.  Assessment: Diabetes with diabetic peripheral neuropathy angiopathy.  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  Plantar fasciitis bilateral.  Plan: Discussed etiology pathology and surgical therapies at this point she states that she does not want any injections.  She would like to have a steroid Dosepak she knows that she is going have to adjust her insulin for this.  So we did go ahead and send over methylprednisolone to be followed by meloxicam for Which she already has.  She was sent to the orthotic department who she will be casted for diabetic shoes.  Follow-up with her once those come in

## 2023-06-12 ENCOUNTER — Other Ambulatory Visit: Payer: Self-pay | Admitting: Internal Medicine

## 2023-06-12 DIAGNOSIS — I10 Essential (primary) hypertension: Secondary | ICD-10-CM

## 2023-06-13 NOTE — Telephone Encounter (Signed)
Requested Prescriptions  Pending Prescriptions Disp Refills   losartan (COZAAR) 50 MG tablet [Pharmacy Med Name: Losartan Potassium 50 MG Oral Tablet] 90 tablet 0    Sig: Take 1 tablet by mouth once daily     Cardiovascular:  Angiotensin Receptor Blockers Failed - 06/12/2023 10:41 AM      Failed - Last BP in normal range    BP Readings from Last 1 Encounters:  05/28/23 (!) 140/89         Passed - Cr in normal range and within 180 days    Creatinine, Ser  Date Value Ref Range Status  04/08/2023 0.80 0.44 - 1.00 mg/dL Final         Passed - K in normal range and within 180 days    Potassium  Date Value Ref Range Status  04/08/2023 4.6 3.5 - 5.1 mmol/L Final         Passed - Patient is not pregnant      Passed - Valid encounter within last 6 months    Recent Outpatient Visits           3 months ago Subacute vaginitis   [facility] Jonah Blue B, MD   5 months ago 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, macular edema presence unspecified, unspecified retinopathy severity, unspecified whether long term insulin use (HCC)   [facility] Jon[facility] Blue B, MD   9 months ago 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, macular edema presence unspecified, unspecified retinopathy severity, unspecified whether long term insulin use [facility])   [facility] Jonah Blue B, MD   1 year ago Hypertension associated with diabetes [facility])   [facility] [place], [place] L, RPH-CPP   1 year ago 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

## 2023-06-14 DIAGNOSIS — Z419 Encounter for procedure for purposes other than remedying health state, unspecified: Secondary | ICD-10-CM | POA: Diagnosis not present

## 2023-06-15 ENCOUNTER — Ambulatory Visit: Payer: Managed Care, Other (non HMO)

## 2023-06-15 NOTE — Progress Notes (Signed)
Patient presents to the office today for diabetic shoe and insole measuring.  Patient was measured with brannock device to determine size and width for 1 pair of extra depth shoes and foam casted for 3 pair of insoles.   Documentation of medical necessity will be sent to patient's treating diabetic doctor to verify and sign.   Patient's diabetic provider: Jonah Blue MD   Shoes and insoles will be ordered at that time and patient will be notified for an appointment for fitting when they arrive.   Shoe size (per patient): 8 Brannock measurement: 8.5 Patient shoe selection- Shoe choice:   [id number] / [id number]  Shoe size ordered: 8.66[employer] signed

## 2023-06-20 ENCOUNTER — Other Ambulatory Visit: Payer: Self-pay

## 2023-06-23 ENCOUNTER — Telehealth: Payer: Self-pay | Admitting: Internal Medicine

## 2023-06-23 NOTE — Telephone Encounter (Signed)
Pt is also calling to see if see can schedule a sooner hospital follow up than July 18, 2023. Please advise [phone]

## 2023-06-23 NOTE — Progress Notes (Signed)
Patient stopped in with mother and checked on Shoe order ST has faxed 3times to Dr. And no response yet  Patient is calling Dr. Isidore Moos and will verify that we have correct fax number  I updated patients phone# as well

## 2023-06-23 NOTE — Telephone Encounter (Signed)
Pt is calling to request that that Dr. Laural Benes can respond to the fax that was sent 3 times by [facility] for approval diabetic shoes. Please advise CB- [phone]

## 2023-06-24 NOTE — Telephone Encounter (Signed)
Patient has the earliest appointment available

## 2023-06-28 ENCOUNTER — Ambulatory Visit: Payer: Self-pay | Admitting: *Deleted

## 2023-06-28 ENCOUNTER — Telehealth (INDEPENDENT_AMBULATORY_CARE_PROVIDER_SITE_OTHER): Payer: Self-pay | Admitting: Internal Medicine

## 2023-06-28 NOTE — Telephone Encounter (Signed)
Disposition noted. Will forward to PCP to make aware.

## 2023-06-28 NOTE — Telephone Encounter (Signed)
  Chief Complaint: Toe Pain Symptoms: Great toe of right foot painful 7/10. Injured 1 month ago. Knot noted 1 week ago, on top ,dime sized, "Little bigger." Frequency: Pain started 1 week ago Pertinent Negatives: Patient denies redness, swelling ,warmth Disposition: [] ED /[] Urgent Care (no appt availability in office) / [] Appointment(In office/virtual)/ []  [place] [facility]/ [] Home Care/ [] Refused Recommended Disposition /[x] [facility]/ []  Follow-up with PCP Additional Notes: States toe will not bend, has knot and "Dark spot." [facility] as no availability at practice. States she will go to UC near her.  Reason for Disposition  Stubbed or jammed toe  [1] After 1 week AND [2] not using the toe normally  Answer Assessment - Initial Assessment Questions 1. MECHANISM: "How did the injury happen?"      Hit right great toe on edge of bed 2. ONSET: "When did the injury happen?" (Minutes or hours ago)      1 month ago 3. LOCATION: "What part of the toe is injured?" "Is the nail damaged?"      No 4. APPEARANCE of TOE INJURY: "What does the injury look like?"      Knot on the top,dime size 5. SEVERITY: "Can you use the foot normally?" "Can you walk?"      Doesn't bend 6. SIZE: For cuts, bruises, or swelling, ask: "How large is it?" (e.g., inches or centimeters;  entire toe)      No 7. PAIN: "Is there pain?" If Yes, ask: "How bad is the pain?"   (e.g., Scale 1-10; or mild, moderate, severe)     7/10 8. TETANUS: For any breaks in the skin, ask: "When was the last tetanus booster?"     NA 9. DIABETES: "Do you have a history of diabetes or poor circulation in the feet?"      10. OTHER SYMPTOMS: "Do you have any other symptoms?"        DArk spot.  Protocols used: Toe Injury-A-AH

## 2023-06-28 NOTE — Telephone Encounter (Signed)
Copied from CRM [id number]. Topic: General - Other >> Jun 28, 2023  9:27 AM [place] B wrote: Reason for CRM: pt called in says [facility] is trying to reach [place], about her diabetic shoes

## 2023-06-30 ENCOUNTER — Other Ambulatory Visit: Payer: Self-pay | Admitting: Internal Medicine

## 2023-06-30 DIAGNOSIS — E1165 Type 2 diabetes mellitus with hyperglycemia: Secondary | ICD-10-CM

## 2023-06-30 DIAGNOSIS — E11319 Type 2 diabetes mellitus with unspecified diabetic retinopathy without macular edema: Secondary | ICD-10-CM

## 2023-06-30 DIAGNOSIS — I10 Essential (primary) hypertension: Secondary | ICD-10-CM

## 2023-06-30 DIAGNOSIS — G629 Polyneuropathy, unspecified: Secondary | ICD-10-CM

## 2023-06-30 MED ORDER — METFORMIN HCL 1000 MG PO TABS
ORAL_TABLET | ORAL | 0 refills | Status: DC
Start: 2023-06-30 — End: 2024-01-17

## 2023-06-30 MED ORDER — LOSARTAN POTASSIUM 50 MG PO TABS
50.0000 mg | ORAL_TABLET | Freq: Every day | ORAL | 0 refills | Status: DC
Start: 2023-06-30 — End: 2024-01-17

## 2023-06-30 MED ORDER — MELOXICAM 15 MG PO TABS: 15.0000 mg | ORAL_TABLET | Freq: Every day | ORAL | 1 refills | Status: DC

## 2023-06-30 MED ORDER — LANTUS SOLOSTAR 100 UNIT/ML ~~LOC~~ SOPN
10.0000 [IU] | PEN_INJECTOR | Freq: Every day | SUBCUTANEOUS | 99 refills | Status: DC
Start: 2023-06-30 — End: 2023-11-04

## 2023-06-30 MED ORDER — AMLODIPINE BESYLATE 10 MG PO TABS
10.0000 mg | ORAL_TABLET | Freq: Every day | ORAL | 0 refills | Status: DC
Start: 2023-06-30 — End: 2023-11-04

## 2023-06-30 MED ORDER — GABAPENTIN 300 MG PO CAPS
300.0000 mg | ORAL_CAPSULE | Freq: Every day | ORAL | 1 refills | Status: DC
Start: 2023-06-30 — End: 2023-11-28

## 2023-06-30 NOTE — Telephone Encounter (Signed)
Medication Refill - Medication:  amLODipine (NORVASC) 10 MG tablet  gabapentin (NEURONTIN) 300 MG capsule  IBUPROFEN 800MG   insulin glargine (LANTUS SOLOSTAR) 100 UNIT/ML Solostar Pen  losartan (COZAAR) 50 MG tablet  meloxicam (MOBIC) 15 MG tablet  metFORMIN (GLUCOPHAGE) 1000 MG tablet   Has the patient contacted their pharmacy? Yes, Erica from [facility] called to request these refills.  Preferred Pharmacy (with phone number or street name):  [facility] [address], [place] - [address] [phone] [address] [place] [address]   Has the patient been seen for an appointment in the last year OR does the patient have an upcoming appointment? . Yes. CPE scheduled for 11.11.24 with PCP

## 2023-06-30 NOTE — Telephone Encounter (Signed)
Requested medications are due for refill today.  yes  Requested medications are on the active medications list.  yes  Last refill. Varied  Future visit scheduled.   yes  Notes to clinic.  Expired labs.    Requested Prescriptions  Pending Prescriptions Disp Refills   meloxicam (MOBIC) 15 MG tablet 30 tablet 3    Sig: Take 1 tablet (15 mg total) by mouth daily.     Analgesics:  COX2 Inhibitors Failed - 06/30/2023  9:43 AM      Failed - Manual Review: Labs are only required if the patient has taken medication for more than 8 weeks.      Failed - eGFR is 30 or above and within 360 days    GFR calc Af Amer  Date Value Ref Range Status  05/01/2020 123 >59 mL/min/1.73 Final    Comment:    **[facility] currently reports eGFR in compliance with the current**   recommendations of the [employer]. [facility] will   update reporting as new guidelines are published from the NKF-ASN   Task force.    GFR calc non Af Amer  Date Value Ref Range Status  05/01/2020 107 >59 mL/min/1.73 Final   eGFR  Date Value Ref Range Status  05/24/2022 85 >59 mL/min/1.73 Final         Passed - HGB in normal range and within 360 days    Hemoglobin  Date Value Ref Range Status  04/08/2023 13.6 12.0 - 15.0 g/dL Final  16/06/9603 54.0 11.1 - 15.9 g/dL Final         Passed - Cr in normal range and within 360 days    Creatinine, Ser  Date Value Ref Range Status  04/08/2023 0.80 0.44 - 1.00 mg/dL Final         Passed - HCT in normal range and within 360 days    HCT  Date Value Ref Range Status  04/08/2023 40.0 36.0 - 46.0 % Final   Hematocrit  Date Value Ref Range Status  09/02/2022 40.2 34.0 - 46.6 % Final         Passed - AST in normal range and within 360 days    AST  Date Value Ref Range Status  09/02/2022 15 0 - 40 IU/L Final         Passed - ALT in normal range and within 360 days    ALT  Date Value Ref Range Status  09/02/2022 20 0 - 32 IU/L Final         Passed -  Patient is not pregnant      Passed - Valid encounter within last 12 months    Recent Outpatient Visits           4 months ago Subacute vaginitis   [place] [facility] Jonah Blue B, MD   5 months ago 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, macular edema presence unspecified, unspecified retinopathy severity, unspecified whether long term insulin use (HCC)   [place] [facility] Jonah Blue B, MD   10 months ago 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, macular edema presence unspecified, unspecified retinopathy severity, unspecified whether long term insulin use [facility])   [place] [facility] Marcine Matar, MD   1 year ago Hypertension associated with diabetes [facility])   [place] [place], Cornelius Moras, RPH-CPP   1 year ago  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     Endocrinology:  Diabetes - Biguanides Failed - 06/30/2023  9:43 AM      Failed - HBA1C is between 0 and 7.9 and within 180 days    HbA1c, POC (controlled diabetic range)  Date Value Ref Range Status  01/03/2023 8.5 (A) 0.0 - 7.0 % Final         Failed - eGFR in normal range and within 360 days    GFR calc Af Amer  Date Value Ref Range Status  05/01/2020 123 >59 mL/min/1.73 Final    Comment:    **[facility] currently reports eGFR in compliance with the current**   recommendations  of the [employer]. [facility] will   update reporting as new guidelines are published from the NKF-ASN   Task force.    GFR calc non Af Amer  Date Value Ref Range Status  05/01/2020 107 >59 mL/min/1.73 Final   eGFR  Date Value Ref Range Status  05/24/2022 85 >59 mL/min/1.73 Final         Failed - B12 Level in normal range and within 720 days    No results found for: "VITAMINB12"       Failed - CBC within normal limits and completed in the last 12 months    WBC  Date Value Ref Range Status  09/02/2022 5.4 3.4 - 10.8 x10E3/uL Final  04/09/2020 10.2 4.0 - 10.5 K/uL Final   RBC  Date Value Ref Range Status  09/02/2022 4.58 3.77 - 5.28 x10E6/uL Final  04/09/2020 4.30 3.87 - 5.11 MIL/uL Final   Hemoglobin  Date Value Ref Range Status  04/08/2023 13.6 12.0 - 15.0 g/dL Final  16/06/9603 54.0 11.1 - 15.9 g/dL Final   HCT  Date Value Ref Range Status  04/08/2023 40.0 36.0 - 46.0 % Final   Hematocrit  Date Value Ref Range Status  09/02/2022 40.2 34.0 - 46.6 % Final   MCHC  Date Value Ref Range Status  09/02/2022 33.3 31.5 - 35.7 g/dL Final  98/07/9146 82.9 30.0 - 36.0 g/dL Final   [facility]  Date Value Ref Range Status  09/02/2022 29.3 26.6 - 33.0 pg Final  04/09/2020 29.1 26.0 - 34.0 pg Final   MCV  Date Value Ref Range Status  09/02/2022 88 79 - 97 fL Final   No results found for: "PLTCOUNTKUC", "LABPLAT", "POCPLA" RDW  Date Value Ref Range Status  09/02/2022 13.4 11.7 - 15.4 % Final         Passed - Cr in normal range and within 360 days    Creatinine, Ser  Date Value Ref Range Status  04/08/2023 0.80 0.44 - 1.00 mg/dL Final         Passed - Valid encounter within last 6 months    Recent Outpatient Visits           4 months  ago Subacute vaginitis   [place] [facility], MD   5 months ago 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, macular edema presence unspecified, unspecified retinopathy  severity, unspecified whether long term insulin use [facility])   [place] [facility] Jonah Blue B, MD   10 months ago 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, macular edema presence unspecified, unspecified retinopathy severity, unspecified whether long term insulin use [facility])   [facility] Jonah Blue B, MD   1 year ago Hypertension associated with diabetes [facility])   [place] [facility] [place], Cornelius Moras, RPH-CPP   1 year ago 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, macular edema presence unspecified, unspecified retinopathy severity, unspecified whether long term insulin use (HCC)   [place] [facility] Jonah Blue B, MD   10 months ago 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, macular edema presence unspecified, unspecified  retinopathy severity, unspecified whether long term insulin use (HCC)   [place] [facility] Jonah Blue B, MD   1 year ago Hypertension associated with diabetes [facility])   [place] [facility] [place], [place] L, RPH-CPP   1 year ago 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     Cardiovascular:  Angiotensin Receptor Blockers Failed - 06/30/2023  9:43 AM      Failed - Last BP in normal range    BP Readings from Last 1 Encounters:  05/28/23 (!) 140/89         Passed - Cr in normal range and within 180 days    Creatinine, Ser  Date Value Ref Range Status  04/08/2023 0.80 0.44 - 1.00 mg/dL Final         Passed - K in normal range and within 180 days    Potassium  Date Value Ref Range Status  04/08/2023 4.6 3.5 - 5.1 mmol/L Final         Passed - Patient is not pregnant      Passed - Valid encounter within last 6 months    Recent Outpatient Visits           4 months ago Subacute vaginitis   [place]  [facility] Blue B, MD   5 months ago 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, macular edema presence unspecified, unspecified retinopathy severity, unspecified whether long term insulin use (HCC)   [place] [facility] Jonah Blue B, MD   10 months ago 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, macular edema presence unspecified, unspecified retinopathy severity, unspecified whether long term insulin use [facility])   [place] [facility] Jonah Blue B, MD   1 year ago Hypertension associated with diabetes [facility])   [place] [facility] [place], Cornelius Moras, RPH-CPP   1 year ago 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

## 2023-06-30 NOTE — Telephone Encounter (Signed)
Requested Prescriptions  Pending Prescriptions Disp Refills   amLODipine (NORVASC) 10 MG tablet 90 tablet 0    Sig: Take 1 tablet (10 mg total) by mouth daily.     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, macular edema presence unspecified, unspecified retinopathy severity, unspecified whether long term insulin use (HCC)   [place] [facility] Jonah Blue B, MD   10 months ago 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, macular edema presence unspecified, unspecified retinopathy severity, unspecified whether long term insulin use (HCC)   [place] [facility] Jonah Blue B, MD   1 year ago Hypertension associated with diabetes [facility])   [place] [facility] [place], [place] L, RPH-CPP   1 year ago 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     Neurology: Anticonvulsants - gabapentin Passed - 06/30/2023  9:43 AM      Passed - Cr in normal range  and within 360 days    Creatinine, Ser  Date Value Ref Range Status  04/08/2023 0.80 0.44 - 1.00 mg/dL Final         Passed - Completed PHQ-2 or PHQ-9 in the last 360 days      Passed - Valid encounter within last 12 months    Recent Outpatient Visits           4 months ago Subacute vaginitis   [place] 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, macular edema presence unspecified, unspecified retinopathy severity, unspecified whether long term insulin use (HCC)   [place] [facility] Jonah Blue B, MD   10 months ago 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, macular edema presence unspecified, unspecified retinopathy severity, unspecified whether long term insulin use (HCC)   [facility] Jonah Blue B, MD   1 year ago Hypertension associated with diabetes [facility])   [place] [facility] [place], [place] L, RPH-CPP   1 year ago 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     Cardiovascular:  Angiotensin Receptor Blockers Failed - 06/30/2023  9:43 AM      Failed - Last BP in normal range    BP Readings from Last 1 Encounters:  05/28/23 (!) 140/89         Passed - Cr in normal range and within 180 days    Creatinine, Ser  Date Value Ref Range Status  04/08/2023 0.80 0.44 - 1.00 mg/dL Final         Passed - K in normal range and within 180 days    Potassium  Date Value Ref Range Status  04/08/2023 4.6 3.5 - 5.1 mmol/L Final         Passed - Patient is not pregnant      Passed - Valid encounter within last 6 months    Recent Outpatient Visits           4 months ago Subacute vaginitis   [place] [facility] Jon[facility] Blue B, MD   5 months ago Type 2 diabetes mellitus with retinopathy  of left eye, macular edema presence unspecified, unspecified retinopathy severity, unspecified whether long term insulin use (HCC)   [place] 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     Analgesics:  COX2 Inhibitors Failed - 06/30/2023  9:43 AM      Failed - Manual Review: Labs are only required if the patient has taken medication for more than 8 weeks.      Failed - eGFR is 30 or above and within 360 days    GFR calc Af Amer  Date Value Ref Range Status  05/01/2020 123 >59 mL/min/1.73 Final    Comment:    **[facility] currently reports eGFR in compliance with the current**   recommendations of the [employer]. [facility] will   update reporting as new guidelines are published from the NKF-ASN   Task force.    GFR calc non Af Amer  Date Value Ref Range Status  05/01/2020 107 >59 mL/min/1.73 Final   eGFR  Date Value Ref Range Status  05/24/2022 85 >59 mL/min/1.73 Final         Passed - HGB in normal range  and within 360 days    Hemoglobin  Date Value Ref Range Status  04/08/2023 13.6 12.0 - 15.0 g/dL Final  78/29/5621 30.8 11.1 - 15.9 g/dL Final         Passed - Cr in normal range and within 360 days    Creatinine, Ser  Date Value Ref Range Status  04/08/2023 0.80 0.44 - 1.00 mg/dL Final         Passed - HCT in normal range and within 360 days    HCT  Date Value Ref Range Status  04/08/2023 40.0 36.0 - 46.0 % Final   Hematocrit  Date Value Ref Range Status  09/02/2022 40.2 34.0 - 46.6 % Final         Passed - AST in normal range and within 360 days    AST  Date Value Ref Range Status  09/02/2022 15 0 - 40 IU/L Final         Passed - ALT in normal range and within 360 days    ALT  Date Value Ref Range Status  09/02/2022 20 0 - 32 IU/L Final         Passed - Patient is not pregnant      Passed - Valid encounter within last 12 months    Recent Outpatient Visits  4 months ago Subacute vaginitis   [place] [facility] Jonah Blue B, MD   5 months ago 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, macular edema presence unspecified, unspecified retinopathy severity, unspecified whether long term insulin use [facility])   [place] [facility] Jon[facility] Blue B, MD   10 months ago 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, macular edema presence unspecified, unspecified retinopathy severity, unspecified whether long term insulin use (HCC)   [place] [facility] Jonah Blue B, MD   1 year ago Hypertension associated with diabetes [facility])   [place] [facility] [place], Cornelius Moras, RPH-CPP   1 year ago 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     Endocrinology:  Diabetes - Biguanides Failed - 06/30/2023  9:43 AM      Failed - HBA1C is between 0 and 7.9 and within 180 days    HbA1c, POC (controlled diabetic range)  Date Value Ref Range Status  01/03/2023 8.5 (A) 0.0 - 7.0 % Final         Failed - eGFR in normal range and within 360 days    GFR calc Af Amer  Date Value Ref Range Status  05/01/2020 123 >59 mL/min/1.73 Final    Comment:    **[facility] currently reports eGFR in compliance with the current**   recommendations of the [employer]. [facility] will   update reporting as new guidelines are published from the NKF-ASN   Task force.    GFR calc non Af Amer  Date Value Ref Range Status  05/01/2020 107 >59 mL/min/1.73 Final   eGFR  Date Value Ref Range Status  05/24/2022 85 >59 mL/min/1.73 Final         Failed - B12 Level in normal range and within 720 days    No results found for: "VITAMINB12"       Failed - CBC within normal limits and completed in the last 12 months    WBC  Date Value Ref Range Status  09/02/2022 5.4 3.4 - 10.8 x10E3/uL Final  04/09/2020 10.2 4.0 - 10.5 K/uL Final   RBC  Date Value Ref Range Status  09/02/2022 4.58 3.77 - 5.28 x10E6/uL Final  04/09/2020 4.30 3.87 - 5.11 MIL/uL Final   Hemoglobin  Date Value Ref Range Status  04/08/2023 13.6 12.0 - 15.0 g/dL Final  16/06/9603 54.0 11.1 - 15.9 g/dL Final   HCT  Date Value Ref Range Status  04/08/2023 40.0 36.0 - 46.0 % Final   Hematocrit  Date Value Ref Range Status  09/02/2022 40.2 34.0 - 46.6 % Final   MCHC  Date Value Ref Range Status  09/02/2022 33.3 31.5 - 35.7 g/dL Final  98/07/9146 82.9 30.0 - 36.0 g/dL Final    [facility]  Date Value Ref  Range Status  09/02/2022 29.3 26.6 - 33.0 pg Final  04/09/2020 29.1 26.0 - 34.0 pg Final   MCV  Date Value Ref Range Status  09/02/2022 88 79 - 97 fL Final   No results found for: "PLTCOUNTKUC", "LABPLAT", "POCPLA" RDW  Date Value Ref Range Status  09/02/2022 13.4 11.7 - 15.4 % Final         Passed - Cr in normal range and within 360 days    Creatinine, Ser  Date Value Ref Range Status  04/08/2023 0.80 0.44 - 1.00 mg/dL Final         Passed - Valid encounter within last 6 months    Recent Outpatient Visits           4 months ago Subacute vaginitis   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

## 2023-07-01 NOTE — Telephone Encounter (Signed)
Form successfully faxed to [employer] on 07/01/2023.

## 2023-07-11 ENCOUNTER — Other Ambulatory Visit: Payer: Self-pay

## 2023-07-13 ENCOUNTER — Other Ambulatory Visit: Payer: Self-pay

## 2023-07-15 DIAGNOSIS — Z419 Encounter for procedure for purposes other than remedying health state, unspecified: Secondary | ICD-10-CM | POA: Diagnosis not present

## 2023-07-22 NOTE — Progress Notes (Signed)
Appt shoe fitting set 11/18 1pm

## 2023-07-25 ENCOUNTER — Encounter: Payer: Commercial Managed Care - HMO | Admitting: Internal Medicine

## 2023-07-25 ENCOUNTER — Other Ambulatory Visit: Payer: Self-pay | Admitting: Internal Medicine

## 2023-08-01 ENCOUNTER — Other Ambulatory Visit: Payer: Managed Care, Other (non HMO)

## 2023-08-14 DIAGNOSIS — Z419 Encounter for procedure for purposes other than remedying health state, unspecified: Secondary | ICD-10-CM | POA: Diagnosis not present

## 2023-08-23 ENCOUNTER — Other Ambulatory Visit: Payer: Self-pay | Admitting: Internal Medicine

## 2023-08-23 DIAGNOSIS — I10 Essential (primary) hypertension: Secondary | ICD-10-CM

## 2023-08-23 DIAGNOSIS — E1165 Type 2 diabetes mellitus with hyperglycemia: Secondary | ICD-10-CM

## 2023-08-24 ENCOUNTER — Ambulatory Visit (INDEPENDENT_AMBULATORY_CARE_PROVIDER_SITE_OTHER): Payer: Commercial Managed Care - HMO

## 2023-08-24 DIAGNOSIS — M2142 Flat foot [pes planus] (acquired), left foot: Secondary | ICD-10-CM

## 2023-08-24 DIAGNOSIS — M722 Plantar fascial fibromatosis: Secondary | ICD-10-CM

## 2023-08-24 DIAGNOSIS — E1142 Type 2 diabetes mellitus with diabetic polyneuropathy: Secondary | ICD-10-CM

## 2023-08-24 DIAGNOSIS — M2141 Flat foot [pes planus] (acquired), right foot: Secondary | ICD-10-CM

## 2023-08-24 NOTE — Progress Notes (Signed)

## 2023-08-25 ENCOUNTER — Encounter: Payer: Commercial Managed Care - HMO | Admitting: Internal Medicine

## 2023-09-14 DIAGNOSIS — Z419 Encounter for procedure for purposes other than remedying health state, unspecified: Secondary | ICD-10-CM | POA: Diagnosis not present

## 2023-10-15 DIAGNOSIS — Z419 Encounter for procedure for purposes other than remedying health state, unspecified: Secondary | ICD-10-CM | POA: Diagnosis not present

## 2023-11-04 ENCOUNTER — Other Ambulatory Visit: Payer: Self-pay | Admitting: Internal Medicine

## 2023-11-04 DIAGNOSIS — I10 Essential (primary) hypertension: Secondary | ICD-10-CM

## 2023-11-04 DIAGNOSIS — E11319 Type 2 diabetes mellitus with unspecified diabetic retinopathy without macular edema: Secondary | ICD-10-CM

## 2023-11-04 NOTE — Telephone Encounter (Signed)
 Copied from CRM [id number]. Topic: Clinical - Medication Refill >> Nov 04, 2023 11:30 AM Patsy Lager T wrote: Most Recent Primary Care Visit:  Provider: Jonah Blue B  Department[facility]  Visit Type: OFFICE VISIT  Date: 02/22/2023  Medication: amLODipine (NORVASC) 10 MG tablet  Has the patient contacted their pharmacy? No  Is this the correct pharmacy for this prescription? Yes This is the patient's preferred pharmacy:   [facility] [place] - [address] [place] [address] Phone: [phone] Fax: [phone]  Has the prescription been filled recently? Yes  Is the patient out of the medication? Yes  Has the patient been seen for an appointment in the last year OR does the patient have an upcoming appointment? Yes  Can we respond through MyChart? No  Agent: Please be advised that Rx refills may take up to 3 business days. We ask that you follow-up with your pharmacy.

## 2023-11-04 NOTE — Telephone Encounter (Signed)
 Copied from CRM [id number]. Topic: Clinical - Medication Refill >> Nov 04, 2023 11:25 AM Patsy Lager T wrote: Most Recent Primary Care Visit:  Provider: Jonah Blue B  Department[facility]  Visit Type: OFFICE VISIT  Date: 02/22/2023  Medication: insulin glargine (LANTUS SOLOSTAR) 100 UNIT/ML Solostar Pen  Has the patient contacted their pharmacy? No  Is this the correct pharmacy for this prescription? Yes   This is the patient's preferred pharmacy:  [facility] [address] [place] [address] Phone: [phone] Fax: [phone]  Has the prescription been filled recently? Yes  Is the patient out of the medication? Yes  Has the patient been seen for an appointment in the last year OR does the patient have an upcoming appointment? Yes  Can we respond through MyChart? No  Agent: Please be advised that Rx refills may take up to 3 business days. We ask that you follow-up with your pharmacy.  Karel Jarvis called from [facility] stating patient needs her meds

## 2023-11-07 MED ORDER — LANTUS SOLOSTAR 100 UNIT/ML ~~LOC~~ SOPN
10.0000 [IU] | PEN_INJECTOR | Freq: Every day | SUBCUTANEOUS | 1 refills | Status: DC
Start: 2023-11-07 — End: 2024-01-17
  Filled 2023-11-07: qty 15, 150d supply, fill #0

## 2023-11-07 MED ORDER — AMLODIPINE BESYLATE 10 MG PO TABS
10.0000 mg | ORAL_TABLET | Freq: Every day | ORAL | 0 refills | Status: DC
Start: 2023-11-07 — End: 2024-01-17
  Filled 2023-11-07: qty 90, 90d supply, fill #0

## 2023-11-07 NOTE — Telephone Encounter (Signed)
 Requested medication (s) are due for refill today -see note  Requested medication (s) are on the active medication list -yes  Future visit scheduled -no  Last refill: 06/30/23 15ml prn  Notes to clinic: Ebony called from [facility] stating patient needs her meds  Requested Prescriptions  Pending Prescriptions Disp Refills   insulin glargine (LANTUS SOLOSTAR) 100 UNIT/ML Solostar Pen 15 mL PRN    Sig: Inject 10 Units into the skin at bedtime.     Endocrinology:  Diabetes - Insulins Failed - 11/07/2023  9:58 AM      Failed - HBA1C is between 0 and 7.9 and within 180 days    HbA1c, POC (controlled diabetic range)  Date Value Ref Range Status  01/03/2023 8.5 (A) 0.0 - 7.0 % Final         Failed - Valid encounter within last 6 months    Recent Outpatient Visits           8 months ago Subacute vaginitis   [place] [facility] 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 [facility] Marcine Matar, MD   1 year ago Hypertension associated with diabetes [facility])   [place] [facility] [place]. 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 [facility] Marcine Matar, MD                 Requested Prescriptions  Pending Prescriptions Disp Refills   insulin glargine (LANTUS SOLOSTAR) 100 UNIT/ML Solostar Pen 15 mL PRN    Sig: Inject 10 Units into the skin at bedtime.     Endocrinology:  Diabetes - Insulins Failed - 11/07/2023  9:58 AM      Failed - HBA1C is between 0 and 7.9 and within 180 days    HbA1c, POC (controlled diabetic range)  Date Value Ref Range Status  01/03/2023 8.5 (A) 0.0 - 7.0 % Final         Failed - Valid encounter within last 6 months    Recent Outpatient Visits           8 months ago Subacute vaginitis   [place] [facility] [facility] [place]. 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 [facility] Marcine Matar, MD   1 year ago Hypertension associated with diabetes [facility])   [place] [facility] [place]. 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 [facility] Marcine Matar, MD

## 2023-11-07 NOTE — Telephone Encounter (Signed)
 Requested medication (s) are due for refill today: yes  Requested medication (s) are on the active medication list: yes  Last refill:  06/30/23  Future visit scheduled: no  Notes to clinic:  Unable to refill per protocol, appointment needed.      Requested Prescriptions  Pending Prescriptions Disp Refills   amLODipine (NORVASC) 10 MG tablet 90 tablet 0    Sig: Take 1 tablet (10 mg total) by mouth daily.     Cardiovascular: Calcium Channel Blockers 2 Failed - 11/07/2023  8:14 AM      Failed - Last BP in normal range    BP Readings from Last 1 Encounters:  05/28/23 (!) 140/89         Failed - Valid encounter within last 6 months    Recent Outpatient Visits           8 months ago Subacute vaginitis   [place] [facility] [place] [facility] [place]. 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 [facility] Marcine Matar, MD   1 year ago Hypertension associated with diabetes [facility])   [place] [facility] - A Dept Of [place]. 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 [facility] Marcine Matar, MD              Passed - Last Heart Rate in normal range    Pulse  Readings from Last 1 Encounters:  05/28/23 86

## 2023-11-08 ENCOUNTER — Other Ambulatory Visit: Payer: Self-pay

## 2023-11-12 DIAGNOSIS — Z419 Encounter for procedure for purposes other than remedying health state, unspecified: Secondary | ICD-10-CM | POA: Diagnosis not present

## 2023-11-21 ENCOUNTER — Ambulatory Visit: Payer: Self-pay | Admitting: Internal Medicine

## 2023-11-21 NOTE — Telephone Encounter (Signed)
 Chief Complaint: Shoulder injury/pain Symptoms: Shoulder pain, weakness Frequency: 2 weeks ago Pertinent Negatives: Patient denies chest pain, shortness of breath Disposition: [] ED /[] Urgent Care (no appt availability in office) / [x] Appointment(In office/virtual)/ []  [facility]/ [] Home Care/ [] Refused Recommended Disposition /[] [facility]/ []  Follow-up with PCP Additional Notes: Patient called to make an appt for evaluation due to recent shoulder injury 2 weeks ago. Patient fell off a stool, hit her elbow and jammed her shoulder. Patient states she has extreme pain when raising her arm above her head and it's causing weakness in her hand. Patient has been taking OTC Advil and utilizing a heating pad that helps. Appt made for further evaluation.   Copied from CRM [id number]. Topic: Clinical - Red Word Triage >> Nov 21, 2023 10:32 AM Turkey B wrote: [facility] that prompted transfer to Nurse Triage: pt called in bad pain in left shoulder Reason for Disposition  [1] MILD pain (e.g., does not interfere with normal activities) AND [2] present > 7 days  Answer Assessment - Initial Assessment Questions 1. ONSET: "When did the pain start?"     2 weeks ago 2. LOCATION: "Where is the pain located?"     Left shoulder 3. PAIN: "How bad is the pain?" (Scale 1-10; or mild, moderate, severe)   - MILD (1-3): doesn't interfere with normal activities   - MODERATE (4-7): interferes with normal activities (e.g., work or school) or awakens from sleep   - SEVERE (8-10): excruciating pain, unable to do any normal activities, unable to move arm at all due to pain     8 4. WORK OR EXERCISE: "Has there been any recent work or exercise that involved this part of the body?"     Pain is worsened when raising up 5. CAUSE: "What do you think is causing the shoulder pain?"     2 weeks ago incarcerated and slid off stool and fell backwards and landed on elbow and jammed shoulder 6. OTHER SYMPTOMS:  "Do you have any other symptoms?" (e.g., neck pain, swelling, rash, fever, numbness, weakness)     Mild swelling, joint pain, weak  Protocols used: Shoulder Pain-A-AH

## 2023-11-21 NOTE — Telephone Encounter (Signed)
 Noted appointment made for 11/28/23

## 2023-11-28 ENCOUNTER — Ambulatory Visit: Payer: Self-pay | Attending: Internal Medicine | Admitting: Internal Medicine

## 2023-11-28 ENCOUNTER — Encounter: Payer: Self-pay | Admitting: Internal Medicine

## 2023-11-28 ENCOUNTER — Other Ambulatory Visit: Payer: Self-pay

## 2023-11-28 VITALS — BP 122/79 | HR 68 | Temp 97.8°F | Ht 62.0 in | Wt 159.0 lb

## 2023-11-28 DIAGNOSIS — M79672 Pain in left foot: Secondary | ICD-10-CM

## 2023-11-28 DIAGNOSIS — M25511 Pain in right shoulder: Secondary | ICD-10-CM | POA: Diagnosis not present

## 2023-11-28 DIAGNOSIS — G8929 Other chronic pain: Secondary | ICD-10-CM

## 2023-11-28 DIAGNOSIS — M25512 Pain in left shoulder: Secondary | ICD-10-CM

## 2023-11-28 DIAGNOSIS — G629 Polyneuropathy, unspecified: Secondary | ICD-10-CM | POA: Diagnosis not present

## 2023-11-28 MED ORDER — ACETAMINOPHEN 500 MG PO TABS
500.0000 mg | ORAL_TABLET | Freq: Three times a day (TID) | ORAL | 1 refills | Status: AC | PRN
  Filled 2023-11-28: qty 60, 20d supply, fill #0

## 2023-11-28 MED ORDER — NAPROXEN 500 MG PO TABS
500.0000 mg | ORAL_TABLET | Freq: Two times a day (BID) | ORAL | 0 refills | Status: DC | PRN
Start: 2023-11-28 — End: 2024-01-17
  Filled 2023-11-28: qty 20, 10d supply, fill #0

## 2023-11-28 MED ORDER — GABAPENTIN 300 MG PO CAPS
600.0000 mg | ORAL_CAPSULE | Freq: Every day | ORAL | 1 refills | Status: AC
  Filled 2023-11-28: qty 180, 90d supply, fill #0
  Filled 2024-05-06: qty 180, 90d supply, fill #1

## 2023-11-28 NOTE — Progress Notes (Signed)
 Patient ID: Tamara Barnes, female    DOB: 1966-04-16  MRN: [id number]  CC: Shoulder Injury (Fall on L elbow but pain has radiated to shoulder X3 weeks ago)   Subjective: Tamara Barnes is a 58 y.o. female who presents for UC visit Her concerns today include:  Pt with hx of DM bilateral nonproliferative diabetic retinopathy,HTN,  tob dep, fibroids, vit D def.     Discussed the use of AI scribe software for clinical note transcription with the patient, who gave verbal consent to proceed.  History of Present Illness   The patient presents with chronic bilateral shoulder pain, worse on the left, following a fall a month ago. She was playing a game on a tablet, raised her arms in celebration, and fell backwards off a stool, hitting her left elbow.  The elbow was sore for several days after.  The pain in the left shoulder was always there but certainly got worse  after the fall and has been constant since. The pain is worse at night and with certain movements. She has been taking 2-3 300 mg gabapentin (prescribed as 300 mg one tab at bedtime for neuropathy) at night for pain relief and to aid sleep. The right shoulder pain is intermittent and she believes it may be related to previous work. Both shoulders have been problematic for about three to four months, particularly in the winter.   At the time of the fall, she was in jail.  She did have an evaluation by one of the medical staff there.  No medication was prescribed.     Patient Active Problem List   Diagnosis Date Noted   Mass of joint of left hand 03/08/2023   Pain of left hand 01/13/2023   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) 08/14/2020   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) 08/14/2020   Nuclear sclerotic cataract of both eyes 08/14/2020   Vitreomacular adhesion of both eyes 08/14/2020   Abdominal pain    E coli bacteremia    Pyelonephritis 04/07/2020   Sinusitis 07/04/2012    Elevated BP 07/04/2012   Rhinitis medicamentosa 07/04/2012   FIBROIDS, UTERUS 02/24/2009   UNSPECIFIED ABNORMAL MAMMOGRAM 02/24/2009   DYSFUNCTIONAL UTERINE BLEEDING 08/02/2008   ANEMIA 06/04/2008   SCIATICA 06/04/2008   TOBACCO DEPENDENCE 11/10/2006     Current Outpatient Medications on File Prior to Visit  Medication Sig Dispense Refill   Accu-Chek Softclix Lancets lancets Use as instructed to check blood sugar twice a day 100 each 12   amLODipine (NORVASC) 10 MG tablet Take 1 tablet (10 mg total) by mouth daily. Needs appointment prior to next refill request. 90 tablet 0   Blood Glucose Monitoring Suppl (ACCU-CHEK GUIDE) w/Device KIT Use to check blood sugar twice a day. 1 kit 0   Blood Pressure Monitoring (BLOOD PRESSURE KIT) DEVI Use to check blood pressure once daily. 1 each 0   fluconazole (DIFLUCAN) 150 MG tablet Take 1 tablet (150 mg total) by mouth daily. 1 tablet 0   glucose blood (ACCU-CHEK GUIDE) test strip Use as instructed to check blood sugar twice a day 100 each 12   insulin glargine (LANTUS SOLOSTAR) 100 UNIT/ML Solostar Pen Inject 10 Units into the skin at bedtime. 15 mL 1   Insulin Pen Needle 32G X 4 MM MISC 1 each by Does not apply route in the morning and at bedtime. 100 each 3   losartan (COZAAR) 50 MG tablet Take 1 tablet (50 mg total)  by mouth daily. 90 tablet 0   metFORMIN (GLUCOPHAGE) 1000 MG tablet Take 1 tablet (1000mg ) by mouth in the morning and 1/2 tablet (500mg ) by mouth in the evening. 135 tablet 0   Vitamin D, Ergocalciferol, (DRISDOL) 1.25 MG (50000 UNIT) CAPS capsule Take 1 capsule (50,000 Units total) by mouth every 7 (seven) days. 16 capsule 0   No current facility-administered medications on file prior to visit.    Allergies  Allergen Reactions   Oatmeal Shortness Of Breath and Other (See Comments)    Tongue swells   Rice Shortness Of Breath, Swelling and Other (See Comments)    Tongue swells   Wheat Shortness Of Breath, Swelling and Other  (See Comments)    Tongue swells   Farxiga [Dapagliflozin] Other (See Comments)    Vaginal yeast infection   Lisinopril Cough    Social History   Socioeconomic History   Marital status: Single    Spouse name: Not on file   Number of children: Not on file   Years of education: Not on file   Highest education level: Not on file  Occupational History   Not on file  Tobacco Use   Smoking status: Every Day    Current packs/day: 0.25    Types: Cigarettes   Smokeless tobacco: Never  Vaping Use   Vaping status: Never Used  Substance and Sexual Activity   Alcohol use: Yes    Comment: socially   Drug use: No   Sexual activity: Not Currently    Birth control/protection: None  Other Topics Concern   Not on file  Social History Narrative   Not on file   Social Drivers of Health   Financial Resource Strain: High Risk (11/28/2023)   Overall Financial Resource Strain (CARDIA)    Difficulty of Paying Living Expenses: Very hard  Food Insecurity: Food Insecurity Present (11/28/2023)   Hunger Vital Sign    Worried About Running Out of Food in the Last Year: Sometimes true    Ran Out of Food in the Last Year: Sometimes true  Transportation Needs: No Transportation Needs (11/28/2023)   PRAPARE - Administrator, Civil Service (Medical): No    Lack of Transportation (Non-Medical): No  Physical Activity: Insufficiently Active (11/28/2023)   Exercise Vital Sign    Days of Exercise per Week: 2 days    Minutes of Exercise per Session: 30 min  Stress: Stress Concern Present (11/28/2023)   [employer] of Occupational Health - Occupational Stress Questionnaire    Feeling of Stress : To some extent  Social Connections: Moderately Isolated (11/28/2023)   Social Connection and Isolation Panel [NHANES]    Frequency of Communication with Friends and Family: Once a week    Frequency of Social Gatherings with Friends and Family: Twice a week    Attends Religious Services: More than 4  times per year    Active Member of [employer] or Organizations: No    Attends Banker Meetings: Never    Marital Status: Never married  Intimate Partner Violence: Not At Risk (11/28/2023)   Humiliation, Afraid, Rape, and Kick questionnaire    Fear of Current or Ex-Partner: No    Emotionally Abused: No    Physically Abused: No    Sexually Abused: No    Family History  Problem Relation Age of Onset   Kidney failure Mother    Heart disease Mother    Liver disease Father    Stomach cancer Maternal Uncle 97  Colon cancer Neg Hx    Colon polyps Neg Hx    Esophageal cancer Neg Hx    Rectal cancer Neg Hx     Past Surgical History:  Procedure Laterality Date   FRACTURE SURGERY     HAND SURGERY Left    tuabl ligation     TUBAL LIGATION      ROS: Review of Systems Negative except as stated above  PHYSICAL EXAM: BP 122/79 (BP Location: Right Arm, Patient Position: Sitting, Cuff Size: Normal)   Pulse 68   Temp 97.8 F (36.6 C) (Oral)   Ht 5\' 2"  (1.575 m)   Wt 159 lb (72.1 kg)   LMP 01/14/2015   SpO2 97%   BMI 29.08 kg/m   Physical Exam  General appearance - alert, well appearing, and in no distress Mental status - normal mood, behavior, speech, dress, motor activity, and thought processes Musculoskeletal -right shoulder: Mild tenderness over the posterior joint line.  She has discomfort with passive range of motion in all directions.  Unable to hold the arm up independently to do the drop arm test.  She has to use her left hand to hold up the right arm. Left shoulder: Mild tenderness on palpation of the anterior and posterior joint line.  Mild crepitus heard with passive range of motion.  She has moderate discomfort with passive range of motion in all directions.  Drop arm test negative. 5/5 bilaterally.  Power proximally and distally 4+/5 on the right, 5/5 on the left      Latest Ref Rng & Units 04/08/2023    8:52 AM 09/02/2022    4:03 PM 05/24/2022    2:42 PM   CMP  Glucose 70 - 99 mg/dL 295   284   BUN 6 - 20 mg/dL 22   17   Creatinine 1.32 - 1.00 mg/dL 4.40   1.02   Sodium 725 - 145 mmol/L 140   146   Potassium 3.5 - 5.1 mmol/L 4.6   4.8   Chloride 98 - 111 mmol/L 108   108   CO2 20 - 29 mmol/L   23   Calcium 8.7 - 10.2 mg/dL   9.4   Total Protein 6.0 - 8.5 g/dL  7.7    Total Bilirubin 0.0 - 1.2 mg/dL  <3.6    Alkaline Phos 44 - 121 IU/L  106    AST 0 - 40 IU/L  15    ALT 0 - 32 IU/L  20     Lipid Panel     Component Value Date/Time   CHOL 192 09/02/2022 1603   TRIG 217 (H) 09/02/2022 1603   HDL 82 09/02/2022 1603   CHOLHDL 2.3 09/02/2022 1603   CHOLHDL 2.7 Ratio 06/04/2008 2136   VLDL 45 (H) 06/04/2008 2136   LDLCALC 75 09/02/2022 1603    CBC    Component Value Date/Time   WBC 5.4 09/02/2022 1603   WBC 10.2 04/09/2020 1048   RBC 4.58 09/02/2022 1603   RBC 4.30 04/09/2020 1048   HGB 13.6 04/08/2023 0852   HGB 13.4 09/02/2022 1603   HCT 40.0 04/08/2023 0852   HCT 40.2 09/02/2022 1603   PLT 194 09/02/2022 1603   MCV 88 09/02/2022 1603   MCH 29.3 09/02/2022 1603   MCH 29.1 04/09/2020 1048   MCHC 33.3 09/02/2022 1603   MCHC 32.5 04/09/2020 1048   RDW 13.4 09/02/2022 1603   LYMPHSABS 1.8 05/13/2021 0944   EOSABS 0.0 05/13/2021 0944   BASOSABS  0.0 05/13/2021 0944    ASSESSMENT AND PLAN: 1. Chronic pain of both shoulders (Primary) Patient presenting with chronic pain in both shoulders that has gotten worse after a fall 3 to 4 weeks ago.  Pain in the left is worse than the right.  On exam the right side suggest rotator cuff injury. -We will get plain x-rays of both shoulders and refer to orthopedics.  I have placed on Naprosyn to use twice a day as needed and Tylenol to use every 8 hours.  We have also increased the gabapentin to 600 mg at bedtime. - naproxen (NAPROSYN) 500 MG tablet; Take 1 tablet (500 mg total) by mouth 2 (two) times daily as needed (Take with food).  Dispense: 20 tablet; Refill: 0 - acetaminophen  (TYLENOL) 500 MG tablet; Take 1 tablet (500 mg total) by mouth every 8 (eight) hours as needed.  Dispense: 60 tablet; Refill: 1 - AMB referral to orthopedics - DG Shoulder Left; Future - DG Shoulder Right; Future     Patient was given the opportunity to ask questions.  Patient verbalized understanding of the plan and was able to repeat key elements of the plan.   This documentation was completed using Paediatric nurse.  Any transcriptional errors are unintentional.  Orders Placed This Encounter  Procedures   DG Shoulder Left   DG Shoulder Right   AMB referral to orthopedics     Requested Prescriptions   Signed Prescriptions Disp Refills   naproxen (NAPROSYN) 500 MG tablet 20 tablet 0    Sig: Take 1 tablet (500 mg total) by mouth 2 (two) times daily as needed (Take with food).   acetaminophen (TYLENOL) 500 MG tablet 60 tablet 1    Sig: Take 1 tablet (500 mg total) by mouth every 8 (eight) hours as needed.   gabapentin (NEURONTIN) 300 MG capsule 180 capsule 1    Sig: Take 2 capsules (600 mg total) by mouth at bedtime.    Return in about 3 weeks (around 12/19/2023) for chronic ds management.  Jonah Blue, MD, FACP

## 2023-12-01 ENCOUNTER — Ambulatory Visit
Admission: RE | Admit: 2023-12-01 | Discharge: 2023-12-01 | Disposition: A | Discharge status: Home / Self Care | Source: Ambulatory Visit | Attending: Internal Medicine | Admitting: Internal Medicine

## 2023-12-01 DIAGNOSIS — M25511 Pain in right shoulder: Secondary | ICD-10-CM | POA: Diagnosis not present

## 2023-12-01 DIAGNOSIS — M25512 Pain in left shoulder: Secondary | ICD-10-CM | POA: Diagnosis not present

## 2023-12-01 DIAGNOSIS — M19011 Primary osteoarthritis, right shoulder: Secondary | ICD-10-CM | POA: Diagnosis not present

## 2023-12-01 DIAGNOSIS — G8929 Other chronic pain: Secondary | ICD-10-CM

## 2023-12-01 DIAGNOSIS — M19012 Primary osteoarthritis, left shoulder: Secondary | ICD-10-CM | POA: Diagnosis not present

## 2023-12-13 ENCOUNTER — Encounter: Payer: Self-pay | Admitting: Sports Medicine

## 2023-12-13 ENCOUNTER — Other Ambulatory Visit: Payer: Self-pay

## 2023-12-13 ENCOUNTER — Ambulatory Visit (INDEPENDENT_AMBULATORY_CARE_PROVIDER_SITE_OTHER): Admitting: Sports Medicine

## 2023-12-13 DIAGNOSIS — M25512 Pain in left shoulder: Secondary | ICD-10-CM

## 2023-12-13 DIAGNOSIS — M7542 Impingement syndrome of left shoulder: Secondary | ICD-10-CM

## 2023-12-13 DIAGNOSIS — M12811 Other specific arthropathies, not elsewhere classified, right shoulder: Secondary | ICD-10-CM

## 2023-12-13 DIAGNOSIS — M25511 Pain in right shoulder: Secondary | ICD-10-CM | POA: Diagnosis not present

## 2023-12-13 DIAGNOSIS — G8929 Other chronic pain: Secondary | ICD-10-CM | POA: Diagnosis not present

## 2023-12-13 DIAGNOSIS — M19011 Primary osteoarthritis, right shoulder: Secondary | ICD-10-CM | POA: Diagnosis not present

## 2023-12-13 DIAGNOSIS — M19012 Primary osteoarthritis, left shoulder: Secondary | ICD-10-CM

## 2023-12-13 MED ORDER — CELECOXIB 100 MG PO CAPS
100.0000 mg | ORAL_CAPSULE | Freq: Two times a day (BID) | ORAL | 0 refills | Status: AC
  Filled 2023-12-13: qty 60, 30d supply, fill #0

## 2023-12-13 NOTE — Progress Notes (Signed)
 Tamara Barnes - 59 y.o. female MRN [id number]  Date of birth: [birth year]  Office Visit Note: Visit Date: 12/13/2023 PCP: Marcine Matar, MD Referred by: Marcine Matar, MD  Subjective: Chief Complaint  Patient presents with   Right Shoulder - Pain   Left Shoulder - Pain   HPI: Tamara Barnes is a pleasant 58 y.o. female who presents today for chronic bilateral shoulder pain.  She has had chronic bilateral shoulder pain for over the last year or more.  The left is worse than her right.  She has popping in the left shoulder with certain range of motion.  She has worsening of her pain with overhead range of motion.  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  She did get some relief taking Percocet just briefly when she was recovering from dental work.  She has also been placed on gabapentin and naproxen in the past by her PCP, that were not very helpful.  Pertinent ROS were reviewed with the patient and found to be negative unless otherwise specified above in HPI.   Assessment & Plan: Visit Diagnoses:  1. Chronic pain of both shoulders   2. Rotator cuff arthropathy of both shoulders   3. Impingement syndrome of left shoulder   4. Localized osteoarthritis of shoulders, bilateral    Plan: Impression is chronic bilateral shoulder pain with associated weakness and pain with activation of the rotator cuff tendons bilaterally.  The left shoulder does have some x-ray evidence of mild arthritic change with impingement symptoms.  We will get her started on Celebrex 100 mg to take twice daily as needed for the next few weeks.  I would like to get her started in formalized physical therapy to work on strengthening and stabilization of the rotator cuff tendons/shoulders.  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  I  would like to see her back in about 6 weeks to see how she responds to formalized PT and the medication.  Next steps could be consideration of a one-time subacromial joint injection versus MRI for further evaluation of the tendons and shoulder pathology.  Follow-up: Return in about 6 weeks (around 01/24/2024) for for b/l shoulders (30-min for poss proc).   Meds & Orders:  Meds ordered this encounter  Medications   celecoxib (CELEBREX) 100 MG capsule    Sig: Take 1 capsule (100 mg total) by mouth 2 (two) times daily.    Dispense:  60 capsule    Refill:  0   No orders of the defined types were placed in this encounter.    Procedures: No procedures performed      Clinical History: No specialty comments available.  She reports that she has been smoking cigarettes. She has never used smokeless tobacco.  Recent Labs    01/03/23 1205  HGBA1C 8.5*    Objective:   Vital Signs: LMP 01/14/2015   Physical Exam  Gen: Well-appearing, in no acute distress; non-toxic CV: Well-perfused. Warm.  Resp: Breathing unlabored on room air; no wheezing. Psych: Fluid speech in conversation; appropriate affect; normal thought process  Ortho Exam - Bilateral shoulders: There is no redness swelling or effusion.  There is pain near Codman's point of bilateral shoulders and in the posterior joint glenohumeral space on the left shoulder.  There is both pain  and weakness with active forward flexion and abduction.  I am able to take her to full range of motion passively albeit with some pain.  There is positive drop arm test and weakness with associated pain with all rotator cuff testing.   Imaging:  *I did personally review and interpret the left and right shoulder x-ray, 3 views, from 12/01/2023.  Agree with below findings, although left greater than right does show evidence of functional shoulder impingement with spurring off the lateral acromion and some mild calcification of the greater tuberosity of the  humerus.  DG Shoulder Right CLINICAL DATA:  Chronic right shoulder pain.  Fall 3 weeks ago.  EXAM: RIGHT SHOULDER - 2+ VIEW  COMPARISON:  None Available.  FINDINGS: Normal bone mineralization. Moderate acromioclavicular joint space narrowing and mild peripheral osteophytosis. Moderate spur extending anteriorly, inferiorly, and laterally from the lateral acromion. Mild inferior glenoid degenerative osteophytosis. No acute fracture or dislocation.  IMPRESSION: 1. Moderate acromioclavicular and mild glenohumeral osteoarthritis. 2. Moderate spur extending anteriorly, inferiorly, and laterally from the lateral acromion.  Electronically Signed   By: Neita Garnet M.D.   On: 12/01/2023 18:35 DG Shoulder Left CLINICAL DATA:  Acute on chronic pain status post fall 3 weeks ago.  EXAM: LEFT SHOULDER - 2+ VIEW  COMPARISON:  None Available.  FINDINGS: Normal bone mineralization. Normal glenohumeral and acromioclavicular alignment. Mild acromioclavicular joint space narrowing and peripheral osteophytosis. Mild distal lateral subacromial spurring. No acute fracture or dislocation.  IMPRESSION: 1. Mild acromioclavicular osteoarthritis. 2. Mild distal lateral subacromial spurring.  Electronically Signed   By: Neita Garnet M.D.   On: 12/01/2023 18:34    Past Medical/Family/Surgical/Social History: Medications & Allergies reviewed per EMR, new medications updated. Patient Active Problem List   Diagnosis Date Noted   Mass of joint of left hand 03/08/2023   Pain of left hand 01/13/2023   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) 08/14/2020   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) 08/14/2020   Nuclear sclerotic cataract of both eyes 08/14/2020   Vitreomacular adhesion of both eyes 08/14/2020   Abdominal pain    E coli bacteremia    Pyelonephritis 04/07/2020   Sinusitis 07/04/2012   Elevated BP 07/04/2012   Rhinitis  medicamentosa 07/04/2012   FIBROIDS, UTERUS 02/24/2009   UNSPECIFIED ABNORMAL MAMMOGRAM 02/24/2009   DYSFUNCTIONAL UTERINE BLEEDING 08/02/2008   ANEMIA 06/04/2008   SCIATICA 06/04/2008   TOBACCO DEPENDENCE 11/10/2006   Past Medical History:  Diagnosis Date   Diabetes mellitus without complication (HCC)    Hypertension    Family History  Problem Relation Age of Onset   Kidney failure Mother    Heart disease Mother    Liver disease Father    Stomach cancer Maternal Uncle 28   Colon cancer Neg Hx    Colon polyps Neg Hx    Esophageal cancer Neg Hx    Rectal cancer Neg Hx    Past Surgical History:  Procedure Laterality Date   FRACTURE SURGERY     HAND SURGERY Left    tuabl ligation     TUBAL LIGATION     Social History   Occupational History   Not on file  Tobacco Use   Smoking status: Every Day    Current packs/day: 0.25    Types: Cigarettes   Smokeless tobacco: Never  Vaping Use   Vaping status: Never Used  Substance and Sexual Activity   Alcohol use: Yes    Comment: socially   Drug use:  No   Sexual activity: Not Currently    Birth control/protection: None

## 2023-12-13 NOTE — Progress Notes (Signed)
 Patient says that she has had shoulder pain for about a year now, with the left worse than the right. She says that her right shoulder pain comes and goes, but her left shoulder pain is constant; she also has popping in the left shoulder. She is able to move the arms, but has pain when she tries any overhead ROM. She is right-handed. 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 She has taken OTC medication with no relief, and says that heat feels good but helps very little. She did take Percocet when recovering from dental work and said that her shoulder pain went away then, but she is no longer taking that medication.

## 2023-12-24 DIAGNOSIS — Z419 Encounter for procedure for purposes other than remedying health state, unspecified: Secondary | ICD-10-CM | POA: Diagnosis not present

## 2024-01-05 ENCOUNTER — Encounter: Payer: Self-pay | Admitting: Podiatry

## 2024-01-05 ENCOUNTER — Ambulatory Visit (INDEPENDENT_AMBULATORY_CARE_PROVIDER_SITE_OTHER)

## 2024-01-05 ENCOUNTER — Ambulatory Visit (INDEPENDENT_AMBULATORY_CARE_PROVIDER_SITE_OTHER): Admitting: Podiatry

## 2024-01-05 VITALS — Ht 62.0 in | Wt 159.0 lb

## 2024-01-05 DIAGNOSIS — S86012A Strain of left Achilles tendon, initial encounter: Secondary | ICD-10-CM

## 2024-01-05 DIAGNOSIS — M79672 Pain in left foot: Secondary | ICD-10-CM

## 2024-01-05 DIAGNOSIS — M2142 Flat foot [pes planus] (acquired), left foot: Secondary | ICD-10-CM | POA: Diagnosis not present

## 2024-01-05 NOTE — Progress Notes (Signed)
 She presents today with chief complaint of pain to her left foot.  States that a week or so ago she stepped off a curb and ever since then she has been having severe pain in the posterior aspect of the left ankle and heel.  States that her blood sugars have been high because of a death in the family but she is trying to keep them down.  States that she is using Celebrex  100 mg twice daily.  She is also using Neurontin .  Objective: Vital signs are stable she is alert oriented x 3 she has mild edema to the left lower extremity.  And has a palpable intact Achilles tendon with good plantarflexion on resistance but painful in dorsiflexion on resistance is painful.  Radiographs taken today demonstrate osseous immature foot retrocalcaneal spurs present there appears to be a thickening of the Achilles tendon just proximal to the calcaneus no fracture of the calcaneus that I can tell.  Assessment Achilles tendinitis left.  Plan: Since we cannot rule out a tear I do think that putting her in a short cam boot will be appropriate.  She will continue her gabapentin  and her Celebrex  100 mg twice daily.  Follow-up with her in 4 weeks.

## 2024-01-17 ENCOUNTER — Encounter: Payer: Self-pay | Admitting: Internal Medicine

## 2024-01-17 ENCOUNTER — Other Ambulatory Visit: Payer: Self-pay

## 2024-01-17 ENCOUNTER — Ambulatory Visit: Attending: Internal Medicine | Admitting: Internal Medicine

## 2024-01-17 VITALS — BP 135/85 | HR 65 | Temp 97.6°F | Ht 62.0 in | Wt 161.0 lb

## 2024-01-17 DIAGNOSIS — Z794 Long term (current) use of insulin: Secondary | ICD-10-CM | POA: Diagnosis not present

## 2024-01-17 DIAGNOSIS — I10 Essential (primary) hypertension: Secondary | ICD-10-CM | POA: Diagnosis not present

## 2024-01-17 DIAGNOSIS — E113293 Type 2 diabetes mellitus with mild nonproliferative diabetic retinopathy without macular edema, bilateral: Secondary | ICD-10-CM

## 2024-01-17 DIAGNOSIS — F17200 Nicotine dependence, unspecified, uncomplicated: Secondary | ICD-10-CM

## 2024-01-17 DIAGNOSIS — F17210 Nicotine dependence, cigarettes, uncomplicated: Secondary | ICD-10-CM

## 2024-01-17 DIAGNOSIS — Z2821 Immunization not carried out because of patient refusal: Secondary | ICD-10-CM

## 2024-01-17 DIAGNOSIS — E1159 Type 2 diabetes mellitus with other circulatory complications: Secondary | ICD-10-CM

## 2024-01-17 DIAGNOSIS — Z7984 Long term (current) use of oral hypoglycemic drugs: Secondary | ICD-10-CM | POA: Diagnosis not present

## 2024-01-17 LAB — POCT GLYCOSYLATED HEMOGLOBIN (HGB A1C): HbA1c, POC (controlled diabetic range): 9 % — AB (ref 0.0–7.0)

## 2024-01-17 LAB — GLUCOSE, POCT (MANUAL RESULT ENTRY): POC Glucose: 263 mg/dL — AB (ref 70–99)

## 2024-01-17 MED ORDER — PEN NEEDLES 31G X 8 MM MISC
6 refills | Status: AC
  Filled 2024-01-17: qty 100, 100d supply, fill #0
  Filled 2024-06-13: qty 100, 100d supply, fill #1

## 2024-01-17 MED ORDER — AMLODIPINE BESYLATE 10 MG PO TABS
10.0000 mg | ORAL_TABLET | Freq: Every day | ORAL | 1 refills | Status: AC
Start: 2024-01-17 — End: ?
  Filled 2024-01-17: qty 90, 90d supply, fill #0
  Filled 2024-05-06: qty 90, 90d supply, fill #1

## 2024-01-17 MED ORDER — FREESTYLE LIBRE 3 READER DEVI
0 refills | Status: DC
  Filled 2024-01-17: qty 1, 273d supply, fill #0
  Filled 2024-02-15: qty 1, 90d supply, fill #0

## 2024-01-17 MED ORDER — LANTUS SOLOSTAR 100 UNIT/ML ~~LOC~~ SOPN
10.0000 [IU] | PEN_INJECTOR | Freq: Every day | SUBCUTANEOUS | 1 refills | Status: DC
  Filled 2024-01-17: qty 15, 150d supply, fill #0
  Filled 2024-01-17: qty 9, 90d supply, fill #0

## 2024-01-17 MED ORDER — LOSARTAN POTASSIUM 50 MG PO TABS
50.0000 mg | ORAL_TABLET | Freq: Every day | ORAL | 1 refills | Status: DC
  Filled 2024-01-17: qty 90, 90d supply, fill #0
  Filled 2024-05-06: qty 90, 90d supply, fill #1

## 2024-01-17 MED ORDER — METFORMIN HCL ER 500 MG PO TB24
500.0000 mg | ORAL_TABLET | Freq: Two times a day (BID) | ORAL | 1 refills | Status: AC
  Filled 2024-01-17: qty 180, 90d supply, fill #0
  Filled 2024-06-13: qty 180, 90d supply, fill #1

## 2024-01-17 MED ORDER — FREESTYLE LIBRE 3 PLUS SENSOR MISC
12 refills | Status: AC
  Filled 2024-01-17 – 2024-02-15 (×2): qty 2, 30d supply, fill #0
  Filled 2024-03-08 – 2024-03-12 (×2): qty 2, 30d supply, fill #1
  Filled 2024-04-10: qty 2, 30d supply, fill #2
  Filled 2024-05-06: qty 2, 30d supply, fill #3
  Filled 2024-06-14: qty 2, 30d supply, fill #4
  Filled 2024-07-16: qty 2, 30d supply, fill #5
  Filled 2024-08-13 – 2024-08-14 (×2): qty 2, 30d supply, fill #6
  Filled 2024-09-14: qty 2, 30d supply, fill #7
  Filled 2024-10-18: qty 2, 30d supply, fill #8

## 2024-01-17 MED ORDER — NICOTINE 14 MG/24HR TD PT24
14.0000 mg | MEDICATED_PATCH | Freq: Every day | TRANSDERMAL | 1 refills | Status: DC
  Filled 2024-01-17: qty 28, 28d supply, fill #0

## 2024-01-17 NOTE — Progress Notes (Signed)
 Patient ID: Tamara Barnes, female    DOB: 27-Dec-1965  MRN: [id number]  CC: Diabetes (DM f/u. Med refills. /Reports not having medication for X1 mo/Reports fiance passed on 5/15/No to pneumonia vax)   Subjective: Tamara Barnes is a 58 y.o. female who presents for chronic ds management. Her concerns today include:  Pt with hx of DM bilateral nonproliferative diabetic retinopathy,HTN,  tob dep, fibroids, vit D def.     Discussed the use of AI scribe software for clinical note transcription with the patient, who gave verbal consent to proceed.  History of Present Illness Tamara Barnes is a 58 year old female with hypertension and diabetes who presents for follow-up of her blood pressure and diabetes management.  She has not taken Lantus  insulin  for two weeks due to running out and being unaware of refills. Her blood sugar is 263 mg/dL, and W0J is 8.1%. She takes metformin  500 mg twice daily instead of the prescribed 1000 mg in the morning and 500 mg in the evening due to gastrointestinal discomfort when she takes 1 gram dose. She has nonproliferative diabetic retinopathy and experiences occasional blurry vision. Over due for eye exam. She feels she does okay with eating habits.  She has eliminated sugary drinks and sugary snacks from her diet.  HTN:  Her blood pressure is elevated at 180/78 mmHg without medication. She has been out of Cozaar  50 mg and amlodipine  10 mg for two weeks after misplacing them.  No chest pains or shortness of breath.  No swelling in the legs.  She limits salt in the foods.  She has an Achilles tendon injury to LT foot and is wearing a boot. She is under the care of [facility] with a follow-up on May 21. She was placed on Celebrex .  Wanted RF on Naprosyn .  She recently lost her fianc and is trying to quit smoking, having not smoked in eight days. She previously smoked a pack a day. S  HM: Declines Prevnar 20.  Due for diabetic eye exam.  Patient  Active Problem List   Diagnosis Date Noted   Mass of joint of left hand 03/08/2023   Pain of left hand 01/13/2023   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) 08/14/2020   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) 08/14/2020   Nuclear sclerotic cataract of both eyes 08/14/2020   Vitreomacular adhesion of both eyes 08/14/2020   Abdominal pain    E coli bacteremia    Pyelonephritis 04/07/2020   Sinusitis 07/04/2012   Elevated BP 07/04/2012   Rhinitis medicamentosa 07/04/2012   FIBROIDS, UTERUS 02/24/2009   UNSPECIFIED ABNORMAL MAMMOGRAM 02/24/2009   DYSFUNCTIONAL UTERINE BLEEDING 08/02/2008   ANEMIA 06/04/2008   SCIATICA 06/04/2008   TOBACCO DEPENDENCE 11/10/2006     Current Outpatient Medications on File Prior to Visit  Medication Sig Dispense Refill   Accu-Chek Softclix Lancets lancets Use as instructed to check blood sugar twice a day 100 each 12   acetaminophen  (TYLENOL ) 500 MG tablet Take 1 tablet (500 mg total) by mouth every 8 (eight) hours as needed. 60 tablet 1   Blood Glucose Monitoring Suppl (ACCU-CHEK GUIDE) w/Device KIT Use to check blood sugar twice a day. 1 kit 0   Blood Pressure Monitoring (BLOOD PRESSURE KIT) DEVI Use to check blood pressure once daily. 1 each 0   gabapentin  (NEURONTIN ) 300 MG capsule Take 2 capsules (600 mg total) by mouth at bedtime. 180 capsule 1   glucose blood (  ACCU-CHEK GUIDE) test strip Use as instructed to check blood sugar twice a day 100 each 12   insulin  glargine (LANTUS  SOLOSTAR) 100 UNIT/ML Solostar Pen Inject 10 Units into the skin at bedtime. 15 mL 1   Insulin  Pen Needle 32G X 4 MM MISC 1 each by Does not apply route in the morning and at bedtime. 100 each 3   naproxen  (NAPROSYN ) 500 MG tablet Take 1 tablet (500 mg total) by mouth 2 (two) times daily as needed (Take with food). 20 tablet 0   amLODipine  (NORVASC ) 10 MG tablet Take 1 tablet (10 mg total) by mouth daily. Needs appointment prior to  next refill request. (Patient not taking: Reported on 01/17/2024) 90 tablet 0   losartan  (COZAAR ) 50 MG tablet Take 1 tablet (50 mg total) by mouth daily. (Patient not taking: Reported on 01/17/2024) 90 tablet 0   metFORMIN  (GLUCOPHAGE ) 1000 MG tablet Take 1 tablet (1000mg ) by mouth in the morning and 1/2 tablet (500mg ) by mouth in the evening. (Patient not taking: Reported on 01/17/2024) 135 tablet 0   No current facility-administered medications on file prior to visit.    Allergies  Allergen Reactions   Oatmeal Shortness Of Breath and Other (See Comments)    Tongue swells   Rice Shortness Of Breath, Swelling and Other (See Comments)    Tongue swells   Wheat Shortness Of Breath, Swelling and Other (See Comments)    Tongue swells   Farxiga  [Dapagliflozin ] Other (See Comments)    Vaginal yeast infection   Lisinopril  Cough    Social History   Socioeconomic History   Marital status: Single    Spouse name: Not on file   Number of children: Not on file   Years of education: Not on file   Highest education level: Not on file  Occupational History   Not on file  Tobacco Use   Smoking status: Every Day    Current packs/day: 0.25    Types: Cigarettes   Smokeless tobacco: Never  Vaping Use   Vaping status: Never Used  Substance and Sexual Activity   Alcohol use: Yes    Comment: socially   Drug use: No   Sexual activity: Not Currently    Birth control/protection: None  Other Topics Concern   Not on file  Social History Narrative   Not on file   Social Drivers of Health   Financial Resource Strain: High Risk (11/28/2023)   Overall Financial Resource Strain (CARDIA)    Difficulty of Paying Living Expenses: Very hard  Food Insecurity: Food Insecurity Present (11/28/2023)   Hunger Vital Sign    Worried About Running Out of Food in the Last Year: Sometimes true    Ran Out of Food in the Last Year: Sometimes true  Transportation Needs: No Transportation Needs (11/28/2023)   PRAPARE -  Administrator, Civil Service (Medical): No    Lack of Transportation (Non-Medical): No  Physical Activity: Insufficiently Active (11/28/2023)   Exercise Vital Sign    Days of Exercise per Week: 2 days    Minutes of Exercise per Session: 30 min  Stress: Stress Concern Present (11/28/2023)   [employer] of Occupational Health - Occupational Stress Questionnaire    Feeling of Stress : To some extent  Social Connections: Moderately Isolated (11/28/2023)   Social Connection and Isolation Panel [NHANES]    Frequency of Communication with Friends and Family: Once a week    Frequency of Social Gatherings with Friends and Family: Twice  a week    Attends Religious Services: More than 4 times per year    Active Member of Clubs or Organizations: No    Attends Banker Meetings: Never    Marital Status: Never married  Intimate Partner Violence: Not At Risk (11/28/2023)   Humiliation, Afraid, Rape, and Kick questionnaire    Fear of Current or Ex-Partner: No    Emotionally Abused: No    Physically Abused: No    Sexually Abused: No    Family History  Problem Relation Age of Onset   Kidney failure Mother    Heart disease Mother    Liver disease Father    Stomach cancer Maternal Uncle 55   Colon cancer Neg Hx    Colon polyps Neg Hx    Esophageal cancer Neg Hx    Rectal cancer Neg Hx     Past Surgical History:  Procedure Laterality Date   FRACTURE SURGERY     HAND SURGERY Left    tuabl ligation     TUBAL LIGATION      ROS: Review of Systems Negative except as stated above  PHYSICAL EXAM: BP 135/85 (BP Location: Left Arm, Patient Position: Sitting, Cuff Size: Normal)   Pulse 65   Temp 97.6 F (36.4 C) (Oral)   Ht 5\' 2"  (1.575 m)   Wt 161 lb (73 kg)   LMP 01/14/2015   SpO2 99%   BMI 29.45 kg/m   Wt Readings from Last 3 Encounters:  01/17/24 161 lb (73 kg)  01/05/24 159 lb (72.1 kg)  11/28/23 159 lb (72.1 kg)    Physical Exam  General  appearance - alert, well appearing, and in no distress Mental status - normal mood, behavior, speech, dress, motor activity, and thought processes Neck - supple, no significant adenopathy Chest - clear to auscultation, no wheezes, rales or rhonchi, symmetric air entry Heart - normal rate, regular rhythm, normal S1, S2, no murmurs, rubs, clicks or gallops Extremities - peripheral pulses normal, no pedal edema, no clubbing or cyanosis MSK: She is wearing a boot cast on the left lower leg from mid shin down and include the foot      Latest Ref Rng & Units 04/08/2023    8:52 AM 09/02/2022    4:03 PM 05/24/2022    2:42 PM  CMP  Glucose 70 - 99 mg/dL 846   962   BUN 6 - 20 mg/dL 22   17   Creatinine 9.52 - 1.00 mg/dL 8.41   3.24   Sodium 401 - 145 mmol/L 140   146   Potassium 3.5 - 5.1 mmol/L 4.6   4.8   Chloride 98 - 111 mmol/L 108   108   CO2 20 - 29 mmol/L   23   Calcium 8.7 - 10.2 mg/dL   9.4   Total Protein 6.0 - 8.5 g/dL  7.7    Total Bilirubin 0.0 - 1.2 mg/dL  <0.2    Alkaline Phos 44 - 121 IU/L  106    AST 0 - 40 IU/L  15    ALT 0 - 32 IU/L  20     Lipid Panel     Component Value Date/Time   CHOL 192 09/02/2022 1603   TRIG 217 (H) 09/02/2022 1603   HDL 82 09/02/2022 1603   CHOLHDL 2.3 09/02/2022 1603   CHOLHDL 2.7 Ratio 06/04/2008 2136   VLDL 45 (H) 06/04/2008 2136   LDLCALC 75 09/02/2022 1603    CBC  Component Value Date/Time   WBC 5.4 09/02/2022 1603   WBC 10.2 04/09/2020 1048   RBC 4.58 09/02/2022 1603   RBC 4.30 04/09/2020 1048   HGB 13.6 04/08/2023 0852   HGB 13.4 09/02/2022 1603   HCT 40.0 04/08/2023 0852   HCT 40.2 09/02/2022 1603   PLT 194 09/02/2022 1603   MCV 88 09/02/2022 1603   MCH 29.3 09/02/2022 1603   MCH 29.1 04/09/2020 1048   MCHC 33.3 09/02/2022 1603   MCHC 32.5 04/09/2020 1048   RDW 13.4 09/02/2022 1603   LYMPHSABS 1.8 05/13/2021 0944   EOSABS 0.0 05/13/2021 0944   BASOSABS 0.0 05/13/2021 0944    ASSESSMENT AND PLAN: 1. 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  She has been out of insulin  for 2 weeks and has decreased metformin  to 500 mg twice a day due to GI intolerance at higher doses.  Encouraged her to continue trying to eat healthy. - Increase Lantus  to 10 units daily at bedtime. - Adjust metformin  to 500 mg twice daily. - Refer to [facility] for retinopathy follow-up. - Order continuous glucose monitor and reader. - Prescribe diabetic testing supplies. - Follow-up with clinical pharmacist in six weeks. - POCT glycosylated hemoglobin (Hb A1C) - POCT glucose (manual entry) - insulin  glargine (LANTUS  SOLOSTAR) 100 UNIT/ML Solostar Pen; Inject 10 Units into the skin at bedtime.  Dispense: 15 mL; Refill: 1 - Ambulatory referral to Ophthalmology - CBC - Comprehensive metabolic panel with GFR - Lipid panel - Microalbumin / creatinine urine ratio - metFORMIN  (GLUCOPHAGE -XR) 500 MG 24 hr tablet; Take 1 tablet (500 mg total) by mouth 2 (two) times daily with a meal.  Dispense: 180 tablet; Refill: 1 - Insulin  Pen Needle (PEN NEEDLES) 31G X 8 MM MISC; Use as directed with insulin .  Dispense: 100 each; Refill: 6 - Continuous Glucose Sensor (FREESTYLE LIBRE 3 PLUS SENSOR) MISC; Change sensor every 15 days.  Dispense: 2 each; Refill: 12 - Continuous Glucose Receiver (FREESTYLE LIBRE 3 READER) DEVI; Use as directed.  Dispense: 1 each; Refill: 0  2. Hypertension associated with type 2 diabetes mellitus (HCC) Not at goal.  She has been out of her amlodipine  and Cozaar  x 2 weeks.  Refills sent. - amLODipine  (NORVASC ) 10 MG tablet; Take 1 tablet (10 mg total) by mouth daily.  Dispense: 90 tablet; Refill: 1 - losartan  (COZAAR ) 50 MG tablet; Take 1 tablet (50 mg total) by mouth daily.  Dispense: 90 tablet; Refill: 1  3. Tobacco dependence Commended her on not smoking in the past 8  days.  Advised to quit.  She is working on trying to quit completely.  Requests nicotine patches to assist. - nicotine (NICODERM CQ - DOSED IN MG/24 HOURS) 14 mg/24hr patch; Place 1 patch (14 mg total) onto the skin daily.  Dispense: 28 patch; Refill: 1  4. Pneumococcal vaccination declined Recommended.  Patient declined.   Patient was given the opportunity to ask questions.  Patient verbalized understanding of the plan and was able to repeat key elements of the plan.   This documentation was completed using Paediatric nurse.  Any transcriptional errors are unintentional.  Orders Placed This Encounter  Procedures   POCT glycosylated hemoglobin (Hb A1C)   POCT glucose (manual entry)     Requested Prescriptions   Pending Prescriptions Disp Refills   amLODipine  (NORVASC ) 10 MG tablet 90 tablet  1    Sig: Take 1 tablet (10 mg total) by mouth daily. Needs appointment prior to next refill request.   losartan  (COZAAR ) 50 MG tablet 90 tablet 1    Sig: Take 1 tablet (50 mg total) by mouth daily.   metFORMIN  (GLUCOPHAGE ) 1000 MG tablet 135 tablet 0    Sig: Take 1 tablet (1000mg ) by mouth in the morning and 1/2 tablet (500mg ) by mouth in the evening.   naproxen  (NAPROSYN ) 500 MG tablet 20 tablet 0    Sig: Take 1 tablet (500 mg total) by mouth 2 (two) times daily as needed (Take with food).   insulin  glargine (LANTUS  SOLOSTAR) 100 UNIT/ML Solostar Pen 15 mL 1    Sig: Inject 10 Units into the skin at bedtime.    No follow-ups on file.  Concetta Dee, MD, FACP

## 2024-01-17 NOTE — Patient Instructions (Signed)
 VISIT SUMMARY:  Today, we reviewed your blood pressure and diabetes management. We discussed your recent challenges with medication adherence and made adjustments to your treatment plan. We also addressed your Achilles tendon injury and your efforts to quit smoking.  YOUR PLAN:  -TYPE 2 DIABETES MELLITUS WITH DIABETIC RETINOPATHY: Type 2 diabetes is a condition where your body does not use insulin  properly, leading to high blood sugar levels. Diabetic retinopathy is an eye condition caused by diabetes. We have increased your Lantus  insulin  to 10 units daily at bedtime and adjusted your metformin  to 500 mg twice daily. You are also referred to [facility] for a follow-up on your retinopathy. We will order a continuous glucose monitor and reader for you, and prescribe diabetic testing supplies. Please follow up with the clinical pharmacist in six weeks and schedule a follow-up appointment in four months.  -HYPERTENSION: Hypertension, or high blood pressure, is when the force of your blood against your artery walls is too high. Your blood pressure is currently uncontrolled. We have refilled your lorazepam 50 mg and amlodipine  10 mg prescriptions. Please continue to follow a low-sodium diet.  -ACHILLES TENDON INJURY: An Achilles tendon injury involves damage to the tendon connecting your calf muscles to your heel bone. You are currently managing this with a boot and Celebrex . Please discontinue Naprosyn  while you are taking Celebrex .  -NICOTINE DEPENDENCE: Nicotine dependence is an addiction to tobacco products. You have reduced your smoking and have been abstinent for eight days. To support your efforts to quit, we have prescribed 14 mg nicotine patches.  INSTRUCTIONS:  Please follow up with [facility] for your diabetic retinopathy. You should also follow up with the clinical pharmacist in six weeks and schedule a follow-up appointment with us  in four months. Continue to manage your Achilles  tendon injury with [facility], and attend your follow-up on May 21.

## 2024-01-18 ENCOUNTER — Other Ambulatory Visit: Payer: Self-pay

## 2024-01-19 ENCOUNTER — Other Ambulatory Visit: Payer: Self-pay

## 2024-01-19 ENCOUNTER — Telehealth: Payer: Self-pay

## 2024-01-19 LAB — COMPREHENSIVE METABOLIC PANEL WITH GFR
ALT: 14 IU/L (ref 0–32)
AST: 14 IU/L (ref 0–40)
Albumin: 4.8 g/dL (ref 3.8–4.9)
Alkaline Phosphatase: 126 IU/L — ABNORMAL HIGH (ref 44–121)
BUN/Creatinine Ratio: 22 (ref 9–23)
BUN: 17 mg/dL (ref 6–24)
Bilirubin Total: <0.2 mg/dL (ref 0.0–1.2)
CO2: 19 mmol/L — ABNORMAL LOW (ref 20–29)
Calcium: 10 mg/dL (ref 8.7–10.2)
Chloride: 103 mmol/L (ref 96–106)
Creatinine, Ser: 0.78 mg/dL (ref 0.57–1.00)
Globulin, Total: 2.6 g/dL (ref 1.5–4.5)
Glucose: 249 mg/dL — ABNORMAL HIGH (ref 70–99)
Potassium: 4.4 mmol/L (ref 3.5–5.2)
Sodium: 140 mmol/L (ref 134–144)
Total Protein: 7.4 g/dL (ref 6.0–8.5)
eGFR: 88 mL/min/{1.73_m2} (ref >59)

## 2024-01-19 LAB — LIPID PANEL
Chol/HDL Ratio: 1.8 ratio (ref 0.0–4.4)
Cholesterol, Total: 195 mg/dL (ref 100–199)
HDL: 106 mg/dL (ref >39)
LDL Chol Calc (NIH): 74 mg/dL (ref 0–99)
Triglycerides: 88 mg/dL (ref 0–149)
VLDL Cholesterol Cal: 15 mg/dL (ref 5–40)

## 2024-01-19 LAB — MICROALBUMIN / CREATININE URINE RATIO
Creatinine, Urine: 141.9 mg/dL
Microalb/Creat Ratio: 25 mg/g{creat} (ref 0–29)
Microalbumin, Urine: 34.8 ug/mL

## 2024-01-19 LAB — CBC
Hematocrit: 43.7 % (ref 34.0–46.6)
Hemoglobin: 14 g/dL (ref 11.1–15.9)
MCH: 29.2 pg (ref 26.6–33.0)
MCHC: 32 g/dL (ref 31.5–35.7)
MCV: 91 fL (ref 79–97)
Platelets: 194 10*3/uL (ref 150–450)
RBC: 4.79 x10E6/uL (ref 3.77–5.28)
RDW: 12.9 % (ref 11.7–15.4)
WBC: 5.6 10*3/uL (ref 3.4–10.8)

## 2024-01-19 NOTE — Telephone Encounter (Signed)
 Pharmacy Patient Advocate Encounter  Received notification from [facility] Medicaid that Prior Authorization for FREESTYLE LIBRE 3 PLUS SENSOR has been APPROVED from 01/18/2024 to 01/17/2025   PA #/Case ID/Reference #: [id number]

## 2024-01-23 DIAGNOSIS — Z419 Encounter for procedure for purposes other than remedying health state, unspecified: Secondary | ICD-10-CM | POA: Diagnosis not present

## 2024-01-24 ENCOUNTER — Ambulatory Visit: Admitting: Sports Medicine

## 2024-01-24 NOTE — Therapy (Signed)
 OUTPATIENT PHYSICAL THERAPY SHOULDER EVALUATION   Patient Name: Tamara Barnes MRN: [id number] DOB:18-Feb-1966, 58 y.o., female Today's Date: 01/26/2024  END OF SESSION:  PT End of Session - 01/25/24 1109     Visit Number 1    Number of Visits 13    Date for PT Re-Evaluation 03/16/24    Authorization Type [place] MEDICAID [facility]    Authorization - Visit Number 1    PT Start Time 1100    PT Stop Time 1145    PT Time Calculation (min) 45 min    Activity Tolerance Patient tolerated treatment well    Behavior During Therapy WFL for tasks assessed/performed             Past Medical History:  Diagnosis Date   Diabetes mellitus without complication (HCC)    Hypertension    Past Surgical History:  Procedure Laterality Date   FRACTURE SURGERY     HAND SURGERY Left    tuabl ligation     TUBAL LIGATION     Patient Active Problem List   Diagnosis Date Noted   Mass of joint of left hand 03/08/2023   Pain of left hand 01/13/2023   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) 08/14/2020   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) 08/14/2020   Nuclear sclerotic cataract of both eyes 08/14/2020   Vitreomacular adhesion of both eyes 08/14/2020   Abdominal pain    E coli bacteremia    Pyelonephritis 04/07/2020   Sinusitis 07/04/2012   Elevated BP 07/04/2012   Rhinitis medicamentosa 07/04/2012   FIBROIDS, UTERUS 02/24/2009   UNSPECIFIED ABNORMAL MAMMOGRAM 02/24/2009   DYSFUNCTIONAL UTERINE BLEEDING 08/02/2008   ANEMIA 06/04/2008   SCIATICA 06/04/2008   TOBACCO DEPENDENCE 11/10/2006    PCP: Lawrance Presume, MD   REFERRING PROVIDER: Shauna Del, DO   REFERRING DIAG:  M25.511,G89.29,M25.512 (ICD-10-CM) - Chronic pain of both shoulders  M12.811,M12.812 (ICD-10-CM) - Rotator cuff arthropathy of both shoulders  M75.42 (ICD-10-CM) - Impingement syndrome of left shoulder  M19.011,M19.012 (ICD-10-CM) - Localized osteoarthritis of  shoulders, bilateral    THERAPY DIAG:  Chronic pain of both shoulders  Muscle weakness (generalized)  Stiffness of right shoulder, not elsewhere classified  Rationale for Evaluation and Treatment: Rehabilitation  ONSET DATE: 3 year ago  SUBJECTIVE:                                                                                                                                                                                      SUBJECTIVE STATEMENT: Pt reports approx 3 years she fell forward going down steps. She grabbed the rail with her L hand, while she used  her R hand to break the fall. She broke her R hand during the fall. She notes sometime afterward both shoulders started hurting. Lifting her R arm is very limited and she uses the L hand to assist.   Hand dominance: Right  PERTINENT HISTORY: DM  PAIN:  Are you having pain? Yes: NPRS scale: R shoulder 3-10/10, L shoulder 3-10/10 Pain location: Both shoulders Pain description: constant, throb, burning Aggravating factors: raising her arm  Relieving factors: Medications, heating pad  PRECAUTIONS: None  RED FLAGS: None   WEIGHT BEARING RESTRICTIONS: No  FALLS:  Has patient fallen in last 6 months? No  LIVING ENVIRONMENT: Lives with: lives with their family Lives in: House/apartment Able to access home  OCCUPATION: Not working  PLOF: Independent with basic ADLs  PATIENT GOALS:To be able to lift her R arm above shoulder height. Better use of both arms with less pain.  NEXT MD VISIT:   OBJECTIVE:  Note: Objective measures were completed at Evaluation unless otherwise noted.  DIAGNOSTIC FINDINGS:  DG shoulders 12/01/23  Right IMPRESSION: 1. Moderate acromioclavicular and mild glenohumeral osteoarthritis. 2. Moderate spur extending anteriorly, inferiorly, and laterally from the lateral acromion.   Left FINDINGS:  Normal bone mineralization. Normal glenohumeral and  acromioclavicular alignment. Mild  acromioclavicular joint space .   PATIENT SURVEYS:  Quick Dash 45/55=80%  COGNITION: Overall cognitive status: Within functional limits for tasks assessed     SENSATION: WFL  POSTURE: Forward head, rounded shoulders  UPPER EXTREMITY ROM:  Popping of the R shoulder with all movements Active ROM Right eval Left eval  Shoulder flexion  A 60 p P 120 130  Shoulder extension    Shoulder abduction 50 p 100 p  Shoulder adduction    Shoulder internal rotation Mid glutes p L5  Shoulder external rotation Upper trap p T3  Elbow flexion    Elbow extension    Wrist flexion    Wrist extension    Wrist ulnar deviation    Wrist radial deviation    Wrist pronation    Wrist supination    P=pain (Blank rows = not tested)  UPPER EXTREMITY MMT:  Scapular weakness MMT Right eval Left eval  Shoulder flexion 4 4+  Shoulder extension    Shoulder abduction 4 4+  Shoulder adduction    Shoulder internal rotation 4 4+  Shoulder external rotation 4 p 4+  Middle trapezius    Lower trapezius    Elbow flexion    Elbow extension    Wrist flexion    Wrist extension    Wrist ulnar deviation    Wrist radial deviation    Wrist pronation    Wrist supination    Grip strength (lbs)    (Blank rows = not tested)  SHOULDER SPECIAL TESTS: Impingement tests: Hawkins/Kennedy impingement test: negative Rotator cuff assessment: Empty can test: negative and Full can test: negative Min pain c RC testing, no overt weakness  PALPATION:  TTP bilat upper traps and peri-GH joints  TREATMENT DATE:  [facility] Adult PT Treatment:                                                DATE: 01/25/24 Therapeutic Exercise: Developed, instructed in, and pt completed therex as noted in HEP   PATIENT EDUCATION: Education details: Eval findings, POC, HEP Person educated: Patient Education method:  Explanation, Demonstration, Tactile cues, Verbal cues, and Handouts Education comprehension: verbalized understanding, returned demonstration, verbal cues required, and tactile cues required  HOME EXERCISE PROGRAM: Access Code: [email or web address] URL: [email or web address] Date: 01/25/2024 Prepared by: Liborio Reeds  Exercises - Supine Shoulder Press with Dowel  - 1 x daily - 7 x weekly - 2 sets - 10 reps - 3 hold - Supine Shoulder Press AAROM in Abduction with Dowel  - 1 x daily - 7 x weekly - 2 sets - 10 reps - 3 hold - Supine Shoulder External Rotation with Resistance  - 1 x daily - 7 x weekly - 3 sets - 10 reps - 3 hold - Shoulder extension with resistance - Neutral  - 1 x daily - 7 x weekly - 3 sets - 10 reps - 3 hold  ASSESSMENT:  CLINICAL IMPRESSION: Patient is a 58 y.o. 32 who was seen today for physical therapy evaluation and treatment for  M25.511,G89.29,M25.512 (ICD-10-CM) - Chronic pain of both shoulders  M12.811,M12.812 (ICD-10-CM) - Rotator cuff arthropathy of both shoulders  M75.42 (ICD-10-CM) - Impingement syndrome of left shoulder  M19.011,M19.012 (ICD-10-CM) - Localized osteoarthritis of shoulders, bilateral  .   OBJECTIVE IMPAIRMENTS: decreased ROM, decreased strength, impaired UE functional use, postural dysfunction, and pain.   ACTIVITY LIMITATIONS: carrying, lifting, bathing, dressing, reach over head, and caring for others  PARTICIPATION LIMITATIONS: meal prep, cleaning, laundry, and shopping  PERSONAL FACTORS: Fitness, Past/current experiences, Time since onset of injury/illness/exacerbation, and 1 comorbidity: DM are also affecting patient's functional outcome.   REHAB POTENTIAL: Good  CLINICAL DECISION MAKING: Evolving/moderate complexity  EVALUATION COMPLEXITY: Moderate   GOALS:  SHORT TERM GOALS = LTGs  LONG TERM GOALS: Target date: 03/16/24  Pt will be Ind in a final HEP to maintain achieved LOF  Baseline: started Goal status: INITIAL  2.   Increase AROM of the R shoulder for improved function with overhead reaching, dressing, and bathing  Baseline:  Goal status: INITIAL  3.  Increase R shoulder strength to 4+/5 for improved functional use Baseline:  Goal status: INITIAL  4.  Pt will report 50% improvement in bilat shoulder pain for improved function and QOL Baseline: 3-10/10 Goal status: INITIAL  5.  Pt's Quick Dash score will improve by the MCID to 66% as indication of improved function  Baseline: 80% Goal status: INITIAL  PLAN:  PT FREQUENCY: 2x/week  PT DURATION: 6 weeks  PLANNED INTERVENTIONS: 97164- PT Re-evaluation, 97110-Therapeutic exercises, 97530- Therapeutic activity, 97112- Neuromuscular re-education, 97535- Self Care, 54098- Manual therapy, [id number]- Aquatic Therapy, J1914- Electrical stimulation (unattended), [phone]- Ionotophoresis 4mg /ml Dexamethasone , Patient/Family education, Taping, Dry Needling, Joint mobilization, Cryotherapy, and Moist heat  PLAN FOR NEXT SESSION: Assess response to HEP; progress therex as indicated; use of modalities, manual therapy; and TPDN as indicated.  Keaghan Bowens MS, PT 01/26/24 7:29 AM  [employer] Authorization   Choose one: Rehabilitative  Standardized Assessment or Functional Outcome Tool: See Pain Assessment and Quick DASH  Score or Percent Disability: 80%  Body Parts Treated (  Select each separately):  Rt Shoulder. Overall deficits/functional limitations for body part selected: severe Lt Shoulder. Overall deficits/functional limitations for body part selected: moderate   If treatment provided at initial evaluation, no treatment charged due to lack of authorization.

## 2024-01-25 ENCOUNTER — Other Ambulatory Visit: Payer: Self-pay

## 2024-01-25 ENCOUNTER — Ambulatory Visit: Attending: Sports Medicine

## 2024-01-25 DIAGNOSIS — M19011 Primary osteoarthritis, right shoulder: Secondary | ICD-10-CM | POA: Insufficient documentation

## 2024-01-25 DIAGNOSIS — M7542 Impingement syndrome of left shoulder: Secondary | ICD-10-CM | POA: Insufficient documentation

## 2024-01-25 DIAGNOSIS — M25611 Stiffness of right shoulder, not elsewhere classified: Secondary | ICD-10-CM | POA: Diagnosis not present

## 2024-01-25 DIAGNOSIS — M12812 Other specific arthropathies, not elsewhere classified, left shoulder: Secondary | ICD-10-CM | POA: Insufficient documentation

## 2024-01-25 DIAGNOSIS — M19012 Primary osteoarthritis, left shoulder: Secondary | ICD-10-CM | POA: Diagnosis not present

## 2024-01-25 DIAGNOSIS — M6281 Muscle weakness (generalized): Secondary | ICD-10-CM | POA: Diagnosis not present

## 2024-01-25 DIAGNOSIS — M25512 Pain in left shoulder: Secondary | ICD-10-CM | POA: Diagnosis not present

## 2024-01-25 DIAGNOSIS — M12811 Other specific arthropathies, not elsewhere classified, right shoulder: Secondary | ICD-10-CM | POA: Diagnosis not present

## 2024-01-25 DIAGNOSIS — G8929 Other chronic pain: Secondary | ICD-10-CM | POA: Insufficient documentation

## 2024-01-25 DIAGNOSIS — M25511 Pain in right shoulder: Secondary | ICD-10-CM | POA: Insufficient documentation

## 2024-01-27 ENCOUNTER — Other Ambulatory Visit: Payer: Self-pay

## 2024-02-02 ENCOUNTER — Ambulatory Visit (INDEPENDENT_AMBULATORY_CARE_PROVIDER_SITE_OTHER): Admitting: Podiatry

## 2024-02-02 DIAGNOSIS — S86012A Strain of left Achilles tendon, initial encounter: Secondary | ICD-10-CM | POA: Diagnosis not present

## 2024-02-03 ENCOUNTER — Other Ambulatory Visit: Payer: Self-pay | Admitting: Internal Medicine

## 2024-02-06 NOTE — Progress Notes (Signed)
 Presents today for follow-up of the Achilles tendon her left foot.  States the pain is becoming so great she can hardly perform her daily activities she has tried wearing the boot she says it really does not help she is in excruciating pain.  She continues trying her physical therapy her anti-inflammatories and just trying to stay off the foot is much as possible but is not helping.  Objective: Vital signs stable alert oriented x 3 significant swelling of the posterior aspect of the left foot with a incontinuity of the lateral margin of the Achilles.  Feels more like a ruptured tear or tear though the remainder portion of the tendon is intact she does have plantarflexion against resistance which is quite strong.  It is warm to the touch but does not appear to be infected there is no open holes or lesions noted.  Assessment: Probable tear of the Achilles just above the superior margin of the calcaneus cannot rule out tears of the interstitial nature of the Achilles body at its insertion on the posterior calcaneus.  Plan: Discussed etiology pathology conservative surgical therapies recommend she continue to wear the immobilization device.  Also recommended that an MRI be performed of the rear foot and the Achilles.  The MRI will assist in surgical planning and differential diagnoses.  She will continue her anti-inflammatories until MRI is returned and discussed.

## 2024-02-07 NOTE — Telephone Encounter (Signed)
 Too soon for refill.  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 Lawrance Presume, MD   2 months ago Chronic pain of both shoulders   [facility] Of [place]. [facility] Lawrance Presume, MD   11 months ago Subacute vaginitis   [facility] [place]. [facility] Concetta Dee B, MD   1 year ago 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, macular edema presence unspecified, unspecified retinopathy severity, unspecified whether long term insulin  use (HCC)   [facility] [place]. [facility] Concetta Dee B, MD   1 year ago 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, macular edema presence unspecified, unspecified retinopathy severity, unspecified whether long term insulin  use (HCC)   [facility] Of [place]. [facility] Lawrance Presume, MD

## 2024-02-09 ENCOUNTER — Telehealth: Payer: Self-pay

## 2024-02-09 ENCOUNTER — Other Ambulatory Visit: Payer: Self-pay | Admitting: Podiatry

## 2024-02-09 ENCOUNTER — Other Ambulatory Visit: Payer: Self-pay

## 2024-02-09 ENCOUNTER — Ambulatory Visit

## 2024-02-09 ENCOUNTER — Other Ambulatory Visit: Payer: Self-pay | Admitting: Internal Medicine

## 2024-02-09 DIAGNOSIS — M25512 Pain in left shoulder: Secondary | ICD-10-CM | POA: Diagnosis not present

## 2024-02-09 DIAGNOSIS — M7542 Impingement syndrome of left shoulder: Secondary | ICD-10-CM | POA: Diagnosis not present

## 2024-02-09 DIAGNOSIS — M19011 Primary osteoarthritis, right shoulder: Secondary | ICD-10-CM | POA: Diagnosis not present

## 2024-02-09 DIAGNOSIS — M25611 Stiffness of right shoulder, not elsewhere classified: Secondary | ICD-10-CM

## 2024-02-09 DIAGNOSIS — M19012 Primary osteoarthritis, left shoulder: Secondary | ICD-10-CM | POA: Diagnosis not present

## 2024-02-09 DIAGNOSIS — G8929 Other chronic pain: Secondary | ICD-10-CM | POA: Diagnosis not present

## 2024-02-09 DIAGNOSIS — M6281 Muscle weakness (generalized): Secondary | ICD-10-CM

## 2024-02-09 DIAGNOSIS — M25511 Pain in right shoulder: Secondary | ICD-10-CM | POA: Diagnosis not present

## 2024-02-09 MED ORDER — CELECOXIB 200 MG PO CAPS
200.0000 mg | ORAL_CAPSULE | Freq: Two times a day (BID) | ORAL | 3 refills | Status: AC
  Filled 2024-02-09: qty 60, 30d supply, fill #0
  Filled 2024-04-02: qty 60, 30d supply, fill #1

## 2024-02-09 NOTE — Telephone Encounter (Signed)
 Patient called and left a message - she is asking for celebrex , wants it sent to [facility].

## 2024-02-09 NOTE — Therapy (Addendum)
 OUTPATIENT PHYSICAL THERAPY SHOULDER   Patient Name: Tamara Barnes MRN: [id number] DOB:18-Sep-1965, 58 y.o., female Today's Date: 02/09/2024  END OF SESSION:  PT End of Session - 02/09/24 1058     Visit Number 2    Number of Visits 13    Date for PT Re-Evaluation 03/16/24    Authorization Type [place] MEDICAID [facility]    Authorization Time Period Approved 10 visits 01/25/24-03/25/24    Authorization - Visit Number 2    Authorization - Number of Visits 10    PT Start Time 1100    PT Stop Time 1142    PT Time Calculation (min) 42 min    Activity Tolerance Patient tolerated treatment well    Behavior During Therapy WFL for tasks assessed/performed              Past Medical History:  Diagnosis Date   Diabetes mellitus without complication (HCC)    Hypertension    Past Surgical History:  Procedure Laterality Date   FRACTURE SURGERY     HAND SURGERY Left    tuabl ligation     TUBAL LIGATION     Patient Active Problem List   Diagnosis Date Noted   Mass of joint of left hand 03/08/2023   Pain of left hand 01/13/2023   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) 08/14/2020   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) 08/14/2020   Nuclear sclerotic cataract of both eyes 08/14/2020   Vitreomacular adhesion of both eyes 08/14/2020   Abdominal pain    E coli bacteremia    Pyelonephritis 04/07/2020   Sinusitis 07/04/2012   Elevated BP 07/04/2012   Rhinitis medicamentosa 07/04/2012   FIBROIDS, UTERUS 02/24/2009   UNSPECIFIED ABNORMAL MAMMOGRAM 02/24/2009   DYSFUNCTIONAL UTERINE BLEEDING 08/02/2008   ANEMIA 06/04/2008   SCIATICA 06/04/2008   TOBACCO DEPENDENCE 11/10/2006    PCP: Lawrance Presume, MD   REFERRING PROVIDER: Shauna Del, DO   REFERRING DIAG:  M25.511,G89.29,M25.512 (ICD-10-CM) - Chronic pain of both shoulders  M12.811,M12.812 (ICD-10-CM) - Rotator cuff arthropathy of both shoulders  M75.42 (ICD-10-CM) -  Impingement syndrome of left shoulder  M19.011,M19.012 (ICD-10-CM) - Localized osteoarthritis of shoulders, bilateral    THERAPY DIAG:  Chronic pain of both shoulders  Muscle weakness (generalized)  Stiffness of right shoulder, not elsewhere classified  Rationale for Evaluation and Treatment: Rehabilitation  ONSET DATE: 3 year ago  SUBJECTIVE:                                                                                                                                                                                      SUBJECTIVE STATEMENT: Pt reports her  L shoulder is doing much better, but the R has not improved. Pt reports completing her HEP almost daily. Pt does report no to min R shoulder pain at rest.  EVAL: Pt reports approx 3 years she fell forward going down steps. She grabbed the rail with her L hand, while she used her R hand to break the fall. She broke her R hand during the fall. She notes sometime afterward both shoulders started hurting. Lifting her R arm is very limited and she uses the L hand to assist.   Hand dominance: Right  PERTINENT HISTORY: DM  PAIN:  Are you having pain? Yes: NPRS scale: R shoulder 8/10, L shoulder 0/10 Pain location: Both shoulders Pain description: constant, throb, burning Aggravating factors: raising her arm  Relieving factors: Medications, heating pad  PRECAUTIONS: None  RED FLAGS: None   WEIGHT BEARING RESTRICTIONS: No  FALLS:  Has patient fallen in last 6 months? No  LIVING ENVIRONMENT: Lives with: lives with their family Lives in: House/apartment Able to access home  OCCUPATION: Not working  PLOF: Independent with basic ADLs  PATIENT GOALS:To be able to lift her R arm above shoulder height. Better use of both arms with less pain.  NEXT MD VISIT:   OBJECTIVE:  Note: Objective measures were completed at Evaluation unless otherwise noted.  DIAGNOSTIC FINDINGS:  DG shoulders 12/01/23  Right IMPRESSION: 1.  Moderate acromioclavicular and mild glenohumeral osteoarthritis. 2. Moderate spur extending anteriorly, inferiorly, and laterally from the lateral acromion.   Left FINDINGS:  Normal bone mineralization. Normal glenohumeral and  acromioclavicular alignment. Mild acromioclavicular joint space .   PATIENT SURVEYS:  Quick Dash 45/55=80%  COGNITION: Overall cognitive status: Within functional limits for tasks assessed     SENSATION: WFL  POSTURE: Forward head, rounded shoulders  UPPER EXTREMITY ROM:  Popping of the R shoulder with all movements Active ROM Right eval Left eval  Shoulder flexion  A 60 p P 120 130  Shoulder extension    Shoulder abduction 50 p 100 p  Shoulder adduction    Shoulder internal rotation Mid glutes p L5  Shoulder external rotation Upper trap p T3  Elbow flexion    Elbow extension    Wrist flexion    Wrist extension    Wrist ulnar deviation    Wrist radial deviation    Wrist pronation    Wrist supination    P=pain (Blank rows = not tested)  UPPER EXTREMITY MMT:  Scapular weakness MMT Right eval Left eval  Shoulder flexion 4 4+  Shoulder extension    Shoulder abduction 4 4+  Shoulder adduction    Shoulder internal rotation 4 4+  Shoulder external rotation 4 p 4+  Middle trapezius    Lower trapezius    Elbow flexion    Elbow extension    Wrist flexion    Wrist extension    Wrist ulnar deviation    Wrist radial deviation    Wrist pronation    Wrist supination    Grip strength (lbs)    (Blank rows = not tested)  SHOULDER SPECIAL TESTS: Impingement tests: Hawkins/Kennedy impingement test: negative Rotator cuff assessment: Empty can test: negative and Full can test: negative Min pain c RC testing, no overt weakness  PALPATION:  TTP bilat upper traps and peri-GH joints  TREATMENT DATE:  [facility] Adult PT  Treatment:                                                DATE: 02/09/24 Therapeutic Exercise: R shoulder flexion supine, supine and passive, and AA c Pball. All movements for shoulder flexion created popping and pain Standing shoulder External Rotation YTB 2x12 Shoulder extension RTB 2x12 Shoulder row RTB 2x12 Standing Bicep Curls YTB 2x12 HEP updated Self Care: Minimize above shoulder use of the R UE  [facility] Adult PT Treatment:                                                DATE: 01/25/24 Therapeutic Exercise: Developed, instructed in, and pt completed therex as noted in HEP   PATIENT EDUCATION: Education details: Eval findings, POC, HEP Person educated: Patient Education method: Explanation, Demonstration, Tactile cues, Verbal cues, and Handouts Education comprehension: verbalized understanding, returned demonstration, verbal cues required, and tactile cues required  HOME EXERCISE PROGRAM: Access Code: [email or web address] URL: [email or web address] Date: 02/09/2024 Prepared by: Liborio Reeds  Exercises - Supine Shoulder External Rotation with Resistance  - 1 x daily - 7 x weekly - 3 sets - 10 reps - 3 hold - Shoulder extension with resistance - Neutral  - 1 x daily - 7 x weekly - 3 sets - 10 reps - 3 hold - Standing Shoulder Row with Anchored Resistance  - 1 x daily - 7 x weekly - 3 sets - 10 reps - 3 hold - Standing Bicep Curls with Resistance  - 1 x daily - 7 x weekly - 3 sets - 10 reps - 3 hold  ASSESSMENT:  CLINICAL IMPRESSION: L shoulder pain has improved. R shoulder pain is the same and pt experiences popping and pain c shoulder flexion whether active or passive. The R shoulder is TTP of the R anterior shoulder. The R shoulder pain abates when at rest. R ER is observably weaker than L. With popping and pain of the R shoulder articular issues are a concern as well a RC issues with observed R shoulder weakness. Therex were completed for RC and periscapular strengthening. Pt's  HEP was updated. Pt will continue to benefit from skilled PT to address impairments for improved function.   OBJECTIVE IMPAIRMENTS: decreased ROM, decreased strength, impaired UE functional use, postural dysfunction, and pain.   ACTIVITY LIMITATIONS: carrying, lifting, bathing, dressing, reach over head, and caring for others  PARTICIPATION LIMITATIONS: meal prep, cleaning, laundry, and shopping  PERSONAL FACTORS: Fitness, Past/current experiences, Time since onset of injury/illness/exacerbation, and 1 comorbidity: DM are also affecting patient's functional outcome.   REHAB POTENTIAL: Good  CLINICAL DECISION MAKING: Evolving/moderate complexity  EVALUATION COMPLEXITY: Moderate   GOALS:  SHORT TERM GOALS = LTGs  LONG TERM GOALS: Target date: 03/16/24  Pt will be Ind in a final HEP to maintain achieved LOF  Baseline: started Goal status: INITIAL  2.  Increase AROM of the R shoulder for improved function with overhead reaching, dressing, and bathing  Baseline:  Goal status: INITIAL  3.  Increase R shoulder strength to 4+/5 for improved functional use Baseline:  Goal status: INITIAL  4.  Pt will report 50% improvement in bilat shoulder pain for improved  function and QOL Baseline: 3-10/10 Goal status: INITIAL  5.  Pt's Quick Dash score will improve by the MCID to 66% as indication of improved function  Baseline: 80% Goal status: INITIAL  PLAN:  PT FREQUENCY: 2x/week  PT DURATION: 6 weeks  PLANNED INTERVENTIONS: 97164- PT Re-evaluation, 97110-Therapeutic exercises, 97530- Therapeutic activity, 97112- Neuromuscular re-education, 97535- Self Care, 16109- Manual therapy, [id number]- Aquatic Therapy, U0454- Electrical stimulation (unattended), [phone]- Ionotophoresis 4mg /ml Dexamethasone , Patient/Family education, Taping, Dry Needling, Joint mobilization, Cryotherapy, and Moist heat  PLAN FOR NEXT SESSION: Assess response to HEP; progress therex as indicated; use of modalities, manual  therapy; and TPDN as indicated.  Eldra Word MS, PT 02/09/24 5:52 PM  Liborio Reeds MS, PT 02/14/24 12:53 PM

## 2024-02-12 NOTE — Therapy (Signed)
 OUTPATIENT PHYSICAL THERAPY SHOULDER   Patient Name: Tamara Barnes MRN: [id number] DOB:09-Mar-1966, 58 y.o., female Today's Date: 02/14/2024  END OF SESSION:  PT End of Session - 02/14/24 1113     Visit Number 3    Number of Visits 13    Date for PT Re-Evaluation 03/16/24    Authorization Type [place] MEDICAID [facility]    Authorization Time Period Approved 10 visits 01/25/24-03/25/24    Authorization - Visit Number 3    Authorization - Number of Visits 10    PT Start Time 1106    PT Stop Time 1148    PT Time Calculation (min) 42 min    Activity Tolerance Patient tolerated treatment well    Behavior During Therapy WFL for tasks assessed/performed               Past Medical History:  Diagnosis Date   Diabetes mellitus without complication (HCC)    Hypertension    Past Surgical History:  Procedure Laterality Date   FRACTURE SURGERY     HAND SURGERY Left    tuabl ligation     TUBAL LIGATION     Patient Active Problem List   Diagnosis Date Noted   Mass of joint of left hand 03/08/2023   Pain of left hand 01/13/2023   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) 08/14/2020   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) 08/14/2020   Nuclear sclerotic cataract of both eyes 08/14/2020   Vitreomacular adhesion of both eyes 08/14/2020   Abdominal pain    E coli bacteremia    Pyelonephritis 04/07/2020   Sinusitis 07/04/2012   Elevated BP 07/04/2012   Rhinitis medicamentosa 07/04/2012   FIBROIDS, UTERUS 02/24/2009   UNSPECIFIED ABNORMAL MAMMOGRAM 02/24/2009   DYSFUNCTIONAL UTERINE BLEEDING 08/02/2008   ANEMIA 06/04/2008   SCIATICA 06/04/2008   TOBACCO DEPENDENCE 11/10/2006    PCP: Lawrance Presume, MD   REFERRING PROVIDER: Shauna Del, DO   REFERRING DIAG:  M25.511,G89.29,M25.512 (ICD-10-CM) - Chronic pain of both shoulders  M12.811,M12.812 (ICD-10-CM) - Rotator cuff arthropathy of both shoulders  M75.42 (ICD-10-CM) -  Impingement syndrome of left shoulder  M19.011,M19.012 (ICD-10-CM) - Localized osteoarthritis of shoulders, bilateral    THERAPY DIAG:  Chronic pain of both shoulders  Muscle weakness (generalized)  Stiffness of right shoulder, not elsewhere classified  Rationale for Evaluation and Treatment: Rehabilitation  ONSET DATE: 3 year ago  SUBJECTIVE:                                                                                                                                                                                      SUBJECTIVE STATEMENT: Pt reports  her L shoulder is doing well, 0 to little pain. Lifting the R arm continues to hurt her R shoulder   EVAL: Pt reports approx 3 years she fell forward going down steps. She grabbed the rail with her L hand, while she used her R hand to break the fall. She broke her R hand during the fall. She notes sometime afterward both shoulders started hurting. Lifting her R arm is very limited and she uses the L hand to assist.   Hand dominance: Right  PERTINENT HISTORY: DM  PAIN:  Are you having pain? Yes: NPRS scale: R shoulder 5/10, L shoulder 0/10 Pain location: Both shoulders Pain description: constant, throb, burning Aggravating factors: raising her arm  Relieving factors: Medications, heating pad  PRECAUTIONS: None  RED FLAGS: None   WEIGHT BEARING RESTRICTIONS: No  FALLS:  Has patient fallen in last 6 months? No  LIVING ENVIRONMENT: Lives with: lives with their family Lives in: House/apartment Able to access home  OCCUPATION: Not working  PLOF: Independent with basic ADLs  PATIENT GOALS:To be able to lift her R arm above shoulder height. Better use of both arms with less pain.  NEXT MD VISIT:   OBJECTIVE:  Note: Objective measures were completed at Evaluation unless otherwise noted.  DIAGNOSTIC FINDINGS:  DG shoulders 12/01/23  Right IMPRESSION: 1. Moderate acromioclavicular and mild glenohumeral  osteoarthritis. 2. Moderate spur extending anteriorly, inferiorly, and laterally from the lateral acromion.   Left FINDINGS:  Normal bone mineralization. Normal glenohumeral and  acromioclavicular alignment. Mild acromioclavicular joint space .   PATIENT SURVEYS:  Quick Dash 45/55=80%  COGNITION: Overall cognitive status: Within functional limits for tasks assessed     SENSATION: WFL  POSTURE: Forward head, rounded shoulders  UPPER EXTREMITY ROM:  Popping of the R shoulder with all movements Active ROM Right eval Left eval  Shoulder flexion  A 60 p P 120 130  Shoulder extension    Shoulder abduction 50 p 100 p  Shoulder adduction    Shoulder internal rotation Mid glutes p L5  Shoulder external rotation Upper trap p T3  Elbow flexion    Elbow extension    Wrist flexion    Wrist extension    Wrist ulnar deviation    Wrist radial deviation    Wrist pronation    Wrist supination    P=pain (Blank rows = not tested)  UPPER EXTREMITY MMT:  Scapular weakness MMT Right eval Left eval  Shoulder flexion 4 4+  Shoulder extension    Shoulder abduction 4 4+  Shoulder adduction    Shoulder internal rotation 4 4+  Shoulder external rotation 4 p 4+  Middle trapezius    Lower trapezius    Elbow flexion    Elbow extension    Wrist flexion    Wrist extension    Wrist ulnar deviation    Wrist radial deviation    Wrist pronation    Wrist supination    Grip strength (lbs)    (Blank rows = not tested)  SHOULDER SPECIAL TESTS: Impingement tests: Hawkins/Kennedy impingement test: negative Rotator cuff assessment: Empty can test: negative and Full can test: negative Min pain c RC testing, no overt weakness  PALPATION:  TTP bilat upper traps and peri-GH joints  TREATMENT DATE:  [facility] Adult PT Treatment:                                                 DATE: 02/14/24 Therapeutic Activities: Pulley shoulder flexion Standing bilat shoulder External Rotation YTB 2x12 Shoulder extension GTB 2x15 Shoulder row GTB 2x15 Shoulder IR GTB 2x12, each Standing bilat Bicep Curls YTB 2x12 Therapeutic Exercises: S/L R shoulder ER 2# 2x12, R 1# 2x10 S/L R shoulder abd to 90d palm down 3x8 S/L R shoulder flex to 90d 3x5  [facility] Adult PT Treatment:                                                DATE: 02/09/24 Therapeutic Exercise: R shoulder flexion supine, supine and passive, and AA c Pball. All movements for shoulder flexion created popping and pain Standing shoulder External Rotation YTB 2x12 Shoulder extension RTB 2x12 Shoulder row RTB 2x12 Standing Bicep Curls YTB 2x12 HEP updated Self Care: Minimize above shoulder use of the R UE  [facility] Adult PT Treatment:                                                DATE: 01/25/24 Therapeutic Exercise: Developed, instructed in, and pt completed therex as noted in HEP   PATIENT EDUCATION: Education details: Eval findings, POC, HEP Person educated: Patient Education method: Explanation, Demonstration, Tactile cues, Verbal cues, and Handouts Education comprehension: verbalized understanding, returned demonstration, verbal cues required, and tactile cues required  HOME EXERCISE PROGRAM: Access Code: [email or web address] URL: [email or web address] Date: 02/09/2024 Prepared by: Liborio Reeds  Exercises - Supine Shoulder External Rotation with Resistance  - 1 x daily - 7 x weekly - 3 sets - 10 reps - 3 hold - Shoulder extension with resistance - Neutral  - 1 x daily - 7 x weekly - 3 sets - 10 reps - 3 hold - Standing Shoulder Row with Anchored Resistance  - 1 x daily - 7 x weekly - 3 sets - 10 reps - 3 hold - Standing Bicep Curls with Resistance  - 1 x daily - 7 x weekly - 3 sets - 10 reps - 3 hold  ASSESSMENT:  CLINICAL IMPRESSION: PT was completed for RC and periscapular strengthening for both  shoulders. Pt was able to better tolerate R flexion and abd in a gravity decreased position. Pt continues to report popping and ER weakness is apparent for the R shoulder. Pt tolerated prescribed exercises in PT today without adverse effects.  OBJECTIVE IMPAIRMENTS: decreased ROM, decreased strength, impaired UE functional use, postural dysfunction, and pain.   ACTIVITY LIMITATIONS: carrying, lifting, bathing, dressing, reach over head, and caring for others  PARTICIPATION LIMITATIONS: meal prep, cleaning, laundry, and shopping  PERSONAL FACTORS: Fitness, Past/current experiences, Time since onset of injury/illness/exacerbation, and 1 comorbidity: DM are also affecting patient's functional outcome.   REHAB POTENTIAL: Good  CLINICAL DECISION MAKING: Evolving/moderate complexity  EVALUATION COMPLEXITY: Moderate   GOALS:  SHORT TERM GOALS = LTGs  LONG TERM GOALS: Target date: 03/16/24  Pt will be Ind in a final HEP to maintain achieved  LOF  Baseline: started Goal status: INITIAL  2.  Increase AROM of the R shoulder for improved function with overhead reaching, dressing, and bathing  Baseline:  Goal status: INITIAL  3.  Increase R shoulder strength to 4+/5 for improved functional use Baseline:  Goal status: INITIAL  4.  Pt will report 50% improvement in bilat shoulder pain for improved function and QOL Baseline: 3-10/10 Goal status: INITIAL  5.  Pt's Quick Dash score will improve by the MCID to 66% as indication of improved function  Baseline: 80% Goal status: INITIAL  PLAN:  PT FREQUENCY: 2x/week  PT DURATION: 6 weeks  PLANNED INTERVENTIONS: 97164- PT Re-evaluation, 97110-Therapeutic exercises, 97530- Therapeutic activity, 97112- Neuromuscular re-education, 97535- Self Care, 21308- Manual therapy, [id number]- Aquatic Therapy, M5784- Electrical stimulation (unattended), [phone]- Ionotophoresis 4mg /ml Dexamethasone , Patient/Family education, Taping, Dry Needling, Joint  mobilization, Cryotherapy, and Moist heat  PLAN FOR NEXT SESSION: Assess response to HEP; progress therex as indicated; use of modalities, manual therapy; and TPDN as indicated.  Jasher Barkan MS, PT 02/14/24 12:51 PM

## 2024-02-14 ENCOUNTER — Ambulatory Visit: Attending: Sports Medicine

## 2024-02-14 ENCOUNTER — Other Ambulatory Visit: Payer: Self-pay

## 2024-02-14 DIAGNOSIS — G8929 Other chronic pain: Secondary | ICD-10-CM | POA: Diagnosis not present

## 2024-02-14 DIAGNOSIS — M25511 Pain in right shoulder: Secondary | ICD-10-CM | POA: Insufficient documentation

## 2024-02-14 DIAGNOSIS — M6281 Muscle weakness (generalized): Secondary | ICD-10-CM | POA: Insufficient documentation

## 2024-02-14 DIAGNOSIS — M25512 Pain in left shoulder: Secondary | ICD-10-CM | POA: Insufficient documentation

## 2024-02-14 DIAGNOSIS — M25611 Stiffness of right shoulder, not elsewhere classified: Secondary | ICD-10-CM | POA: Diagnosis not present

## 2024-02-15 ENCOUNTER — Other Ambulatory Visit: Payer: Self-pay

## 2024-02-16 ENCOUNTER — Other Ambulatory Visit: Payer: Self-pay

## 2024-02-16 ENCOUNTER — Ambulatory Visit: Admitting: Physical Therapy

## 2024-02-16 ENCOUNTER — Encounter: Payer: Self-pay | Admitting: Physical Therapy

## 2024-02-16 DIAGNOSIS — M25511 Pain in right shoulder: Secondary | ICD-10-CM | POA: Diagnosis not present

## 2024-02-16 DIAGNOSIS — M25512 Pain in left shoulder: Secondary | ICD-10-CM | POA: Diagnosis not present

## 2024-02-16 DIAGNOSIS — M25611 Stiffness of right shoulder, not elsewhere classified: Secondary | ICD-10-CM | POA: Diagnosis not present

## 2024-02-16 DIAGNOSIS — M6281 Muscle weakness (generalized): Secondary | ICD-10-CM

## 2024-02-16 DIAGNOSIS — G8929 Other chronic pain: Secondary | ICD-10-CM | POA: Diagnosis not present

## 2024-02-16 NOTE — Therapy (Signed)
 OUTPATIENT PHYSICAL THERAPY SHOULDER   Patient Name: Tamara Barnes MRN: [id number] DOB:Jan 01, 1966, 58 y.o., female Today's Date: 02/16/2024  END OF SESSION:  PT End of Session - 02/16/24 1015     Visit Number 4    Number of Visits 13    Date for PT Re-Evaluation 03/16/24    Authorization Type [place] MEDICAID [facility]    Authorization Time Period Approved 10 visits 01/25/24-03/25/24    Authorization - Visit Number 4    Authorization - Number of Visits 10    PT Start Time 1015    PT Stop Time 1108    PT Time Calculation (min) 53 min               Past Medical History:  Diagnosis Date   Diabetes mellitus without complication (HCC)    Hypertension    Past Surgical History:  Procedure Laterality Date   FRACTURE SURGERY     HAND SURGERY Left    tuabl ligation     TUBAL LIGATION     Patient Active Problem List   Diagnosis Date Noted   Mass of joint of left hand 03/08/2023   Pain of left hand 01/13/2023   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) 08/14/2020   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) 08/14/2020   Nuclear sclerotic cataract of both eyes 08/14/2020   Vitreomacular adhesion of both eyes 08/14/2020   Abdominal pain    E coli bacteremia    Pyelonephritis 04/07/2020   Sinusitis 07/04/2012   Elevated BP 07/04/2012   Rhinitis medicamentosa 07/04/2012   FIBROIDS, UTERUS 02/24/2009   UNSPECIFIED ABNORMAL MAMMOGRAM 02/24/2009   DYSFUNCTIONAL UTERINE BLEEDING 08/02/2008   ANEMIA 06/04/2008   SCIATICA 06/04/2008   TOBACCO DEPENDENCE 11/10/2006    PCP: Lawrance Presume, MD   REFERRING PROVIDER: Shauna Del, DO   REFERRING DIAG:  M25.511,G89.29,M25.512 (ICD-10-CM) - Chronic pain of both shoulders  M12.811,M12.812 (ICD-10-CM) - Rotator cuff arthropathy of both shoulders  M75.42 (ICD-10-CM) - Impingement syndrome of left shoulder  M19.011,M19.012 (ICD-10-CM) - Localized osteoarthritis of shoulders,  bilateral    THERAPY DIAG:  Chronic pain of both shoulders  Muscle weakness (generalized)  Stiffness of right shoulder, not elsewhere classified  Rationale for Evaluation and Treatment: Rehabilitation  ONSET DATE: 3 year ago  SUBJECTIVE:                                                                                                                                                                                      SUBJECTIVE STATEMENT: Pt reports her L shoulder is doing a lot better, Still unable to lift Right arm much.   EVAL: Pt  reports approx 3 years she fell forward going down steps. She grabbed the rail with her L hand, while she used her R hand to break the fall. She broke her R hand during the fall. She notes sometime afterward both shoulders started hurting. Lifting her R arm is very limited and she uses the L hand to assist.   Hand dominance: Right  PERTINENT HISTORY: DM  PAIN:  Are you having pain? Yes: NPRS scale: R shoulder 5/10, L shoulder 0/10 Pain location: Both shoulders Pain description: constant, throb, burning Aggravating factors: raising her arm  Relieving factors: Medications, heating pad  PRECAUTIONS: None  RED FLAGS: None   WEIGHT BEARING RESTRICTIONS: No  FALLS:  Has patient fallen in last 6 months? No  LIVING ENVIRONMENT: Lives with: lives with their family Lives in: House/apartment Able to access home  OCCUPATION: Not working  PLOF: Independent with basic ADLs  PATIENT GOALS:To be able to lift her R arm above shoulder height. Better use of both arms with less pain.  NEXT MD VISIT:   OBJECTIVE:  Note: Objective measures were completed at Evaluation unless otherwise noted.  DIAGNOSTIC FINDINGS:  DG shoulders 12/01/23  Right IMPRESSION: 1. Moderate acromioclavicular and mild glenohumeral osteoarthritis. 2. Moderate spur extending anteriorly, inferiorly, and laterally from the lateral acromion.   Left FINDINGS:  Normal bone  mineralization. Normal glenohumeral and  acromioclavicular alignment. Mild acromioclavicular joint space .   PATIENT SURVEYS:  Quick Dash 45/55=80%  COGNITION: Overall cognitive status: Within functional limits for tasks assessed     SENSATION: WFL  POSTURE: Forward head, rounded shoulders  UPPER EXTREMITY ROM:  Popping of the R shoulder with all movements Active ROM Right eval Left eval Right  02/16/24  Shoulder flexion  A 60 p P 120 130 A 80  Shoulder extension     Shoulder abduction 50 p 100 p   Shoulder adduction     Shoulder internal rotation Mid glutes p L5   Shoulder external rotation Upper trap p T3   Elbow flexion     Elbow extension     Wrist flexion     Wrist extension     Wrist ulnar deviation     Wrist radial deviation     Wrist pronation     Wrist supination     P=pain (Blank rows = not tested)  UPPER EXTREMITY MMT:  Scapular weakness MMT Right eval Left eval  Shoulder flexion 4 4+  Shoulder extension    Shoulder abduction 4 4+  Shoulder adduction    Shoulder internal rotation 4 4+  Shoulder external rotation 4 p 4+  Middle trapezius    Lower trapezius    Elbow flexion    Elbow extension    Wrist flexion    Wrist extension    Wrist ulnar deviation    Wrist radial deviation    Wrist pronation    Wrist supination    Grip strength (lbs)    (Blank rows = not tested)  SHOULDER SPECIAL TESTS: Impingement tests: Hawkins/Kennedy impingement test: negative Rotator cuff assessment: Empty can test: negative and Full can test: negative Min pain c RC testing, no overt weakness  PALPATION:  TTP bilat upper traps and peri-GH joints  TREATMENT DATE:  [facility] Adult PT Treatment:                                                DATE: 02/16/24  Therapeutic Activity: Supine clasped hand chest press AA Supine clasped hand shoulder  flexion AA- increased pain Supine AA ER/IR with dowel  Supine isometrics Shoulder ext, abdct, ER , IR  Seated shoulder flexion table slides- using FR for AA Seated shoulder scaption table slides using FR for AA  Standing row GTB x 15 Standing ext GTB x 15 Standing bilat Bicep Curls YTB 2x12 Pulleys shoulder flexion  Standing shoulder isometric flexion +HEP Modalities: HMP right shoulder    [facility] Adult PT Treatment:                                                DATE: 02/14/24 Therapeutic Activities: Pulley shoulder flexion Standing bilat shoulder External Rotation YTB 2x12 Shoulder extension GTB 2x15 Shoulder row GTB 2x15 Shoulder IR GTB 2x12, each Standing bilat Bicep Curls YTB 2x12 Therapeutic Exercises: S/L R shoulder ER 2# 2x12, R 1# 2x10 S/L R shoulder abd to 90d palm down 3x8 S/L R shoulder flex to 90d 3x5  [facility] Adult PT Treatment:                                                DATE: 02/09/24 Therapeutic Exercise: R shoulder flexion supine, supine and passive, and AA c Pball. All movements for shoulder flexion created popping and pain Standing shoulder External Rotation YTB 2x12 Shoulder extension RTB 2x12 Shoulder row RTB 2x12 Standing Bicep Curls YTB 2x12 HEP updated Self Care: Minimize above shoulder use of the R UE  [facility] Adult PT Treatment:                                                DATE: 01/25/24 Therapeutic Exercise: Developed, instructed in, and pt completed therex as noted in HEP   PATIENT EDUCATION: Education details: Eval findings, POC, HEP Person educated: Patient Education method: Explanation, Demonstration, Tactile cues, Verbal cues, and Handouts Education comprehension: verbalized understanding, returned demonstration, verbal cues required, and tactile cues required  HOME EXERCISE PROGRAM: Access Code: [email or web address] URL: [email or web address] Date: 02/09/2024 Prepared by: Liborio Reeds  Exercises - Supine Shoulder External Rotation  with Resistance  - 1 x daily - 7 x weekly - 3 sets - 10 reps - 3 hold - Shoulder extension with resistance - Neutral  - 1 x daily - 7 x weekly - 3 sets - 10 reps - 3 hold - Standing Shoulder Row with Anchored Resistance  - 1 x daily - 7 x weekly - 3 sets - 10 reps - 3 hold - Standing Bicep Curls with Resistance  - 1 x daily - 7 x weekly - 3 sets - 10 reps - 3 hold - Isometric Shoulder Flexion at Wall  - 1 x daily - 7 x weekly - 1-2 sets - 10 reps -  5 hold  ASSESSMENT:  CLINICAL IMPRESSION: PT was completed for RC and periscapular strengthening for both shoulders. Pt was able to better tolerate R flexion via AAROM and isometrics. Updated HEP. Pt tolerated prescribed exercises in PT today without adverse effects.  OBJECTIVE IMPAIRMENTS: decreased ROM, decreased strength, impaired UE functional use, postural dysfunction, and pain.   ACTIVITY LIMITATIONS: carrying, lifting, bathing, dressing, reach over head, and caring for others  PARTICIPATION LIMITATIONS: meal prep, cleaning, laundry, and shopping  PERSONAL FACTORS: Fitness, Past/current experiences, Time since onset of injury/illness/exacerbation, and 1 comorbidity: DM are also affecting patient's functional outcome.   REHAB POTENTIAL: Good  CLINICAL DECISION MAKING: Evolving/moderate complexity  EVALUATION COMPLEXITY: Moderate   GOALS:  SHORT TERM GOALS = LTGs  LONG TERM GOALS: Target date: 03/16/24  Pt will be Ind in a final HEP to maintain achieved LOF  Baseline: started Goal status: INITIAL  2.  Increase AROM of the R shoulder for improved function with overhead reaching, dressing, and bathing  Baseline:  Goal status: INITIAL  3.  Increase R shoulder strength to 4+/5 for improved functional use Baseline:  Goal status: INITIAL  4.  Pt will report 50% improvement in bilat shoulder pain for improved function and QOL Baseline: 3-10/10 Goal status: INITIAL  5.  Pt's Quick Dash score will improve by the MCID to 66% as  indication of improved function  Baseline: 80% Goal status: INITIAL  PLAN:  PT FREQUENCY: 2x/week  PT DURATION: 6 weeks  PLANNED INTERVENTIONS: 97164- PT Re-evaluation, 97110-Therapeutic exercises, 97530- Therapeutic activity, 97112- Neuromuscular re-education, 97535- Self Care, 40981- Manual therapy, [id number]- Aquatic Therapy, [id number]- Electrical stimulation (unattended), [phone]- Ionotophoresis 4mg /ml Dexamethasone , Patient/Family education, Taping, Dry Needling, Joint mobilization, Cryotherapy, and Moist heat  PLAN FOR NEXT SESSION: Assess response to HEP; progress therex as indicated; use of modalities, manual therapy; and TPDN as indicated. Gasper Karst, PTA 02/16/24 11:09 AM Phone: [phone] Fax: [phone]

## 2024-02-21 ENCOUNTER — Telehealth: Payer: Self-pay

## 2024-02-21 ENCOUNTER — Ambulatory Visit

## 2024-02-21 NOTE — Telephone Encounter (Signed)
 LVM re: 1st no show appt for 02/21/24. Advised pt of the attendance policy and that she did not have any scheduled appts in the future. Recommended pt calling the office to schedule another apt if she wishes to continue with PT.

## 2024-02-21 NOTE — Therapy (Incomplete)
 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) 08/14/2020   Nuclear sclerotic cataract of both eyes 08/14/2020   Vitreomacular adhesion of both eyes 08/14/2020   Abdominal pain    E coli bacteremia    Pyelonephritis 04/07/2020   Sinusitis 07/04/2012   Elevated BP 07/04/2012   Rhinitis medicamentosa 07/04/2012   FIBROIDS, UTERUS 02/24/2009   UNSPECIFIED ABNORMAL MAMMOGRAM 02/24/2009   DYSFUNCTIONAL UTERINE BLEEDING 08/02/2008   ANEMIA 06/04/2008   SCIATICA 06/04/2008   TOBACCO DEPENDENCE 11/10/2006    PCP: Lawrance Presume, MD   REFERRING PROVIDER: Shauna Del, DO   REFERRING DIAG:  M25.511,G89.29,M25.512 (ICD-10-CM) - Chronic pain of both shoulders  M12.811,M12.812 (ICD-10-CM) - Rotator cuff arthropathy of both shoulders  M75.42 (ICD-10-CM) - Impingement syndrome of left shoulder  M19.011,M19.012 (ICD-10-CM) - Localized osteoarthritis of shoulders, bilateral    THERAPY DIAG:  No diagnosis found.  Rationale for Evaluation and Treatment: Rehabilitation  ONSET DATE: 3 year ago  SUBJECTIVE:                                                                                                                                                                                      SUBJECTIVE STATEMENT: Pt reports her L shoulder is doing a lot better,  Still unable to lift Right arm much.   EVAL: Pt reports approx 3 years she fell forward going down steps. She grabbed the rail with her L hand, while she used her R hand to break the fall. She broke her R hand during the fall. She notes sometime afterward both shoulders started hurting. Lifting her R arm is very limited and she uses the L hand to assist.   Hand dominance: Right  PERTINENT HISTORY: DM  PAIN:  Are you having pain? Yes: NPRS scale: R shoulder 5/10, L shoulder 0/10 Pain location: Both shoulders Pain description: constant, throb, burning Aggravating factors: raising her arm  Relieving factors: Medications, heating pad  PRECAUTIONS: None  RED FLAGS: None  WEIGHT BEARING RESTRICTIONS: No  FALLS:  Has patient fallen in last 6 months? No  LIVING ENVIRONMENT: Lives with: lives with their family Lives in: House/apartment Able to access home  OCCUPATION: Not working  PLOF: Independent with basic ADLs  PATIENT GOALS:To be able to lift her R arm above shoulder height. Better use of both arms with less pain.  NEXT MD VISIT:   OBJECTIVE:  Note: Objective measures were completed at Evaluation unless otherwise noted.  DIAGNOSTIC FINDINGS:  DG shoulders 12/01/23  Right IMPRESSION: 1. Moderate acromioclavicular and mild glenohumeral osteoarthritis. 2. Moderate spur extending anteriorly, inferiorly, and laterally from the lateral acromion.   Left FINDINGS:  Normal bone mineralization. Normal glenohumeral and  acromioclavicular alignment. Mild acromioclavicular joint space .   PATIENT SURVEYS:  Quick Dash 45/55=80%  COGNITION: Overall cognitive status: Within functional limits for tasks assessed     SENSATION: WFL  POSTURE: Forward head, rounded shoulders  UPPER EXTREMITY ROM:  Popping of the R shoulder with all movements Active ROM Right eval Left eval Right  02/16/24  Shoulder flexion  A 60 p P 120 130 A 80  Shoulder extension     Shoulder  abduction 50 p 100 p   Shoulder adduction     Shoulder internal rotation Mid glutes p L5   Shoulder external rotation Upper trap p T3   Elbow flexion     Elbow extension     Wrist flexion     Wrist extension     Wrist ulnar deviation     Wrist radial deviation     Wrist pronation     Wrist supination     P=pain (Blank rows = not tested)  UPPER EXTREMITY MMT:  Scapular weakness MMT Right eval Left eval  Shoulder flexion 4 4+  Shoulder extension    Shoulder abduction 4 4+  Shoulder adduction    Shoulder internal rotation 4 4+  Shoulder external rotation 4 p 4+  Middle trapezius    Lower trapezius    Elbow flexion    Elbow extension    Wrist flexion    Wrist extension    Wrist ulnar deviation    Wrist radial deviation    Wrist pronation    Wrist supination    Grip strength (lbs)    (Blank rows = not tested)  SHOULDER SPECIAL TESTS: Impingement tests: Hawkins/Kennedy impingement test: negative Rotator cuff assessment: Empty can test: negative and Full can test: negative Min pain c RC testing, no overt weakness  PALPATION:  TTP bilat upper traps and peri-GH joints                                                                                                                             TREATMENT DATE:  [facility] Adult PT Treatment:  DATE: 02/21/24 Therapeutic Activity: Supine clasped hand chest press AA Supine clasped hand shoulder flexion AA- increased pain Supine AA ER/IR with dowel  Supine isometrics Shoulder ext, abdct, ER , IR  Seated shoulder flexion table slides- using FR for AA Seated shoulder scaption table slides using FR for AA  Standing row GTB x 15 Standing ext GTB x 15 Standing bilat Bicep Curls YTB 2x12 Pulleys shoulder flexion  Standing shoulder isometric flexion +HEP Modalities: HMP right shoulder Therapeutic Exercise: *** Manual Therapy: *** Neuromuscular re-ed: *** Therapeutic  Activity: *** Modalities: *** Self Care: ***  [facility] Adult PT Treatment:                                                DATE: 02/16/24  Therapeutic Activity: Supine clasped hand chest press AA Supine clasped hand shoulder flexion AA- increased pain Supine AA ER/IR with dowel  Supine isometrics Shoulder ext, abdct, ER , IR  Seated shoulder flexion table slides- using FR for AA Seated shoulder scaption table slides using FR for AA  Standing row GTB x 15 Standing ext GTB x 15 Standing bilat Bicep Curls YTB 2x12 Pulleys shoulder flexion  Standing shoulder isometric flexion +HEP Modalities: HMP right shoulder    [facility] Adult PT Treatment:                                                DATE: 02/14/24 Therapeutic Activities: Pulley shoulder flexion Standing bilat shoulder External Rotation YTB 2x12 Shoulder extension GTB 2x15 Shoulder row GTB 2x15 Shoulder IR GTB 2x12, each Standing bilat Bicep Curls YTB 2x12 Therapeutic Exercises: S/L R shoulder ER 2# 2x12, R 1# 2x10 S/L R shoulder abd to 90d palm down 3x8 S/L R shoulder flex to 90d 3x5  PATIENT EDUCATION: Education details: Eval findings, POC, HEP Person educated: Patient Education method: Explanation, Demonstration, Tactile cues, Verbal cues, and Handouts Education comprehension: verbalized understanding, returned demonstration, verbal cues required, and tactile cues required  HOME EXERCISE PROGRAM: Access Code: [email or web address] URL: [email or web address] Date: 02/09/2024 Prepared by: Liborio Reeds  Exercises - Supine Shoulder External Rotation with Resistance  - 1 x daily - 7 x weekly - 3 sets - 10 reps - 3 hold - Shoulder extension with resistance - Neutral  - 1 x daily - 7 x weekly - 3 sets - 10 reps - 3 hold - Standing Shoulder Row with Anchored Resistance  - 1 x daily - 7 x weekly - 3 sets - 10 reps - 3 hold - Standing Bicep Curls with Resistance  - 1 x daily - 7 x weekly - 3 sets - 10 reps - 3 hold - Isometric  Shoulder Flexion at Wall  - 1 x daily - 7 x weekly - 1-2 sets - 10 reps - 5 hold  ASSESSMENT:  CLINICAL IMPRESSION: PT was completed for RC and periscapular strengthening for both shoulders. Pt was able to better tolerate R flexion via AAROM and isometrics. Updated HEP. Pt tolerated prescribed exercises in PT today without adverse effects.  OBJECTIVE IMPAIRMENTS: decreased ROM, decreased strength, impaired UE functional use, postural dysfunction, and pain.   ACTIVITY LIMITATIONS: carrying, lifting, bathing, dressing, reach over head, and caring for others  PARTICIPATION LIMITATIONS: meal prep, cleaning, laundry,  and shopping  PERSONAL FACTORS: Fitness, Past/current experiences, Time since onset of injury/illness/exacerbation, and 1 comorbidity: DM are also affecting patient's functional outcome.   REHAB POTENTIAL: Good  CLINICAL DECISION MAKING: Evolving/moderate complexity  EVALUATION COMPLEXITY: Moderate   GOALS:  SHORT TERM GOALS = LTGs  LONG TERM GOALS: Target date: 03/16/24  Pt will be Ind in a final HEP to maintain achieved LOF  Baseline: started Goal status: INITIAL  2.  Increase AROM of the R shoulder for improved function with overhead reaching, dressing, and bathing  Baseline:  Goal status: INITIAL  3.  Increase R shoulder strength to 4+/5 for improved functional use Baseline:  Goal status: INITIAL  4.  Pt will report 50% improvement in bilat shoulder pain for improved function and QOL Baseline: 3-10/10 Goal status: INITIAL  5.  Pt's Quick Dash score will improve by the MCID to 66% as indication of improved function  Baseline: 80% Goal status: INITIAL  PLAN:  PT FREQUENCY: 2x/week  PT DURATION: 6 weeks  PLANNED INTERVENTIONS: 97164- PT Re-evaluation, 97110-Therapeutic exercises, 97530- Therapeutic activity, 97112- Neuromuscular re-education, 97535- Self Care, 16109- Manual therapy, [id number]- Aquatic Therapy, U0454- Electrical stimulation (unattended), [phone]-  Ionotophoresis 4mg /ml Dexamethasone , Patient/Family education, Taping, Dry Needling, Joint mobilization, Cryotherapy, and Moist heat  PLAN FOR NEXT SESSION: Assess response to HEP; progress therex as indicated; use of modalities, manual therapy; and TPDN as indicated.   Velora Horstman MS, PT 02/21/24 6:21 AM

## 2024-02-22 NOTE — Therapy (Incomplete)
 OUTPATIENT PHYSICAL THERAPY SHOULDER   Patient Name: Tamara Barnes MRN: [id number] DOB:Nov 14, 1965, 58 y.o., female Today's Date: 02/22/2024  END OF SESSION:      Past Medical History:  Diagnosis Date   Diabetes mellitus without complication (HCC)    Hypertension    Past Surgical History:  Procedure Laterality Date   FRACTURE SURGERY     HAND SURGERY Left    tuabl ligation     TUBAL LIGATION     Patient Active Problem List   Diagnosis Date Noted   Mass of joint of left hand 03/08/2023   Pain of left hand 01/13/2023   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) 08/14/2020   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) 08/14/2020   Nuclear sclerotic cataract of both eyes 08/14/2020   Vitreomacular adhesion of both eyes 08/14/2020   Abdominal pain    E coli bacteremia    Pyelonephritis 04/07/2020   Sinusitis 07/04/2012   Elevated BP 07/04/2012   Rhinitis medicamentosa 07/04/2012   FIBROIDS, UTERUS 02/24/2009   UNSPECIFIED ABNORMAL MAMMOGRAM 02/24/2009   DYSFUNCTIONAL UTERINE BLEEDING 08/02/2008   ANEMIA 06/04/2008   SCIATICA 06/04/2008   TOBACCO DEPENDENCE 11/10/2006    PCP: Lawrance Presume, MD   REFERRING PROVIDER: Shauna Del, DO   REFERRING DIAG:  M25.511,G89.29,M25.512 (ICD-10-CM) - Chronic pain of both shoulders  M12.811,M12.812 (ICD-10-CM) - Rotator cuff arthropathy of both shoulders  M75.42 (ICD-10-CM) - Impingement syndrome of left shoulder  M19.011,M19.012 (ICD-10-CM) - Localized osteoarthritis of shoulders, bilateral    THERAPY DIAG:  No diagnosis found.  Rationale for Evaluation and Treatment: Rehabilitation  ONSET DATE: 3 year ago  SUBJECTIVE:                                                                                                                                                                                      SUBJECTIVE STATEMENT: Pt reports her L shoulder is doing a lot better,  Still unable to lift Right arm much.   EVAL: Pt reports approx 3 years she fell forward going down steps. She grabbed the rail with her L hand, while she used her R hand to break the fall. She broke her R hand during the fall. She notes sometime afterward both shoulders started hurting. Lifting her R arm is very limited and she uses the L hand to assist.   Hand dominance: Right  PERTINENT HISTORY: DM  PAIN:  Are you having pain? Yes: NPRS scale: R shoulder 5/10, L shoulder 0/10 Pain location: Both shoulders Pain description: constant, throb, burning Aggravating factors: raising her arm  Relieving factors: Medications, heating pad  PRECAUTIONS: None  RED FLAGS: None  WEIGHT BEARING RESTRICTIONS: No  FALLS:  Has patient fallen in last 6 months? No  LIVING ENVIRONMENT: Lives with: lives with their family Lives in: House/apartment Able to access home  OCCUPATION: Not working  PLOF: Independent with basic ADLs  PATIENT GOALS:To be able to lift her R arm above shoulder height. Better use of both arms with less pain.  NEXT MD VISIT:   OBJECTIVE:  Note: Objective measures were completed at Evaluation unless otherwise noted.  DIAGNOSTIC FINDINGS:  DG shoulders 12/01/23  Right IMPRESSION: 1. Moderate acromioclavicular and mild glenohumeral osteoarthritis. 2. Moderate spur extending anteriorly, inferiorly, and laterally from the lateral acromion.   Left FINDINGS:  Normal bone mineralization. Normal glenohumeral and  acromioclavicular alignment. Mild acromioclavicular joint space .   PATIENT SURVEYS:  Quick Dash 45/55=80%  COGNITION: Overall cognitive status: Within functional limits for tasks assessed     SENSATION: WFL  POSTURE: Forward head, rounded shoulders  UPPER EXTREMITY ROM:  Popping of the R shoulder with all movements Active ROM Right eval Left eval Right  02/16/24  Shoulder flexion  A 60 p P 120 130 A 80  Shoulder extension     Shoulder  abduction 50 p 100 p   Shoulder adduction     Shoulder internal rotation Mid glutes p L5   Shoulder external rotation Upper trap p T3   Elbow flexion     Elbow extension     Wrist flexion     Wrist extension     Wrist ulnar deviation     Wrist radial deviation     Wrist pronation     Wrist supination     P=pain (Blank rows = not tested)  UPPER EXTREMITY MMT:  Scapular weakness MMT Right eval Left eval  Shoulder flexion 4 4+  Shoulder extension    Shoulder abduction 4 4+  Shoulder adduction    Shoulder internal rotation 4 4+  Shoulder external rotation 4 p 4+  Middle trapezius    Lower trapezius    Elbow flexion    Elbow extension    Wrist flexion    Wrist extension    Wrist ulnar deviation    Wrist radial deviation    Wrist pronation    Wrist supination    Grip strength (lbs)    (Blank rows = not tested)  SHOULDER SPECIAL TESTS: Impingement tests: Hawkins/Kennedy impingement test: negative Rotator cuff assessment: Empty can test: negative and Full can test: negative Min pain c RC testing, no overt weakness  PALPATION:  TTP bilat upper traps and peri-GH joints                                                                                                                             TREATMENT DATE:  [facility] Adult PT Treatment:  DATE: 02/23/24 Therapeutic Activity: Supine clasped hand chest press AA Supine clasped hand shoulder flexion AA- increased pain Supine AA ER/IR with dowel  Supine isometrics Shoulder ext, abdct, ER , IR  Seated shoulder flexion table slides- using FR for AA Seated shoulder scaption table slides using FR for AA  Standing row GTB x 15 Standing ext GTB x 15 Standing bilat Bicep Curls YTB 2x12 Pulleys shoulder flexion  Standing shoulder isometric flexion +HEP Modalities: HMP right shoulder Therapeutic Exercise: *** Manual Therapy: *** Neuromuscular re-ed: *** Therapeutic  Activity: *** Modalities: *** Self Care: ***  [facility] Adult PT Treatment:                                                DATE: 02/16/24  Therapeutic Activity: Supine clasped hand chest press AA Supine clasped hand shoulder flexion AA- increased pain Supine AA ER/IR with dowel  Supine isometrics Shoulder ext, abdct, ER , IR  Seated shoulder flexion table slides- using FR for AA Seated shoulder scaption table slides using FR for AA  Standing row GTB x 15 Standing ext GTB x 15 Standing bilat Bicep Curls YTB 2x12 Pulleys shoulder flexion  Standing shoulder isometric flexion +HEP Modalities: HMP right shoulder    [facility] Adult PT Treatment:                                                DATE: 02/14/24 Therapeutic Activities: Pulley shoulder flexion Standing bilat shoulder External Rotation YTB 2x12 Shoulder extension GTB 2x15 Shoulder row GTB 2x15 Shoulder IR GTB 2x12, each Standing bilat Bicep Curls YTB 2x12 Therapeutic Exercises: S/L R shoulder ER 2# 2x12, R 1# 2x10 S/L R shoulder abd to 90d palm down 3x8 S/L R shoulder flex to 90d 3x5  PATIENT EDUCATION: Education details: Eval findings, POC, HEP Person educated: Patient Education method: Explanation, Demonstration, Tactile cues, Verbal cues, and Handouts Education comprehension: verbalized understanding, returned demonstration, verbal cues required, and tactile cues required  HOME EXERCISE PROGRAM: Access Code: [email or web address] URL: [email or web address] Date: 02/09/2024 Prepared by: Liborio Reeds  Exercises - Supine Shoulder External Rotation with Resistance  - 1 x daily - 7 x weekly - 3 sets - 10 reps - 3 hold - Shoulder extension with resistance - Neutral  - 1 x daily - 7 x weekly - 3 sets - 10 reps - 3 hold - Standing Shoulder Row with Anchored Resistance  - 1 x daily - 7 x weekly - 3 sets - 10 reps - 3 hold - Standing Bicep Curls with Resistance  - 1 x daily - 7 x weekly - 3 sets - 10 reps - 3 hold - Isometric  Shoulder Flexion at Wall  - 1 x daily - 7 x weekly - 1-2 sets - 10 reps - 5 hold  ASSESSMENT:  CLINICAL IMPRESSION: PT was completed for RC and periscapular strengthening for both shoulders. Pt was able to better tolerate R flexion via AAROM and isometrics. Updated HEP. Pt tolerated prescribed exercises in PT today without adverse effects.  OBJECTIVE IMPAIRMENTS: decreased ROM, decreased strength, impaired UE functional use, postural dysfunction, and pain.   ACTIVITY LIMITATIONS: carrying, lifting, bathing, dressing, reach over head, and caring for others  PARTICIPATION LIMITATIONS: meal prep, cleaning, laundry,  and shopping  PERSONAL FACTORS: Fitness, Past/current experiences, Time since onset of injury/illness/exacerbation, and 1 comorbidity: DM are also affecting patient's functional outcome.   REHAB POTENTIAL: Good  CLINICAL DECISION MAKING: Evolving/moderate complexity  EVALUATION COMPLEXITY: Moderate   GOALS:  SHORT TERM GOALS = LTGs  LONG TERM GOALS: Target date: 03/16/24  Pt will be Ind in a final HEP to maintain achieved LOF  Baseline: started Goal status: INITIAL  2.  Increase AROM of the R shoulder for improved function with overhead reaching, dressing, and bathing  Baseline:  Goal status: INITIAL  3.  Increase R shoulder strength to 4+/5 for improved functional use Baseline:  Goal status: INITIAL  4.  Pt will report 50% improvement in bilat shoulder pain for improved function and QOL Baseline: 3-10/10 Goal status: INITIAL  5.  Pt's Quick Dash score will improve by the MCID to 66% as indication of improved function  Baseline: 80% Goal status: INITIAL  PLAN:  PT FREQUENCY: 2x/week  PT DURATION: 6 weeks  PLANNED INTERVENTIONS: 97164- PT Re-evaluation, 97110-Therapeutic exercises, 97530- Therapeutic activity, 97112- Neuromuscular re-education, 97535- Self Care, 16109- Manual therapy, [id number]- Aquatic Therapy, U0454- Electrical stimulation (unattended), [phone]-  Ionotophoresis 4mg /ml Dexamethasone , Patient/Family education, Taping, Dry Needling, Joint mobilization, Cryotherapy, and Moist heat  PLAN FOR NEXT SESSION: Assess response to HEP; progress therex as indicated; use of modalities, manual therapy; and TPDN as indicated.   Makalia Bare MS, PT 02/22/24 8:13 AM

## 2024-02-23 ENCOUNTER — Encounter

## 2024-02-23 DIAGNOSIS — Z419 Encounter for procedure for purposes other than remedying health state, unspecified: Secondary | ICD-10-CM | POA: Diagnosis not present

## 2024-02-27 NOTE — Progress Notes (Signed)
 S:     No chief complaint on file.  58 y.o. female who presents for diabetes evaluation, education, and management. Patient arrives in good spirits and presents without any assistance.   Patient was referred and last seen by Primary Care Provider, Dr. Lincoln Renshaw, on 01/17/24. At that visit, she reported that she had been out of Lantus  and blood pressure medications for about 2 weeks. She was also only taking metformin  500 mg BID instead of 1000 mg in the morning and 500 mg at night.  Lantus  was resumed at 10 units daily and metformin  was resumed at 500 mg BID. Freestyle Libre 3 Plus was also prescribed.    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   Patient reports diabetes was diagnosed in 2020.   Family/Social History:  -Fhx: kidney failure, heart disease -Tobacco: current 0.25 PPD smoker (recently quit -- 1.5 months ago) -Alcohol: none reported  Current diabetes medications include: Lantus  10 units at bedtime (taking 20 units in the AM and 20 at night for the last 4-5 days), metformin  500 mg BID   Current hypertension medications include: amlodipine  10 mg daily, losartan  50 mg daily Current hyperlipidemia medications include: none  Patient reports adherence to taking all medications as prescribed. Tells me that she was out of insulin  for about a month prior to May.   Insurance coverage: [place] Medicaid  Patient denies hypoglycemic events.  Patient reports nocturia (nighttime urination), 2-3x/night.  Patient reports neuropathy (nerve pain), bilaterally in feet. Patient denies visual changes. Sees eye doctor in August. Patient reports self foot exams.   Patient reported dietary habits: Eats 3-4 meals/day Breakfast: Cheerios with 2% milk, eggs, bacon Lunch: Red chili, [place] or ham sandwich Dinner: 2 hogs, chicken breast, baked potatoes Snacks: cashews, peanuts, slim jims Drinks: water, zero sugar Dr.  Kathlene Paradise  Patient-reported exercise habits: walks for 15-20 minutes when possible (currently has injury so unable to walk as much now)  O:  Libre3 CGM Download today 02/28/2024 -- 14 day report % Time CGM is active: 86% Average Glucose: 203 mg/dL Time in Goal:  - Time in range 70-180: 36% - Time above range: 64% - Time below range: 0%  Lab Results  Component Value Date   HGBA1C 9.0 (A) 01/17/2024   There were no vitals filed for this visit.  Lipid Panel     Component Value Date/Time   CHOL 195 01/17/2024 1201   TRIG 88 01/17/2024 1201   HDL 106 01/17/2024 1201   CHOLHDL 1.8 01/17/2024 1201   CHOLHDL 2.7 Ratio 06/04/2008 2136   VLDL 45 (H) 06/04/2008 2136   LDLCALC 74 01/17/2024 1201    Clinical Atherosclerotic Cardiovascular Disease (ASCVD): No  The ASCVD Risk score (Arnett DK, et al., 2019) failed to calculate for the following reasons:   The valid HDL cholesterol range is 20 to 100 mg/dL    A/P: Diabetes longstanding currently uncontrolled with A1c of 9.0% above goal of < 7%. Patient is able to verbalize appropriate hypoglycemia management plan. Medication adherence appears optimal after being able to get access to her medications.  Of note, there have been several meta-analyses and statements made from clinical guidelines regarding the risk of retinopathy and GLP-1 agents. I have cited and listed those below:   -ADA Guidelines, 2021 [place]: GroupJournal.fr -Survey of Ophthalmology, 2023: MysteryFoods.com.br   -American Academy of Ophthalmology, 2024: ShoppingCondos.de.2595.638756  These trials are in agreement that GLP-1 use does not  appear to worsen diabetic retinopathy in patients. Those that were found to do so were older generation agents such as albiglutide. Additionally, retinal adverse effects associated with GLP-1 RA's appear to occur earlier in therapy vs later. For this reason, the American Academy of  Ophthalmology released a statement in 2024 that stated that diabetic retinopathy worsening was not associated with GLP-1 RA compared to other agents (SGLT-2i's in this case).   In our case, Ms. Wertenberger has a hx of retinopathy but is followed by Dr. Karyl Paget. She is due for a visit but assures us  today she is in the process of scheduling an appointment with him. I think we can safely use a less potent, weekly GLP-1 (Trulicity instead of Ozempic) at a low dose with some benefit. Additionally, an initial worsening of retinopathy in patients who take GLP-1 agents is more so associated with the rapid decrease in A1c. A low-dose Trulicity helps us  avoid a rapid decrease in A1c. While she only seldomly experiences blurry vision, she tells us  today she has not experienced this recently and will report to us  any changes after starting therapy.  -INCREASE Lantus  to 30 units daily. If BG remains above target, then okay to increase to 40 units daily.  -Continue metformin  XR 500 mg BID  -START Trulicity 0.75 mg once weekly -Patient educated on purpose, proper use, and potential adverse effects of insulin , metformin , and GLP-1.  -Extensively discussed pathophysiology of diabetes, recommended lifestyle interventions, dietary effects on blood sugar control.  -Counseled on s/sx of and management of hypoglycemia.  -Next A1c anticipated 04/2024.   Hypertension longstanding currently uncontrolled with last office visit BP reading of 180/78 (repeat 135/85). Blood pressure goal of <130/80 mmHg.  Patient had not taken BP medications that morning due to losing them. Medication adherence appears to be optimal since restarting medications. Blood pressure control is suboptimal due to non-adherence. Home BP 123/72 reported.  - Continue amlodipine  10 mg daily - Continue losartan  50 mg daily  Written patient instructions provided. Patient verbalized understanding of treatment plan.  Total time in face to face counseling 30 minutes.     Follow-up:  Pharmacist 1 month PCP clinic visit on 05/21/2024  Juleen Oakland, PharmD PGY1 Pharmacy Resident  Marene Shape, PharmD, BCACP, CPP Clinical Pharmacist [facility] [phone]

## 2024-02-28 ENCOUNTER — Other Ambulatory Visit: Payer: Self-pay

## 2024-02-28 ENCOUNTER — Telehealth: Payer: Self-pay | Admitting: Pharmacist

## 2024-02-28 ENCOUNTER — Encounter: Payer: Self-pay | Admitting: Pharmacist

## 2024-02-28 ENCOUNTER — Ambulatory Visit: Attending: Family Medicine | Admitting: Pharmacist

## 2024-02-28 DIAGNOSIS — E113293 Type 2 diabetes mellitus with mild nonproliferative diabetic retinopathy without macular edema, bilateral: Secondary | ICD-10-CM

## 2024-02-28 DIAGNOSIS — Z7984 Long term (current) use of oral hypoglycemic drugs: Secondary | ICD-10-CM | POA: Diagnosis not present

## 2024-02-28 DIAGNOSIS — Z794 Long term (current) use of insulin: Secondary | ICD-10-CM | POA: Diagnosis not present

## 2024-02-28 MED ORDER — TRULICITY 0.75 MG/0.5ML ~~LOC~~ SOAJ
0.7500 mg | SUBCUTANEOUS | 1 refills | Status: DC
  Filled 2024-02-28 – 2024-03-08 (×2): qty 2, 28d supply, fill #0
  Filled 2024-04-02: qty 2, 28d supply, fill #1

## 2024-02-28 NOTE — Telephone Encounter (Signed)
 Can we start a PA for this patient's Trulicity?

## 2024-02-29 NOTE — Therapy (Incomplete)
 OUTPATIENT PHYSICAL THERAPY SHOULDER   Patient Name: Tamara Barnes MRN: [id number] DOB:Apr 30, 1966, 58 y.o., female Today's Date: 02/29/2024  END OF SESSION:      Past Medical History:  Diagnosis Date   Diabetes mellitus without complication (HCC)    Hypertension    Past Surgical History:  Procedure Laterality Date   FRACTURE SURGERY     HAND SURGERY Left    tuabl ligation     TUBAL LIGATION     Patient Active Problem List   Diagnosis Date Noted   Mass of joint of left hand 03/08/2023   Pain of left hand 01/13/2023   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) 08/14/2020   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) 08/14/2020   Nuclear sclerotic cataract of both eyes 08/14/2020   Vitreomacular adhesion of both eyes 08/14/2020   Abdominal pain    E coli bacteremia    Pyelonephritis 04/07/2020   Sinusitis 07/04/2012   Elevated BP 07/04/2012   Rhinitis medicamentosa 07/04/2012   FIBROIDS, UTERUS 02/24/2009   UNSPECIFIED ABNORMAL MAMMOGRAM 02/24/2009   DYSFUNCTIONAL UTERINE BLEEDING 08/02/2008   ANEMIA 06/04/2008   SCIATICA 06/04/2008   TOBACCO DEPENDENCE 11/10/2006    PCP: Lawrance Presume, MD   REFERRING PROVIDER: Shauna Del, DO   REFERRING DIAG:  M25.511,G89.29,M25.512 (ICD-10-CM) - Chronic pain of both shoulders  M12.811,M12.812 (ICD-10-CM) - Rotator cuff arthropathy of both shoulders  M75.42 (ICD-10-CM) - Impingement syndrome of left shoulder  M19.011,M19.012 (ICD-10-CM) - Localized osteoarthritis of shoulders, bilateral    THERAPY DIAG:  No diagnosis found.  Rationale for Evaluation and Treatment: Rehabilitation  ONSET DATE: 3 year ago  SUBJECTIVE:                                                                                                                                                                                      SUBJECTIVE STATEMENT: Pt reports her L shoulder is doing a lot better,  Still unable to lift Right arm much.   EVAL: Pt reports approx 3 years she fell forward going down steps. She grabbed the rail with her L hand, while she used her R hand to break the fall. She broke her R hand during the fall. She notes sometime afterward both shoulders started hurting. Lifting her R arm is very limited and she uses the L hand to assist.   Hand dominance: Right  PERTINENT HISTORY: DM  PAIN:  Are you having pain? Yes: NPRS scale: R shoulder 5/10, L shoulder 0/10 Pain location: Both shoulders Pain description: constant, throb, burning Aggravating factors: raising her arm  Relieving factors: Medications, heating pad  PRECAUTIONS: None  RED FLAGS: None  WEIGHT BEARING RESTRICTIONS: No  FALLS:  Has patient fallen in last 6 months? No  LIVING ENVIRONMENT: Lives with: lives with their family Lives in: House/apartment Able to access home  OCCUPATION: Not working  PLOF: Independent with basic ADLs  PATIENT GOALS:To be able to lift her R arm above shoulder height. Better use of both arms with less pain.  NEXT MD VISIT:   OBJECTIVE:  Note: Objective measures were completed at Evaluation unless otherwise noted.  DIAGNOSTIC FINDINGS:  DG shoulders 12/01/23  Right IMPRESSION: 1. Moderate acromioclavicular and mild glenohumeral osteoarthritis. 2. Moderate spur extending anteriorly, inferiorly, and laterally from the lateral acromion.   Left FINDINGS:  Normal bone mineralization. Normal glenohumeral and  acromioclavicular alignment. Mild acromioclavicular joint space .   PATIENT SURVEYS:  Quick Dash 45/55=80%  COGNITION: Overall cognitive status: Within functional limits for tasks assessed     SENSATION: WFL  POSTURE: Forward head, rounded shoulders  UPPER EXTREMITY ROM:  Popping of the R shoulder with all movements Active ROM Right eval Left eval Right  02/16/24  Shoulder flexion  A 60 p P 120 130 A 80  Shoulder extension     Shoulder  abduction 50 p 100 p   Shoulder adduction     Shoulder internal rotation Mid glutes p L5   Shoulder external rotation Upper trap p T3   Elbow flexion     Elbow extension     Wrist flexion     Wrist extension     Wrist ulnar deviation     Wrist radial deviation     Wrist pronation     Wrist supination     P=pain (Blank rows = not tested)  UPPER EXTREMITY MMT:  Scapular weakness MMT Right eval Left eval  Shoulder flexion 4 4+  Shoulder extension    Shoulder abduction 4 4+  Shoulder adduction    Shoulder internal rotation 4 4+  Shoulder external rotation 4 p 4+  Middle trapezius    Lower trapezius    Elbow flexion    Elbow extension    Wrist flexion    Wrist extension    Wrist ulnar deviation    Wrist radial deviation    Wrist pronation    Wrist supination    Grip strength (lbs)    (Blank rows = not tested)  SHOULDER SPECIAL TESTS: Impingement tests: Hawkins/Kennedy impingement test: negative Rotator cuff assessment: Empty can test: negative and Full can test: negative Min pain c RC testing, no overt weakness  PALPATION:  TTP bilat upper traps and peri-GH joints                                                                                                                             TREATMENT DATE:  [facility] Adult PT Treatment:  DATE: 03/01/24 Therapeutic Activity: Supine clasped hand chest press AA Supine clasped hand shoulder flexion AA- increased pain Supine AA ER/IR with dowel  Supine isometrics Shoulder ext, abdct, ER , IR  Seated shoulder flexion table slides- using FR for AA Seated shoulder scaption table slides using FR for AA  Standing row GTB x 15 Standing ext GTB x 15 Standing bilat Bicep Curls YTB 2x12 Pulleys shoulder flexion  Standing shoulder isometric flexion +HEP Modalities: HMP right shoulder Therapeutic Exercise: *** Manual Therapy: *** Neuromuscular re-ed: *** Therapeutic  Activity: *** Modalities: *** Self Care: ***  [facility] Adult PT Treatment:                                                DATE: 02/16/24  Therapeutic Activity: Supine clasped hand chest press AA Supine clasped hand shoulder flexion AA- increased pain Supine AA ER/IR with dowel  Supine isometrics Shoulder ext, abdct, ER , IR  Seated shoulder flexion table slides- using FR for AA Seated shoulder scaption table slides using FR for AA  Standing row GTB x 15 Standing ext GTB x 15 Standing bilat Bicep Curls YTB 2x12 Pulleys shoulder flexion  Standing shoulder isometric flexion +HEP Modalities: HMP right shoulder    [facility] Adult PT Treatment:                                                DATE: 02/14/24 Therapeutic Activities: Pulley shoulder flexion Standing bilat shoulder External Rotation YTB 2x12 Shoulder extension GTB 2x15 Shoulder row GTB 2x15 Shoulder IR GTB 2x12, each Standing bilat Bicep Curls YTB 2x12 Therapeutic Exercises: S/L R shoulder ER 2# 2x12, R 1# 2x10 S/L R shoulder abd to 90d palm down 3x8 S/L R shoulder flex to 90d 3x5  PATIENT EDUCATION: Education details: Eval findings, POC, HEP Person educated: Patient Education method: Explanation, Demonstration, Tactile cues, Verbal cues, and Handouts Education comprehension: verbalized understanding, returned demonstration, verbal cues required, and tactile cues required  HOME EXERCISE PROGRAM: Access Code: [email or web address] URL: [email or web address] Date: 02/09/2024 Prepared by: Liborio Reeds  Exercises - Supine Shoulder External Rotation with Resistance  - 1 x daily - 7 x weekly - 3 sets - 10 reps - 3 hold - Shoulder extension with resistance - Neutral  - 1 x daily - 7 x weekly - 3 sets - 10 reps - 3 hold - Standing Shoulder Row with Anchored Resistance  - 1 x daily - 7 x weekly - 3 sets - 10 reps - 3 hold - Standing Bicep Curls with Resistance  - 1 x daily - 7 x weekly - 3 sets - 10 reps - 3 hold - Isometric  Shoulder Flexion at Wall  - 1 x daily - 7 x weekly - 1-2 sets - 10 reps - 5 hold  ASSESSMENT:  CLINICAL IMPRESSION: PT was completed for RC and periscapular strengthening for both shoulders. Pt was able to better tolerate R flexion via AAROM and isometrics. Updated HEP. Pt tolerated prescribed exercises in PT today without adverse effects.  OBJECTIVE IMPAIRMENTS: decreased ROM, decreased strength, impaired UE functional use, postural dysfunction, and pain.   ACTIVITY LIMITATIONS: carrying, lifting, bathing, dressing, reach over head, and caring for others  PARTICIPATION LIMITATIONS: meal prep, cleaning, laundry,  and shopping  PERSONAL FACTORS: Fitness, Past/current experiences, Time since onset of injury/illness/exacerbation, and 1 comorbidity: DM are also affecting patient's functional outcome.   REHAB POTENTIAL: Good  CLINICAL DECISION MAKING: Evolving/moderate complexity  EVALUATION COMPLEXITY: Moderate   GOALS:  SHORT TERM GOALS = LTGs  LONG TERM GOALS: Target date: 03/16/24  Pt will be Ind in a final HEP to maintain achieved LOF  Baseline: started Goal status: INITIAL  2.  Increase AROM of the R shoulder for improved function with overhead reaching, dressing, and bathing  Baseline:  Goal status: INITIAL  3.  Increase R shoulder strength to 4+/5 for improved functional use Baseline:  Goal status: INITIAL  4.  Pt will report 50% improvement in bilat shoulder pain for improved function and QOL Baseline: 3-10/10 Goal status: INITIAL  5.  Pt's Quick Dash score will improve by the MCID to 66% as indication of improved function  Baseline: 80% Goal status: INITIAL  PLAN:  PT FREQUENCY: 2x/week  PT DURATION: 6 weeks  PLANNED INTERVENTIONS: 97164- PT Re-evaluation, 97110-Therapeutic exercises, 97530- Therapeutic activity, 97112- Neuromuscular re-education, 97535- Self Care, 13086- Manual therapy, [id number]- Aquatic Therapy, [id number]- Electrical stimulation (unattended), [phone]-  Ionotophoresis 4mg /ml Dexamethasone , Patient/Family education, Taping, Dry Needling, Joint mobilization, Cryotherapy, and Moist heat  PLAN FOR NEXT SESSION: Assess response to HEP; progress therex as indicated; use of modalities, manual therapy; and TPDN as indicated.   Roxane Puerto MS, PT 02/29/24 9:11 AM

## 2024-03-01 ENCOUNTER — Encounter

## 2024-03-06 ENCOUNTER — Telehealth: Payer: Self-pay

## 2024-03-06 NOTE — Telephone Encounter (Signed)
 LVM re: pt's question about her returning to PT for her shoulders following recent ankle surgery. Pt was advised she can return to rehab for her shoulder for 2x per week until 03/23/24 without a new referral. Pt is to call the front office to set up her appts.

## 2024-03-08 ENCOUNTER — Other Ambulatory Visit: Payer: Self-pay

## 2024-03-09 ENCOUNTER — Other Ambulatory Visit: Payer: Self-pay

## 2024-03-12 ENCOUNTER — Other Ambulatory Visit: Payer: Self-pay

## 2024-03-14 ENCOUNTER — Other Ambulatory Visit: Payer: Self-pay

## 2024-03-24 DIAGNOSIS — Z419 Encounter for procedure for purposes other than remedying health state, unspecified: Secondary | ICD-10-CM | POA: Diagnosis not present

## 2024-03-30 ENCOUNTER — Ambulatory Visit: Attending: Internal Medicine | Admitting: Pharmacist

## 2024-03-30 ENCOUNTER — Encounter: Payer: Self-pay | Admitting: Pharmacist

## 2024-03-30 ENCOUNTER — Other Ambulatory Visit: Payer: Self-pay

## 2024-03-30 DIAGNOSIS — Z794 Long term (current) use of insulin: Secondary | ICD-10-CM

## 2024-03-30 DIAGNOSIS — E113293 Type 2 diabetes mellitus with mild nonproliferative diabetic retinopathy without macular edema, bilateral: Secondary | ICD-10-CM | POA: Diagnosis not present

## 2024-03-30 DIAGNOSIS — Z7984 Long term (current) use of oral hypoglycemic drugs: Secondary | ICD-10-CM

## 2024-03-30 DIAGNOSIS — Z7985 Long-term (current) use of injectable non-insulin antidiabetic drugs: Secondary | ICD-10-CM | POA: Diagnosis not present

## 2024-03-30 MED ORDER — NICOTINE 7 MG/24HR TD PT24
7.0000 mg | MEDICATED_PATCH | Freq: Every day | TRANSDERMAL | 0 refills | Status: AC
  Filled 2024-03-30: qty 28, 28d supply, fill #0

## 2024-03-30 MED ORDER — LANTUS SOLOSTAR 100 UNIT/ML ~~LOC~~ SOPN
20.0000 [IU] | PEN_INJECTOR | Freq: Every day | SUBCUTANEOUS | 1 refills | Status: DC
  Filled 2024-03-30: qty 15, 75d supply, fill #0

## 2024-03-30 NOTE — Progress Notes (Signed)
 S:     No chief complaint on file.  58 y.o. female who presents for diabetes evaluation, education, and management. Patient arrives in good spirits and presents without any assistance. 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   Patient was referred and last seen by Primary Care Provider, Dr. Vicci, on 01/17/24. At that visit, she reported that she had been out of Lantus  and blood pressure medications for about 2 weeks. She was also only taking metformin  500 mg BID instead of 1000 mg in the morning and 500 mg at night.  Lantus  was resumed at 10 units daily and metformin  was continued at 500 mg BID. Freestyle Libre 3 Plus was also prescribed.    Pharmacy saw her after on 02/28/2024. We added Trulicity  at that visit.   Today, she is doing well. Denies any NV, abdominal pain. She denies any changes in vision since being on the Trulicity . She brings in her Herlene 3 reader for review. She is feeling much better overall since her last visit and can tell her sugars are improving.  Family/Social History:  -Fhx: kidney failure, heart disease -Tobacco: current 0.25 PPD smoker (recently quit -- 1.5 months ago) -Alcohol: none reported  Current diabetes medications include: Lantus  20 units at bedtime, metformin  XR 500 mg BID, Trulicity  0.75 mg weekly. Patient reports adherence to taking all medications as prescribed.   Insurance coverage: [place] Medicaid  Patient denies hypoglycemic events.  Patient denies polyuria, polydipsia.  Patient reports stable neuropathy (nerve pain), bilaterally in feet. Patient denies visual changes. Sees eye doctor in August. Patient reports self foot exams.   Patient reported dietary habits: Eats 3-4 meals/day Breakfast: Cheerios with 2% milk, eggs, bacon Lunch: Red chili, [place] or ham sandwich Dinner: 2 hogs, chicken breast, baked potatoes Snacks: cashews, peanuts, slim jims Drinks:  water, zero sugar Dr. Nunzio  Patient-reported exercise habits: walks for 15-20 minutes when possible (currently has injury so unable to walk as much now)  O:  Libre3 CGM Download today (03/30/2024) -- 30 day report % Time CGM is active: 99% Average Glucose: 146 mg/dL Time in Goal:  - Time in range 70-180: 81% - Time above range: 19% - Time below range: 0%  Lab Results  Component Value Date   HGBA1C 9.0 (A) 01/17/2024   There were no vitals filed for this visit.  Lipid Panel     Component Value Date/Time   CHOL 195 01/17/2024 1201   TRIG 88 01/17/2024 1201   HDL 106 01/17/2024 1201   CHOLHDL 1.8 01/17/2024 1201   CHOLHDL 2.7 Ratio 06/04/2008 2136   VLDL 45 (H) 06/04/2008 2136   LDLCALC 74 01/17/2024 1201    Clinical Atherosclerotic Cardiovascular Disease (ASCVD): No  The ASCVD Risk score (Arnett DK, et al., 2019) failed to calculate for the following reasons:   The valid HDL cholesterol range is 20 to 100 mg/dL    A/P: Diabetes longstanding currently uncontrolled with A1c of 9.0%, above goal of < 7%. Patient is not currently hypoglycemic but is able to verbalize appropriate hypoglycemia management plan. Her symptoms of hyperglycemia have all but resolved. Of note, she has chronic neuropathy and retinopathy. Both appear stable at this time, and she specifically denies any changes in vision since adding Trulicity  to her regimen. Medication adherence appears optimal. -Continue Lantus  20 units daily.  -Continue metformin  XR 500 mg BID  -Continue Trulicity  0.75 mg once weekly -Patient educated on purpose,  proper use, and potential adverse effects of insulin , metformin , and GLP-1.  -Extensively discussed pathophysiology of diabetes, recommended lifestyle interventions, dietary effects on blood sugar control.  -Counseled on s/sx of and management of hypoglycemia.  -Next A1c anticipated 04/2024.   Smoking cessation: of note, patient has now gone 2 months without a cigarette. Would  like to step NRT (patch) down to the 7 mg daily. -Rxn sent for NRT, patch, at 7mg /24 hours.  Written patient instructions provided. Patient verbalized understanding of treatment plan.  Total time in face to face counseling 30 minutes.    Follow-up:  Pharmacist 1 month PCP clinic visit on 05/21/2024  Herlene Fleeta Morris, PharmD, BCACP, CPP Clinical Pharmacist [facility] [phone]

## 2024-04-02 ENCOUNTER — Other Ambulatory Visit: Payer: Self-pay

## 2024-04-04 ENCOUNTER — Other Ambulatory Visit: Payer: Self-pay

## 2024-04-10 ENCOUNTER — Encounter: Payer: Self-pay | Admitting: Podiatry

## 2024-04-10 ENCOUNTER — Ambulatory Visit (INDEPENDENT_AMBULATORY_CARE_PROVIDER_SITE_OTHER): Admitting: Podiatry

## 2024-04-10 ENCOUNTER — Other Ambulatory Visit: Payer: Self-pay

## 2024-04-10 DIAGNOSIS — S86012A Strain of left Achilles tendon, initial encounter: Secondary | ICD-10-CM | POA: Diagnosis not present

## 2024-04-10 MED ORDER — IBUPROFEN 800 MG PO TABS
800.0000 mg | ORAL_TABLET | Freq: Three times a day (TID) | ORAL | 3 refills | Status: AC
  Filled 2024-04-10: qty 90, 30d supply, fill #0
  Filled 2024-05-06: qty 90, 30d supply, fill #1
  Filled 2024-08-13: qty 90, 30d supply, fill #2
  Filled 2024-09-24: qty 90, 30d supply, fill #3

## 2024-04-10 MED ORDER — DICLOFENAC SODIUM 75 MG PO TBEC
75.0000 mg | DELAYED_RELEASE_TABLET | Freq: Two times a day (BID) | ORAL | 3 refills | Status: AC
  Filled 2024-04-10: qty 60, 30d supply, fill #0

## 2024-04-10 NOTE — Progress Notes (Signed)
 She presents today for follow-up of her rupture Achilles left foot.  States that she was never able to get the MRI done but they never contacted her.  States that she is has a rash she thinks it may be associated with the Celebrex .  Objective: Still has severe pain on palpation of the Achilles and no plantarflexion against resistance.  Fluctuance is noted on palpation of the Achilles.  Void is noted as well.  Assessment: Rupture of the Achilles tendon left.  Plan: Were going to have her MRI done she was actually already approved for that but they never contacted her with the date.  She is will give them a call.  Provide discontinue the Celebrex  and start diclofenac  diclofenac  75 mg 1 p.o. twice daily #63 refill.  We also redispensed another cam boot.

## 2024-04-11 ENCOUNTER — Other Ambulatory Visit: Payer: Self-pay

## 2024-04-17 ENCOUNTER — Ambulatory Visit
Admission: RE | Admit: 2024-04-17 | Discharge: 2024-04-17 | Disposition: A | Discharge status: Home / Self Care | Source: Ambulatory Visit | Attending: Podiatry | Admitting: Podiatry

## 2024-04-17 DIAGNOSIS — M722 Plantar fascial fibromatosis: Secondary | ICD-10-CM | POA: Diagnosis not present

## 2024-04-17 DIAGNOSIS — R600 Localized edema: Secondary | ICD-10-CM | POA: Diagnosis not present

## 2024-04-17 DIAGNOSIS — S86012A Strain of left Achilles tendon, initial encounter: Secondary | ICD-10-CM

## 2024-04-24 DIAGNOSIS — Z419 Encounter for procedure for purposes other than remedying health state, unspecified: Secondary | ICD-10-CM | POA: Diagnosis not present

## 2024-04-26 ENCOUNTER — Other Ambulatory Visit (HOSPITAL_BASED_OUTPATIENT_CLINIC_OR_DEPARTMENT_OTHER): Payer: Self-pay

## 2024-04-27 ENCOUNTER — Telehealth: Payer: Self-pay | Admitting: Internal Medicine

## 2024-04-27 NOTE — Telephone Encounter (Signed)
 Confirmed appt for 8/18

## 2024-04-30 ENCOUNTER — Other Ambulatory Visit: Payer: Self-pay

## 2024-04-30 ENCOUNTER — Ambulatory Visit: Attending: Internal Medicine | Admitting: Pharmacist

## 2024-04-30 DIAGNOSIS — Z794 Long term (current) use of insulin: Secondary | ICD-10-CM | POA: Diagnosis not present

## 2024-04-30 DIAGNOSIS — E113293 Type 2 diabetes mellitus with mild nonproliferative diabetic retinopathy without macular edema, bilateral: Secondary | ICD-10-CM | POA: Diagnosis not present

## 2024-04-30 DIAGNOSIS — Z7985 Long-term (current) use of injectable non-insulin antidiabetic drugs: Secondary | ICD-10-CM

## 2024-04-30 DIAGNOSIS — Z7984 Long term (current) use of oral hypoglycemic drugs: Secondary | ICD-10-CM | POA: Diagnosis not present

## 2024-04-30 LAB — POCT GLYCOSYLATED HEMOGLOBIN (HGB A1C): HbA1c, POC (controlled diabetic range): 7.7 % — AB (ref 0.0–7.0)

## 2024-04-30 MED ORDER — LANTUS SOLOSTAR 100 UNIT/ML ~~LOC~~ SOPN
10.0000 [IU] | PEN_INJECTOR | Freq: Every day | SUBCUTANEOUS | 1 refills | Status: AC
  Filled 2024-04-30 – 2024-05-06 (×2): qty 15, 150d supply, fill #0

## 2024-04-30 NOTE — Progress Notes (Unsigned)
 S:     No chief complaint on file.  58 y.o. female who presents for diabetes evaluation, education, and management. Patient arrives in good spirits and presents without any assistance. 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   Patient was referred and last seen by Primary Care Provider, Dr. Vicci, on 01/17/24. Patient last saw pharmacy on 03/26/24. At that visit, there were no medication changes made. She noted that her sugars were improving and she was feeling good.  Today, she is doing well. She is returning back from a trip to [place] . She states that her Herlene sensors have fallen off. Reports not keeping the box and was unaware of [employer] replacement policy.  Denies any N/V with her current Trulicity  dose. While she does not experience lows often, she verbalizes a hypoglycemia plan. She has not been taking Lantus  as prescribed due to the fear of going low. Patient did mention that she has nosebleeds, but could be related to sinus issues and dry air.  Family/Social History: -Fhx: kidney failure, heart disease -Tobacco: current 0.25 PPD smoker (recently quit -- 1.5 months ago) -Alcohol: none reported  Current diabetes medications include: Lantus  20 units once once or twice a week to avoid hypoglycemia events, metformin  500 mg BID, Trulicity  0.75 mg once weekly  Insurance coverage: [place] [employer]  Patient reports hypoglycemic events, but they have not happened in a while.  Patient denies nocturia (nighttime urination).  Patient denies neuropathy (nerve pain). Patient denies visual changes. Patient reports self foot exams.   Patient reported dietary habits: Eats 3-4 meals/day Breakfast: Cheerios with 2% milk, eggs, bacon Lunch: Red chili, [place] or ham sandwich Dinner: heavy on protein (chicken, fish, pork chop, stay away from ham, burgers), no pasta, eating less potatoes Snacks: cashews, peanuts, slim  jims Drinks: water, zero sugar Dr. Nunzio  Patient-reported exercise habits: walks for 15-20 minutes when possible even with her boot on(currently has injury so unable to walk as much now)   O:   ROS  Physical Exam  Libre3 CGM Download today (04/30/24) for 90 days % Time CGM is active: 59% Average Glucose: 156 mg/dL Time in Goal:  - Time in range 70-180: 74% - Time above range: 26% - Time below range: 0% Observed patterns:  Lab Results  Component Value Date   HGBA1C 7.7 (A) 04/30/2024   There were no vitals filed for this visit.  Lipid Panel     Component Value Date/Time   CHOL 195 01/17/2024 1201   TRIG 88 01/17/2024 1201   HDL 106 01/17/2024 1201   CHOLHDL 1.8 01/17/2024 1201   CHOLHDL 2.7 Ratio 06/04/2008 2136   VLDL 45 (H) 06/04/2008 2136   LDLCALC 74 01/17/2024 1201    Clinical Atherosclerotic Cardiovascular Disease (ASCVD): No  The ASCVD Risk score (Arnett DK, et al., 2019) failed to calculate for the following reasons:   The valid HDL cholesterol range is 20 to 100 mg/dL   A/P: Diabetes longstanding currently above goal but improving with A1c of 7.7%, which is down from 9.0% on 5/6. Patient's adherence to medications is suboptimal. She is taking Lantus  once or twice a week, but takes other medications as directed. Patient is able to verbalize appropriate hypoglycemia management plan with double checking with a finger stick and using Reeces to bring sugar up. With reports of N/V and fear of experiencing hypoglycemia, plan to keep Trulicity  the same and decrease insulin . -Decreased dose of basal insulin   Lantus  (insulin  glargine) from 20 units to 10 units daily in the morning. Encouraged daily use.  -Continued GLP-1 Trulicity  (dulaglutide ) 0.75 mg.  -Patient educated on purpose, proper use, and potential adverse effects of Trulicity .  -Extensively discussed pathophysiology of diabetes, recommended lifestyle interventions, dietary effects on blood sugar control.   -Counseled on s/sx of and management of hypoglycemia.  -Next A1c anticipated 07/2024.   Written patient instructions provided. Patient verbalized understanding of treatment plan.  Total time in face to face counseling 30 minutes.    Follow-up:  Pharmacist in October PCP clinic visit on 05/21/24 with Dr. Vicci Jenkins Graces, PharmD PGY1 Pharmacy Resident [phone]

## 2024-05-02 ENCOUNTER — Encounter: Payer: Self-pay | Admitting: Pharmacist

## 2024-05-06 ENCOUNTER — Other Ambulatory Visit: Payer: Self-pay | Admitting: Internal Medicine

## 2024-05-07 ENCOUNTER — Other Ambulatory Visit: Payer: Self-pay

## 2024-05-08 ENCOUNTER — Encounter: Payer: Self-pay | Admitting: Podiatry

## 2024-05-08 ENCOUNTER — Ambulatory Visit (INDEPENDENT_AMBULATORY_CARE_PROVIDER_SITE_OTHER): Admitting: Podiatry

## 2024-05-08 ENCOUNTER — Other Ambulatory Visit: Payer: Self-pay

## 2024-05-08 DIAGNOSIS — M7662 Achilles tendinitis, left leg: Secondary | ICD-10-CM

## 2024-05-08 DIAGNOSIS — M722 Plantar fascial fibromatosis: Secondary | ICD-10-CM | POA: Diagnosis not present

## 2024-05-08 MED ORDER — TRULICITY 0.75 MG/0.5ML ~~LOC~~ SOAJ
0.7500 mg | SUBCUTANEOUS | 1 refills | Status: DC
  Filled 2024-05-08: qty 2, 28d supply, fill #0
  Filled 2024-06-13 (×2): qty 2, 28d supply, fill #1

## 2024-05-09 NOTE — Progress Notes (Signed)
 She presents today to go over the results of her MRI regarding her left Achilles tendon.  She states that it feels about the same some days are better than others.  Objective: Vital signs stable oriented x 3 left ankle is still swollen very tender in the Achilles area.  Pulses are strongly palpable.  No calf pain.  MRI does demonstrate chronic Achilles tendinosis and plantar fasciitis with minimal tearing.  Assessment: Plantar fasciitis Achilles tendinitis with small interstitial tears.  Plan: At this point we are going to try physical therapy and continue the boot.  I will follow-up with her once they have completed their therapy and if necessary surgery will be discussed.

## 2024-05-11 ENCOUNTER — Other Ambulatory Visit: Payer: Self-pay

## 2024-05-18 ENCOUNTER — Telehealth: Payer: Self-pay | Admitting: Internal Medicine

## 2024-05-18 DIAGNOSIS — E113393 Type 2 diabetes mellitus with moderate nonproliferative diabetic retinopathy without macular edema, bilateral: Secondary | ICD-10-CM | POA: Diagnosis not present

## 2024-05-18 DIAGNOSIS — H2513 Age-related nuclear cataract, bilateral: Secondary | ICD-10-CM | POA: Diagnosis not present

## 2024-05-18 DIAGNOSIS — H35033 Hypertensive retinopathy, bilateral: Secondary | ICD-10-CM | POA: Diagnosis not present

## 2024-05-18 DIAGNOSIS — H40033 Anatomical narrow angle, bilateral: Secondary | ICD-10-CM | POA: Diagnosis not present

## 2024-05-18 NOTE — Telephone Encounter (Signed)
 Called patient to confirm upcoming appointment 05/21/2024. Patient appointment has been successfully confirmed

## 2024-05-21 ENCOUNTER — Ambulatory Visit: Attending: Sports Medicine | Admitting: Physical Therapy

## 2024-05-21 ENCOUNTER — Ambulatory Visit: Admitting: Internal Medicine

## 2024-05-21 NOTE — Therapy (Incomplete)
 OUTPATIENT PHYSICAL THERAPY LOWER EXTREMITY EVALUATION   Patient Name: Tamara Barnes MRN: [id number] DOB:August 16, 1966, 58 y.o., female Today's Date: 05/21/2024  END OF SESSION:   Past Medical History:  Diagnosis Date   Diabetes mellitus without complication (HCC)    Hypertension    Past Surgical History:  Procedure Laterality Date   FRACTURE SURGERY     HAND SURGERY Left    tuabl ligation     TUBAL LIGATION     Patient Active Problem List   Diagnosis Date Noted   Mass of joint of left hand 03/08/2023   Pain of left hand 01/13/2023   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) 08/14/2020   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) 08/14/2020   Nuclear sclerotic cataract of both eyes 08/14/2020   Vitreomacular adhesion of both eyes 08/14/2020   Abdominal pain    E coli bacteremia    Pyelonephritis 04/07/2020   Sinusitis 07/04/2012   Elevated BP 07/04/2012   Rhinitis medicamentosa 07/04/2012   FIBROIDS, UTERUS 02/24/2009   UNSPECIFIED ABNORMAL MAMMOGRAM 02/24/2009   DYSFUNCTIONAL UTERINE BLEEDING 08/02/2008   ANEMIA 06/04/2008   SCIATICA 06/04/2008   TOBACCO DEPENDENCE 11/10/2006    PCP: Vicci Barnie NOVAK, MD  REFERRING PROVIDER: Verta Royden DASEN, DPM  REFERRING DIAG: Achilles tendinitis of left lower extremity [M76.62], Plantar fasciitis of left foot [M72.2]   Rationale for Evaluation and Treatment: Rehabilitation  THERAPY DIAG:  No diagnosis found.  PERTINENT HISTORY: B visual impairment, anemia  WEIGHT BEARING RESTRICTIONS: No  FALLS:  Has patient fallen in last 6 months? {fallsyesno:27318}  LIVING ENVIRONMENT: Lives with: {OPRC lives with:25569::lives with their family} Lives in: {Lives in:25570} Stairs: {opstairs:27293} Has following equipment at home: {Assistive devices:23999}  OCCUPATION: ***   PRECAUTIONS: {Therapy  precautions:24002} ---------------------------------------------------------------------------------------------  SUBJECTIVE:   SUBJECTIVE STATEMENT: Eval statement 05/21/2024: ***  RED FLAGS: {PT Red Flags:29287}   PLOF: {PLOF:24004}  PATIENT GOALS: ***  NEXT MD VISIT: *** ---------------------------------------------------------------------------------------------  OBJECTIVE:  Note: Objective measures were completed at Evaluation unless otherwise noted.  DIAGNOSTIC FINDINGS: ***  PATIENT SURVEYS:  {rehab surveys:24030}  COGNITION: Overall cognitive status: {cognition:24006}     SENSATION: {sensation:27233}  EDEMA:  ***  MUSCLE LENGTH: Hamstrings: Right *** deg; Left *** deg Debby test: Right *** deg; Left *** deg  POSTURE: {posture:25561}  PALPATION: ***  LOWER EXTREMITY ROM:  {AROM/PROM:27142} ROM Right eval Left eval  Hip flexion    Hip extension    Hip abduction    Hip adduction    Hip internal rotation    Hip external rotation    Knee flexion    Knee extension    Ankle dorsiflexion    Ankle plantarflexion    Ankle inversion    Ankle eversion     (Blank rows = not tested)  ! Indicates pain with testing  LOWER EXTREMITY MMT:  MMT Right eval Left eval  Hip flexion    Hip extension    Hip abduction    Hip adduction    Hip internal rotation    Hip external rotation    Knee flexion    Knee extension    Ankle dorsiflexion    Ankle plantarflexion    Ankle inversion    Ankle eversion     (Blank rows = not tested)  ! Indicates pain with testing  LOWER EXTREMITY SPECIAL TESTS:  {LEspecialtests:26242}  FUNCTIONAL TESTS:  {Functional tests:24029}  GAIT: Distance walked: *** Assistive device utilized: {Assistive devices:23999} Level of assistance: {Levels of assistance:24026} Comments: ***  [facility] Adult PT  Treatment:                                                DATE: 05/21/2024  Therapeutic Exercise: *** Manual Therapy: *** Neuromuscular re-ed: *** Therapeutic Activity: *** Modalities: *** Self Care: Pt education, detailed below POC discussion    PATIENT EDUCATION:  Education details: Pt received education regarding HEP performance, ADL performance, functional activity tolerance, impairment education, appropriate performance of therapeutic activities.*** Person educated: {Person educated:25204} Education method: {Education Method:25205} Education comprehension: {Education Comprehension:25206}  HOME EXERCISE PROGRAM: *** ---------------------------------------------------------------------------------------------  ASSESSMENT:  CLINICAL IMPRESSION: Eval impression (05/21/2024): Pt. attended today's physical therapy session for evaluation of ***. Pt has complaints of ***. Pt has notable deficits with ***.  Signs and symptoms are concurrent with ***. Pt would benefit from therapeutic focus on ***.  Treatment performed today focused on *** Pt demonstrated *** understanding of education provided. required *** cues and *** assistance for appropriate performance with today's activities. Pt requires the intervention of skilled outpatient physical therapy to address the aforementioned deficits and progress towards a functional level in line with therapeutic goals.   OBJECTIVE IMPAIRMENTS: {opptimpairments:25111}.   ACTIVITY LIMITATIONS: {activitylimitations:27494}  PARTICIPATION LIMITATIONS: {participationrestrictions:25113}  PERSONAL FACTORS: {Personal factors:25162} are also affecting patient's functional outcome.   REHAB POTENTIAL: {rehabpotential:25112}  CLINICAL DECISION MAKING: {clinical decision making:25114}  EVALUATION COMPLEXITY: {Evaluation complexity:25115}   GOALS: Goals reviewed with patient? YES  SHORT TERM GOALS: Target date: *** Pt will be independent with  administered HEP to demonstrate the competency necessary for long term managemnet of symptoms at home.  Baseline: Goal status: INITIAL  2.  *** Baseline:  Goal status: INITIAL  3.  *** Baseline:  Goal status: INITIAL  4.  *** Baseline:  Goal status: INITIAL  5.  *** Baseline:  Goal status: INITIAL  6.  *** Baseline:  Goal status: INITIAL  LONG TERM GOALS: Target date: ***  Pt. Will achieve a LEFS score of *** as to demonstrate improvement in self-perceived functional ability with daily activities.  Baseline:  Goal status: INITIAL  2.  Pt will improve Global Hip/knee strength to a ***/5 to demonstrate improvement in strength for quality of motion and activity performance.  Baseline:  Goal status: INITIAL  3.  *** Baseline:  Goal status: INITIAL  4.  *** Baseline:  Goal status: INITIAL  5.  *** Baseline:  Goal status: INITIAL  6.  *** Baseline:  Goal status: INITIAL  --------------------------------------------------------------------------------------------- PLAN:  PT FREQUENCY: 1-2x/week  PT DURATION: 6 weeks  PLANNED INTERVENTIONS: 97110-Therapeutic exercises, 97530- Therapeutic activity, 97112- Neuromuscular re-education, 97535- Self Care, 02859- Manual therapy, [phone]- Gait training, Patient/Family education, Balance training, Stair training, Taping, Joint mobilization, and DME instructions  PLAN FOR NEXT SESSION: Review HEP, Begin POC as detailed in the assessment   Mabel Kiang, PT, DPT 05/21/2024, 7:39 AM

## 2024-05-25 DIAGNOSIS — Z419 Encounter for procedure for purposes other than remedying health state, unspecified: Secondary | ICD-10-CM | POA: Diagnosis not present

## 2024-06-13 ENCOUNTER — Other Ambulatory Visit: Payer: Self-pay

## 2024-06-13 ENCOUNTER — Ambulatory Visit: Payer: Self-pay

## 2024-06-13 NOTE — Telephone Encounter (Signed)
 FYI Only or Action Required?: FYI only for provider.  Patient was last seen in primary care on 01/17/2024 by Vicci Barnie NOVAK, MD.  Called Nurse Triage reporting Sinusitis.  Symptoms began a week ago.  Interventions attempted: OTC medications: Sudafed, saline nasal spray.  Symptoms are: unchanged.  Triage Disposition: See PCP When Office is Open (Within 3 Days)  Patient/caregiver understands and will follow disposition?: Yes  **Appt. Scheduled for 10/6**       Copied from CRM #[id number]. Topic: Clinical - Red Word Triage >> Jun 13, 2024  3:38 PM Tamara Barnes wrote: Red Word that prompted transfer to Nurse Triage: pressure/pain with her sinus   ----------------------------------------------------------------------- From previous Reason for Contact - Scheduling: Patient/patient representative is calling to schedule an appointment. Refer to attachments for appointment information. Reason for Disposition  [1] Sinus congestion (pressure, fullness) AND [2] present > 10 days  Answer Assessment - Initial Assessment Questions 1. LOCATION: Where does it hurt?      Behind nose, into the eyes  2. ONSET: When did the sinus pain start?  (e.g., hours, days)      X 1 week   3. SEVERITY: How bad is the pain?   (Scale 0-10; or none, mild, moderate or severe)     Moderate  4. RECURRENT SYMPTOM: Have you ever had sinus problems before? If Yes, ask: When was the last time? and What happened that time?       Yes, once a year due to the weather change   5. NASAL CONGESTION: Is the nose blocked? If Yes, ask: Can you open it or must you breathe through your mouth?      Nasal congestion noted, able to breathe from nose   6. NASAL DISCHARGE: Do you have discharge from your nose? If so ask, What color?      Small amount   7. FEVER: Do you have a fever? If Yes, ask: What is it, how was it measured, and when did it start?      No   8. OTHER SYMPTOMS: Do you have any  other symptoms? (e.g., sore throat, cough, earache, difficulty breathing)  No .   Patient taking saline nasal spray, and OTC Sudafed. Appt. Scheduled for 10/6  Protocols used: Sinus Pain or Congestion-A-AH

## 2024-06-14 ENCOUNTER — Other Ambulatory Visit: Payer: Self-pay

## 2024-06-14 ENCOUNTER — Telehealth: Payer: Self-pay | Admitting: Internal Medicine

## 2024-06-14 NOTE — Telephone Encounter (Signed)
 Called pt to reminf themabt appt. Pt did not answer and LVM

## 2024-06-15 ENCOUNTER — Other Ambulatory Visit: Payer: Self-pay

## 2024-06-18 ENCOUNTER — Encounter: Payer: Self-pay | Admitting: Nurse Practitioner

## 2024-06-18 ENCOUNTER — Ambulatory Visit: Attending: Nurse Practitioner | Admitting: Nurse Practitioner

## 2024-06-18 ENCOUNTER — Other Ambulatory Visit: Payer: Self-pay

## 2024-06-18 VITALS — BP 127/74 | HR 80 | Resp 19 | Ht 62.0 in | Wt 166.2 lb

## 2024-06-18 DIAGNOSIS — J019 Acute sinusitis, unspecified: Secondary | ICD-10-CM

## 2024-06-18 DIAGNOSIS — B9689 Other specified bacterial agents as the cause of diseases classified elsewhere: Secondary | ICD-10-CM

## 2024-06-18 MED ORDER — IPRATROPIUM BROMIDE 0.03 % NA SOLN
2.0000 | Freq: Two times a day (BID) | NASAL | 0 refills | Status: AC
  Filled 2024-06-18: qty 30, 43d supply, fill #0

## 2024-06-18 MED ORDER — PROMETHAZINE-DM 6.25-15 MG/5ML PO SYRP
5.0000 mL | ORAL_SOLUTION | Freq: Four times a day (QID) | ORAL | 0 refills | Status: AC | PRN
  Filled 2024-06-18: qty 240, 12d supply, fill #0

## 2024-06-18 MED ORDER — AMOXICILLIN-POT CLAVULANATE 875-125 MG PO TABS
1.0000 | ORAL_TABLET | Freq: Two times a day (BID) | ORAL | 0 refills | Status: AC
  Filled 2024-06-18: qty 14, 7d supply, fill #0

## 2024-06-18 NOTE — Progress Notes (Signed)
 OTC taken with relief. Symptoms started 3 weeks ago.

## 2024-06-18 NOTE — Progress Notes (Signed)
 Assessment & Plan:  Tamara Barnes was seen today for nasal congestion and cough.  Diagnoses and all orders for this visit:  Acute bacterial sinusitis -     promethazine -dextromethorphan (PROMETHAZINE -DM) 6.25-15 MG/5ML syrup; Take 5 mLs by mouth 4 (four) times daily as needed for cough. -     amoxicillin -clavulanate (AUGMENTIN ) 875-125 MG tablet; Take 1 tablet by mouth 2 (two) times daily for 7 days. -     ipratropium (ATROVENT) 0.03 % nasal spray; Place 2 sprays into both nostrils every 12 (twelve) hours. Recurrent acute sinusitis with severe symptoms. Previous nasal sprays ineffective or caused adverse effects. - Prescribe antibiotics for sinusitis. - Prescribe Atrovent nasal spray.  Cough Cough related to sinusitis, managed with OTC medications. Insurance coverage for cough syrup uncertain. - Prescribe cough syrup.   Patient has been counseled on age-appropriate routine health concerns for screening and prevention. These are reviewed and up-to-date. Referrals have been placed accordingly. Immunizations are up-to-date or declined.    Subjective:   Chief Complaint  Patient presents with   Nasal Congestion   Cough   History of Present Illness Tamara Barnes is a 58 year old female who presents with symptoms of sinusitis She is a patient of Dr. Vicci  She reports experiencing a severe sinus infection annually, with this year's symptoms including nasal congestion, inability to breathe through her nose, and a sensation of facial fullness, sinus pressure and pain. Additionally, she has a cough accompanying her sinus symptoms this year.  She has tried various over-the-counter medications such as Nyquil, DayQuil, Sudafed, but her symptoms persist. Her ears have been draining slightly and were aching the previous night. She is a smoker. Her cough sometimes disrupts her sleep, but she is able to return to sleep after a couple of minutes. She is currently taking 800 mg ibuprofen  for pain  relief, which helps with headaches.  She has experienced epistaxis on the right side with using sinex sinus spray. States flonase  has been ineffective in the past. .    Review of Systems  Constitutional:  Negative for fever, malaise/fatigue and weight loss.  HENT:  Positive for congestion, ear pain and sinus pain. Negative for nosebleeds.   Respiratory:  Positive for cough. Negative for shortness of breath.   Cardiovascular: Negative.  Negative for chest pain, palpitations and leg swelling.  Gastrointestinal: Negative.  Negative for nausea and vomiting.  Musculoskeletal:  Negative for myalgias.  Neurological:  Positive for dizziness. Negative for focal weakness, seizures and headaches.  Psychiatric/Behavioral: Negative.  Negative for suicidal ideas.     Past Medical History:  Diagnosis Date   Diabetes mellitus without complication (HCC)    Hypertension     Past Surgical History:  Procedure Laterality Date   FRACTURE SURGERY     HAND SURGERY Left    tuabl ligation     TUBAL LIGATION      Family History  Problem Relation Age of Onset   Kidney failure Mother    Heart disease Mother    Liver disease Father    Stomach cancer Maternal Uncle 59   Colon cancer Neg Hx    Colon polyps Neg Hx    Esophageal cancer Neg Hx    Rectal cancer Neg Hx     Social History Reviewed with no changes to be made today.   Outpatient Medications Prior to Visit  Medication Sig Dispense Refill   Accu-Chek Softclix Lancets lancets Use as instructed to check blood sugar twice a day 100 each 12  acetaminophen  (TYLENOL ) 500 MG tablet Take 1 tablet (500 mg total) by mouth every 8 (eight) hours as needed. 60 tablet 1   amLODipine  (NORVASC ) 10 MG tablet Take 1 tablet (10 mg total) by mouth daily. 90 tablet 1   Blood Glucose Monitoring Suppl (ACCU-CHEK GUIDE) w/Device KIT Use to check blood sugar twice a day. 1 kit 0   Blood Pressure Monitoring (BLOOD PRESSURE KIT) DEVI Use to check blood pressure once  daily. 1 each 0   celecoxib  (CELEBREX ) 200 MG capsule Take 1 capsule (200 mg total) by mouth 2 (two) times daily. 60 capsule 3   Continuous Glucose Receiver (FREESTYLE LIBRE 3 READER) DEVI Use as directed. 1 each 0   Continuous Glucose Sensor (FREESTYLE LIBRE 3 PLUS SENSOR) MISC Change sensor every 15 days. 2 each 12   diclofenac  (VOLTAREN ) 75 MG EC tablet Take 1 tablet (75 mg total) by mouth 2 (two) times daily. 60 tablet 3   Dulaglutide  (TRULICITY ) 0.75 MG/0.5ML SOAJ Inject 0.75 mg into the skin once a week. 2 mL 1   gabapentin  (NEURONTIN ) 300 MG capsule Take 2 capsules (600 mg total) by mouth at bedtime. 180 capsule 1   glucose blood (ACCU-CHEK GUIDE) test strip Use as instructed to check blood sugar twice a day 100 each 12   ibuprofen  (ADVIL ) 800 MG tablet Take 1 tablet (800 mg total) by mouth 3 (three) times daily. 90 tablet 3   insulin  glargine (LANTUS  SOLOSTAR) 100 UNIT/ML Solostar Pen Inject 10 Units into the skin at bedtime. 15 mL 1   Insulin  Pen Needle (PEN NEEDLES) 31G X 8 MM MISC Use as directed with insulin . 100 each 6   losartan  (COZAAR ) 50 MG tablet Take 1 tablet (50 mg total) by mouth daily. 90 tablet 1   metFORMIN  (GLUCOPHAGE -XR) 500 MG 24 hr tablet Take 1 tablet (500 mg total) by mouth 2 (two) times daily with a meal. 180 tablet 1   nicotine  (NICODERM CQ  - DOSED IN MG/24 HR) 7 mg/24hr patch Place 1 patch (7 mg total) onto the skin daily. 28 patch 0   No facility-administered medications prior to visit.    Allergies  Allergen Reactions   Oatmeal Shortness Of Breath and Other (See Comments)    Tongue swells   Rice Shortness Of Breath, Swelling and Other (See Comments)    Tongue swells   Wheat Shortness Of Breath, Swelling and Other (See Comments)    Tongue swells   Farxiga  [Dapagliflozin ] Other (See Comments)    Vaginal yeast infection   Lisinopril  Cough       Objective:    BP 127/74 (BP Location: Left Arm, Patient Position: Sitting, Cuff Size: Normal)   Pulse 80    Resp 19   Ht 5' 2 (1.575 m)   Wt 166 lb 3.2 oz (75.4 kg)   LMP 01/14/2015   SpO2 97%   BMI 30.40 kg/m  Wt Readings from Last 3 Encounters:  06/18/24 166 lb 3.2 oz (75.4 kg)  01/17/24 161 lb (73 kg)  01/05/24 159 lb (72.1 kg)    Physical Exam Vitals and nursing note reviewed.  Constitutional:      Appearance: She is well-developed.  HENT:     Head: Normocephalic and atraumatic.     Nose:     Right Turbinates: Enlarged and swollen (erythematous).     Left Turbinates: Enlarged, swollen and pale.  Cardiovascular:     Rate and Rhythm: Normal rate and regular rhythm.     Heart sounds: Normal  heart sounds. No murmur heard.    No friction rub. No gallop.  Pulmonary:     Effort: Pulmonary effort is normal. No tachypnea or respiratory distress.     Breath sounds: Normal breath sounds. No decreased breath sounds, wheezing, rhonchi or rales.  Chest:     Chest wall: No tenderness.  Musculoskeletal:        General: Normal range of motion.     Cervical back: Normal range of motion.  Skin:    General: Skin is warm and dry.  Neurological:     Mental Status: She is alert and oriented to person, place, and time.     Coordination: Coordination normal.  Psychiatric:        Behavior: Behavior normal. Behavior is cooperative.        Thought Content: Thought content normal.        Judgment: Judgment normal.          Patient has been counseled extensively about nutrition and exercise as well as the importance of adherence with medications and regular follow-up. The patient was given clear instructions to go to ER or return to medical center if symptoms don't improve, worsen or new problems develop. The patient verbalized understanding.   Follow-up: Return if symptoms worsen or fail to improve.   Haze LELON Servant, FNP-BC [facility] [place], [place] [phone]   06/18/2024, 2:52 PM

## 2024-06-19 ENCOUNTER — Other Ambulatory Visit: Payer: Self-pay

## 2024-06-27 ENCOUNTER — Telehealth: Payer: Self-pay | Admitting: Internal Medicine

## 2024-06-27 ENCOUNTER — Ambulatory Visit: Payer: Self-pay | Attending: Podiatry | Admitting: Physical Therapy

## 2024-06-27 DIAGNOSIS — G8929 Other chronic pain: Secondary | ICD-10-CM | POA: Insufficient documentation

## 2024-06-27 DIAGNOSIS — M25511 Pain in right shoulder: Secondary | ICD-10-CM | POA: Insufficient documentation

## 2024-06-27 DIAGNOSIS — M25512 Pain in left shoulder: Secondary | ICD-10-CM | POA: Insufficient documentation

## 2024-06-27 DIAGNOSIS — M6281 Muscle weakness (generalized): Secondary | ICD-10-CM | POA: Insufficient documentation

## 2024-06-27 DIAGNOSIS — M25611 Stiffness of right shoulder, not elsewhere classified: Secondary | ICD-10-CM | POA: Insufficient documentation

## 2024-06-27 NOTE — Therapy (Deleted)
 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  REFERRING DIAG: Achilles tendinitis of left lower extremity [M76.62], Plantar fasciitis of left foot [M72.2]   Rationale for Evaluation and Treatment:  Rehabilitation  SUBJECTIVE:  PERTINENT PAST HISTORY:  Smoker        PRECAUTIONS: None  WEIGHT BEARING RESTRICTIONS No  FALLS:  Has patient fallen in last 6 months? {yes/no:20286}, Number of falls: ***  MOI/History of condition:  Onset date: ~6 months  SUBJECTIVE STATEMENT  Pt is a 58 y.o. female who presents to clinic with chief complaint of L posterior heel pain which started about 6 months ago when stepping off a curb.  Originally thought to be rupture but MRI show tiny interstitial  tear at insertion as well as PF.  ***   Red flags:  denies   Pain:  Are you having pain? {yes/no:20286} Pain location: *** NPRS scale:  Best: {NUMBERS; 0-10:5044}/10, Worst: {NUMBERS; 0-10:5044}/10 Aggravating factors: *** Relieving factors: *** Pain description: {PAIN DESCRIPTION:21022940}  Occupation: ***  Assistive Device: ***  Hand Dominance: ***  Patient Goals/Specific Activities: ***   OBJECTIVE:   DIAGNOSTIC FINDINGS:   IMPRESSION: 1. Moderate distal Achilles tendinosis with a tiny interstitial tear at the insertion and mild subcortical marrow edema. 2. Moderate plantar fasciitis of the medial band of the plantar fascia.  GENERAL OBSERVATION/GAIT:  ***  SENSATION: Light touch: {intact/deficits:24005}  PALPATION: ***  LE MMT:  MMT Right (Eval) Left (Eval)  Hip flexion (L2, L3) *** ***  Knee extension (L3) *** ***  Knee flexion    Hip abduction *** ***  Hip extension *** ***  Hip external rotation    Hip internal rotation    Hip adduction    Ankle dorsiflexion (L4) *** ***  Ankle plantarflexion (S1) *** ***  Ankle inversion *** ***  Ankle eversion *** ***  Great Toe ext (L5)    Grossly     (Blank rows = not tested, score listed is out of 5 possible points OR may be listed in lbs of force.  N = WNL, D = diminished, C = clear for gross weakness with myotome testing, * = concordant pain with testing)  LE ROM:  ROM Right (Eval) Left (Eval)  Hip flexion    Hip  extension    Hip abduction    Hip adduction    Hip internal rotation    Hip external rotation    Knee extension    Knee flexion    Ankle dorsiflexion *** ***  Ankle plantarflexion *** ***  Ankle inversion *** ***  Ankle eversion *** ***   (Blank rows = not tested, N = WNL, * = concordant pain with testing)  Functional Tests  Eval    Progressive balance screen (highest level completed for >/= 10''):  Feet together: ***'' Semi Tandem: R in rear ***'', L in rear  ***'' Tandem: R in rear ***'', L in rear ***'' SLS: R ***'', L ***''                                                           SPECIAL TESTS:  ***   PATIENT SURVEYS:  LEFS: ***  TODAY'S TREATMENT:  Therapeutic Exercise: Creating, reviewing, and completing below HEP   PATIENT EDUCATION ([place]/HM):  POC, diagnosis, prognosis, HEP, and outcome measures.  Pt educated via explanation, demonstration, and handout (HEP).  Pt confirms understanding verbally.   HOME EXERCISE PROGRAM: ***  Treatment priorities   Eval                                                  ASSESSMENT:  CLINICAL IMPRESSION: Tamara Barnes is a 58 y.o. female who presents to clinic with signs and sxs consistent with ***.   ***.   Tamara Barnes will benefit from skilled PT to address relevant deficits and improve ***.   OBJECTIVE IMPAIRMENTS: Pain, ***  ACTIVITY LIMITATIONS: ***  PERSONAL FACTORS: See medical history and pertinent history   REHAB POTENTIAL: Good  CLINICAL DECISION MAKING: Evolving/moderate complexity  EVALUATION COMPLEXITY: Moderate   GOALS:   SHORT TERM GOALS: Target date: 07/25/2024   Tamara Barnes will be >75% HEP compliant to improve carryover between sessions and facilitate independent management of condition  Evaluation: ongoing Goal status: INITIAL   LONG TERM GOALS: Target date: 08/22/2024   Tamara Barnes will self report >/= 50% decrease in pain from evaluation to improve function in daily tasks  Evaluation/Baseline: ***/10 max pain Goal status: INITIAL   2.  Tamara Barnes will show a >/= *** pt improvement in LEFS score (MCID is ~11% or 9 pts) as a proxy for functional improvement   Evaluation/Baseline: *** pts Goal status: INITIAL   3.  Tamara Barnes will be able to ***, not limited by pain  Evaluation/Baseline: limited Goal status: INITIAL   4.  ***   5.  ***   6.  ***   PLAN: PT FREQUENCY: 1-2x/week  PT DURATION: 8 weeks  PLANNED  INTERVENTIONS:  97164- PT Re-evaluation, 97110-Therapeutic exercises, 97530- Therapeutic activity, [id number]- Neuromuscular re-education, 97535- Self Care, 02859- Manual therapy, [id number]- Gait training, [id number]- Aquatic Therapy, [phone]- Electrical stimulation (manual), [id number]- Vasopneumatic device, [id number]- Traction (mechanical), [id number]- Ionotophoresis 4mg /ml Dexamethasone , Taping, Dry Needling, Joint manipulation, and Spinal manipulation.   Tamara Barnes PT, DPT 06/27/2024, 7:33 AM  I just finished a MCD eval/recert.  Name: Tamara Barnes  MRN: [id number] {kermcd1:25763}  Check all conditions that are expected to impact treatment: {Conditions expected to impact treatment:28273}  I {kermcd2:32642} put a charge in.  Check all possible CPT codes: 02889- Therapeutic Exercise, [phone]- Neuro Re-education, [phone] - Gait Training, [phone] - Manual Therapy, 97530 - Therapeutic Activities, 97535 - Self Care, [id number] - Re-evaluation, [id number] - Mechanical traction, and [id number] - Aquatic therapy   Thank you!  MCD - Secure

## 2024-06-27 NOTE — Telephone Encounter (Signed)
 LVM to confirm appt

## 2024-06-28 ENCOUNTER — Ambulatory Visit: Admitting: Internal Medicine

## 2024-07-06 ENCOUNTER — Other Ambulatory Visit: Payer: Self-pay

## 2024-07-06 ENCOUNTER — Encounter: Payer: Self-pay | Admitting: Physical Therapy

## 2024-07-06 ENCOUNTER — Ambulatory Visit: Admitting: Physical Therapy

## 2024-07-06 DIAGNOSIS — G8929 Other chronic pain: Secondary | ICD-10-CM

## 2024-07-06 DIAGNOSIS — M25611 Stiffness of right shoulder, not elsewhere classified: Secondary | ICD-10-CM | POA: Diagnosis not present

## 2024-07-06 DIAGNOSIS — M25511 Pain in right shoulder: Secondary | ICD-10-CM | POA: Diagnosis not present

## 2024-07-06 DIAGNOSIS — M6281 Muscle weakness (generalized): Secondary | ICD-10-CM

## 2024-07-06 DIAGNOSIS — M25512 Pain in left shoulder: Secondary | ICD-10-CM | POA: Diagnosis not present

## 2024-07-06 NOTE — Therapy (Signed)
 OUTPATIENT PHYSICAL THERAPY LOWER EXTREMITY EVALUATION  Patient Name: Tamara Barnes MRN: [id number] DOB:04/11/1966, 58 y.o., female Today's Date: 07/06/2024   PT End of Session - 07/06/24 1035     Visit Number 1    Number of Visits --   1-2x/week   Date for Recertification  08/31/24    Authorization Type [employer]    PT Start Time 0805    PT Stop Time 0845    PT Time Calculation (min) 40 min          Past Medical History:  Diagnosis Date   Diabetes mellitus without complication (HCC)    Hypertension    Past Surgical History:  Procedure Laterality Date   FRACTURE SURGERY     HAND SURGERY Left    tuabl ligation     TUBAL LIGATION     Patient Active Problem List   Diagnosis Date Noted   Mass of joint of left hand 03/08/2023   Pain of left hand 01/13/2023   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) 08/14/2020   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) 08/14/2020   Nuclear sclerotic cataract of both eyes 08/14/2020   Vitreomacular adhesion of both eyes 08/14/2020   Abdominal pain    E coli bacteremia    Pyelonephritis 04/07/2020   Sinusitis 07/04/2012   Elevated BP 07/04/2012   Rhinitis medicamentosa 07/04/2012   FIBROIDS, UTERUS 02/24/2009   UNSPECIFIED ABNORMAL MAMMOGRAM 02/24/2009   DYSFUNCTIONAL UTERINE BLEEDING 08/02/2008   ANEMIA 06/04/2008   SCIATICA 06/04/2008   TOBACCO DEPENDENCE 11/10/2006    PCP: Vicci Barnie NOVAK, MD  REFERRING PROVIDER: Verta Royden DASEN, DPM  THERAPY DIAG:  Chronic pain of both shoulders - Plan: PT plan of care cert/re-cert  Muscle weakness (generalized) - Plan: PT plan of care cert/re-cert  Stiffness of right shoulder, not elsewhere classified - Plan: PT plan of care cert/re-cert  REFERRING DIAG: Achilles tendinitis of left lower extremity [M76.62], Plantar fasciitis of left foot [M72.2]   Rationale for Evaluation and Treatment:  Rehabilitation  SUBJECTIVE:  PERTINENT PAST  HISTORY:  Smoker, diabetic neuropathy      PRECAUTIONS: None  WEIGHT BEARING RESTRICTIONS No  FALLS:  Has patient fallen in last 6 months? No, Number of falls: 0  MOI/History of condition:  Onset date: ~6 months  SUBJECTIVE STATEMENT  Pt is a 58 y.o. female who presents to clinic with chief complaint of L posterior heel pain which started about 6 months ago when stepping off a curb.  Originally thought to be rupture but MRI show tiny interstitial tear at insertion as well as PF.  Pt states that she had a few stiches and has been in a boot since march (sounds superficial).  States that podiatrist told her to try PT for 3 weeks.  Pt reports the pain can come with no load almost randomly.  Walking for longer distances is even worse.   Red flags:  denies   Pain:  Are you having pain? Yes Pain location: L distal achilles NPRS scale:  Best: 2/10, Worst: 10/10 Aggravating factors: any load and randomly Relieving factors: minimal Pain description: sharp and throbbing  Occupation: NA  Assistive Device: NA  Hand Dominance: NA  Patient Goals/Specific Activities: reduce pain and improve function   OBJECTIVE:   DIAGNOSTIC FINDINGS:   IMPRESSION: 1. Moderate distal Achilles tendinosis with a tiny interstitial tear at the insertion and mild subcortical marrow edema. 2. Moderate plantar fasciitis of the medial band of the plantar fascia.  GENERAL OBSERVATION/GAIT:  Antalgic gait, reduced time in stance R, in CAM boot  SENSATION: Light touch: Appears intact  PALPATION: TTP L achilles tendon  LE ROM:  ROM Right (Eval) Left (Eval)  Hip flexion (L2, L3)    Knee extension (L3)    Knee flexion    Hip abduction    Hip extension    Hip external rotation    Hip internal rotation    Hip adduction    Ankle dorsiflexion (L4) 7 Lacking 15 **  Ankle plantarflexion (S1)    Ankle inversion 30 30*  Ankle eversion 30 10*  Great Toe ext (L5)    Grossly     (Blank rows =  not tested, score listed is out of 5 possible points OR may be listed in lbs of force.  N = WNL, D = diminished, C = clear for gross weakness with myotome testing, * = concordant pain with testing)  LE MMT:  MMT Right (Eval) Left (Eval)  Hip flexion    Hip extension    Hip abduction    Hip adduction    Hip internal rotation    Hip external rotation    Knee extension    Knee flexion    Ankle dorsiflexion n 3+*  Ankle plantarflexion n 3*  Ankle inversion n 3+*  Ankle eversion n 3+*   (Blank rows = not tested, N = WNL, * = concordant pain with testing)  Functional Tests  Eval    Unable to FWB L foot d/t pain                                                          SPECIAL TESTS:  NA   PATIENT SURVEYS:  LEFS: 31/80  TODAY'S TREATMENT:  Therapeutic Exercise: Creating, reviewing, and completing below HEP   PATIENT EDUCATION ([place]/HM):  POC, diagnosis, prognosis, HEP, and outcome measures.  Pt educated via explanation, demonstration, and handout (HEP).  Pt confirms understanding verbally.   HOME EXERCISE PROGRAM: Access Code: [email or web address] URL: [email or web address] Date: 07/06/2024 Prepared by: Helene Gasmen  Exercises - Seated Toe Towel Scrunches  - 1 x daily - 7 x weekly - 3 sets - 10 reps - Ankle Inversion Eversion Towel Slide  - 1 x daily - 7 x weekly - 3 sets - 10 reps - Seated Heel Toe Raises  - 1 x daily - 7 x weekly - 3 sets - 10 reps - Seated Ankle Dorsiflexion Stretch  - 1 x daily - 7 x weekly - 3 sets - 10 reps - Seated Heel Raise  - 1 x daily - 7 x weekly - 3 sets - 10 reps  Treatment priorities   Eval        DF and eversion ROM        Progressive loading        Gait and w/b                          ASSESSMENT:  CLINICAL IMPRESSION: Tamara Barnes is a 57 y.o. female who presents to clinic with signs and sxs consistent with L severe achilles pain which started about 6 months ago.  Has been in booth the entire time per her  report.  Significant pain and tenderness to light touch and w/b.  Started with light ROM and strengthening as she is significantly lacking DF ROM.   Tamara Barnes will benefit from skilled PT to address relevant deficits and improve comfort and ability to complete daily tasks.   OBJECTIVE IMPAIRMENTS: Pain, ankle ROM, ankle strength, gait, balance  ACTIVITY LIMITATIONS: standing, walking, sitting, sleeping, shopping, housework, self care  PERSONAL FACTORS: See medical history and pertinent history   REHAB POTENTIAL: Good  CLINICAL DECISION MAKING: Evolving/moderate complexity  EVALUATION COMPLEXITY: Moderate   GOALS:   SHORT TERM GOALS: Target date: 07/25/2024   Taleigh will be >75% HEP compliant to improve carryover between sessions and facilitate independent management of condition  Evaluation: ongoing Goal status: INITIAL   LONG TERM GOALS: Target date: 08/22/2024   Timberlynn will self report >/= 50% decrease in pain from evaluation to improve function in daily tasks  Evaluation/Baseline: 10/10 max pain Goal status: INITIAL   2.  Anahid will show a >/= 20 pt improvement in LEFS score (MCID is ~11% or 9 pts) as a proxy for functional improvement   Evaluation/Baseline: 31 pts Goal status: INITIAL   3.  Charlestine will be able to walk to go grocery shopping, not limited by pain  Evaluation/Baseline: limited Goal status: INITIAL   4.  Christmas will improve left  ankle DF to 7 degrees   Evaluation/Baseline: lacking 15 degrees Goal status: INITIAL   5.  Devonia will be able to perform bil heel raise without significant w/s, not limited by pain  Evaluation/Baseline: limited Goal status: INITIAL    PLAN: PT FREQUENCY: 1-2x/week  PT DURATION: 8 weeks  PLANNED INTERVENTIONS:  97164- PT Re-evaluation, 97110-Therapeutic exercises, 97530- Therapeutic activity, [id number]- Neuromuscular re-education, 97535- Self Care, 02859- Manual therapy, [id number]- Gait training,  [id number]- Aquatic Therapy, [phone]- Electrical stimulation (manual), [id number]- Vasopneumatic device, [id number]- Traction (mechanical), [id number]- Ionotophoresis 4mg /ml Dexamethasone , Taping, Dry Needling, Joint manipulation, and Spinal manipulation.   Mckayla Mulcahey PT, DPT 07/06/2024, 10:44 AM

## 2024-07-12 ENCOUNTER — Ambulatory Visit: Payer: Self-pay | Admitting: Physical Therapy

## 2024-07-16 ENCOUNTER — Encounter: Payer: Self-pay | Admitting: Radiology

## 2024-07-16 ENCOUNTER — Other Ambulatory Visit: Payer: Self-pay

## 2024-07-17 ENCOUNTER — Other Ambulatory Visit: Payer: Self-pay

## 2024-07-17 ENCOUNTER — Encounter: Payer: Self-pay | Admitting: Physical Therapy

## 2024-07-17 ENCOUNTER — Ambulatory Visit: Attending: Sports Medicine | Admitting: Physical Therapy

## 2024-07-17 ENCOUNTER — Other Ambulatory Visit: Payer: Self-pay | Admitting: Internal Medicine

## 2024-07-17 DIAGNOSIS — M25512 Pain in left shoulder: Secondary | ICD-10-CM | POA: Insufficient documentation

## 2024-07-17 DIAGNOSIS — G8929 Other chronic pain: Secondary | ICD-10-CM | POA: Diagnosis not present

## 2024-07-17 DIAGNOSIS — M25511 Pain in right shoulder: Secondary | ICD-10-CM | POA: Diagnosis not present

## 2024-07-17 DIAGNOSIS — M25611 Stiffness of right shoulder, not elsewhere classified: Secondary | ICD-10-CM | POA: Diagnosis not present

## 2024-07-17 DIAGNOSIS — M6281 Muscle weakness (generalized): Secondary | ICD-10-CM | POA: Insufficient documentation

## 2024-07-17 MED ORDER — TRULICITY 0.75 MG/0.5ML ~~LOC~~ SOAJ
0.7500 mg | SUBCUTANEOUS | 1 refills | Status: DC
  Filled 2024-07-17: qty 2, 28d supply, fill #0
  Filled 2024-08-13: qty 2, 28d supply, fill #1

## 2024-07-17 NOTE — Therapy (Signed)
 OUTPATIENT PHYSICAL THERAPY DAILY NOTE  Patient Name: Tamara Barnes MRN: [id number] DOB:[birth year], 58 y.o., female Today's Date: 07/17/2024   PT End of Session - 07/17/24 1449     Visit Number 2    Number of Visits 8   1-2x/week   Date for Recertification  08/31/24    Authorization Type [employer]    Authorization Time Period approved 8 PT visits from 8 PT visits from 07/06/24-09/04/24    Authorization - Visit Number 2    Authorization - Number of Visits 8    PT Start Time 1445    PT Stop Time 1527    PT Time Calculation (min) 42 min          Past Medical History:  Diagnosis Date   Diabetes mellitus without complication (HCC)    Hypertension    Past Surgical History:  Procedure Laterality Date   FRACTURE SURGERY     HAND SURGERY Left    tuabl ligation     TUBAL LIGATION     Patient Active Problem List   Diagnosis Date Noted   Mass of joint of left hand 03/08/2023   Pain of left hand 01/13/2023   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) 08/14/2020   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) 08/14/2020   Nuclear sclerotic cataract of both eyes 08/14/2020   Vitreomacular adhesion of both eyes 08/14/2020   Abdominal pain    E coli bacteremia    Pyelonephritis 04/07/2020   Sinusitis 07/04/2012   Elevated BP 07/04/2012   Rhinitis medicamentosa 07/04/2012   FIBROIDS, UTERUS 02/24/2009   UNSPECIFIED ABNORMAL MAMMOGRAM 02/24/2009   DYSFUNCTIONAL UTERINE BLEEDING 08/02/2008   ANEMIA 06/04/2008   SCIATICA 06/04/2008   TOBACCO DEPENDENCE 11/10/2006    PCP: Vicci Barnie NOVAK, MD  REFERRING PROVIDER: Vicci Barnie NOVAK, MD  THERAPY DIAG:  Chronic pain of both shoulders  Muscle weakness (generalized)  Stiffness of right shoulder, not elsewhere classified  REFERRING DIAG: Achilles tendinitis of left lower extremity [M76.62], Plantar fasciitis of left foot [M72.2]   Rationale for Evaluation and Treatment:   Rehabilitation  SUBJECTIVE:  PERTINENT PAST HISTORY:  Smoker, diabetic neuropathy      PRECAUTIONS: None  WEIGHT BEARING RESTRICTIONS No  FALLS:  Has patient fallen in last 6 months? No, Number of falls: 0  MOI/History of condition:  Onset date: ~6 months  SUBJECTIVE STATEMENT  07/17/2024:  Pt reports continued pain but that today it is hurting even more than normal.  She has been wearing slides at home with a larger heel which is relieving.    EVAL:  Pt is a 58 y.o. female who presents to clinic with chief complaint of L posterior heel pain which started about 6 months ago when stepping off a curb.  Originally thought to be rupture but MRI show tiny interstitial tear at insertion as well as PF.  Pt states that she had a few stiches and has been in a boot since march (sounds superficial).  States that podiatrist told her to try PT for 3 weeks.  Pt reports the pain can come with no load almost randomly.  Walking for longer distances is even worse.   Red flags:  denies   Pain:  Are you having pain? Yes Pain location: L distal achilles NPRS scale:  Best: 2/10, Worst: 10/10 Aggravating factors: any load and randomly Relieving factors: minimal Pain description: sharp and throbbing  Occupation: NA  Assistive Device: NA  Hand Dominance: NA  Patient Goals/Specific  Activities: reduce pain and improve function   OBJECTIVE:   DIAGNOSTIC FINDINGS:   IMPRESSION: 1. Moderate distal Achilles tendinosis with a tiny interstitial tear at the insertion and mild subcortical marrow edema. 2. Moderate plantar fasciitis of the medial band of the plantar fascia.  GENERAL OBSERVATION/GAIT:  Antalgic gait, reduced time in stance R, in CAM boot  SENSATION: Light touch: Appears intact  PALPATION: TTP L achilles tendon  LE ROM:  ROM Right (Eval) Left (Eval)  Hip flexion (L2, L3)    Knee extension (L3)    Knee flexion    Hip abduction    Hip extension    Hip external  rotation    Hip internal rotation    Hip adduction    Ankle dorsiflexion (L4) 7 Lacking 15 **  Ankle plantarflexion (S1)    Ankle inversion 30 30*  Ankle eversion 30 10*  Great Toe ext (L5)    Grossly     (Blank rows = not tested, score listed is out of 5 possible points OR may be listed in lbs of force.  N = WNL, D = diminished, C = clear for gross weakness with myotome testing, * = concordant pain with testing)  LE MMT:  MMT Right (Eval) Left (Eval)  Hip flexion    Hip extension    Hip abduction    Hip adduction    Hip internal rotation    Hip external rotation    Knee extension    Knee flexion    Ankle dorsiflexion n 3+*  Ankle plantarflexion n 3*  Ankle inversion n 3+*  Ankle eversion n 3+*   (Blank rows = not tested, N = WNL, * = concordant pain with testing)  Functional Tests  Eval    Unable to FWB L foot d/t pain                                                          SPECIAL TESTS:  NA   PATIENT SURVEYS:  LEFS: 31/80    HOME EXERCISE PROGRAM: Access Code: [email or web address] URL: [email or web address] Date: 07/17/2024 Prepared by: Helene Gasmen  Exercises - Seated Toe Towel Scrunches  - 1 x daily - 7 x weekly - 3 sets - 10 reps - Ankle Inversion Eversion Towel Slide  - 1 x daily - 7 x weekly - 3 sets - 10 reps - Ankle and Toe Plantarflexion with Resistance  - 2 x daily - 7 x weekly - 2 sets - 6 reps - 10 sec hold  Treatment priorities   Eval        DF and eversion ROM        Progressive loading        Gait and w/b                         TODAY'S TREATMENT:  [facility] Adult PT Treatment  07/17/2024:  Therapeutic Exercise: nu-step L5 6m while taking subjective and planning session with patient Towels scrunches [place] pick ups Heel raise 10# - 2x6x6s PF with blue TB - 2x6x6s  Manual Therapy  STM L GS complex and achilles     ASSESSMENT:  CLINICAL IMPRESSION: 07/17/2024: Teah tolerated session well with no  adverse reaction.  Reports increased pain today and generally feels boot is  unhelpful.  Increased load today with pt reporting mild transient pain.  Updated HEP.  EVAL: Kirbie is a 58 y.o. female who presents to clinic with signs and sxs consistent with L severe achilles pain which started about 6 months ago.  Has been in booth the entire time per her report.  Significant pain and tenderness to light touch and w/b.  Started with light ROM and strengthening as she is significantly lacking DF ROM.   Chole will benefit from skilled PT to address relevant deficits and improve comfort and ability to complete daily tasks.   OBJECTIVE IMPAIRMENTS: Pain, ankle ROM, ankle strength, gait, balance  ACTIVITY LIMITATIONS: standing, walking, sitting, sleeping, shopping, housework, self care  PERSONAL FACTORS: See medical history and pertinent history   REHAB POTENTIAL: Good  CLINICAL DECISION MAKING: Evolving/moderate complexity  EVALUATION COMPLEXITY: Moderate   GOALS:   SHORT TERM GOALS: Target date: 07/25/2024   Autymn will be >75% HEP compliant to improve carryover between sessions and facilitate independent management of condition  Evaluation: ongoing Goal status: INITIAL   LONG TERM GOALS: Target date: 08/22/2024   Tessla will self report >/= 50% decrease in pain from evaluation to improve function in daily tasks  Evaluation/Baseline: 10/10 max pain Goal status: INITIAL   2.  Llana will show a >/= 20 pt improvement in LEFS score (MCID is ~11% or 9 pts) as a proxy for functional improvement   Evaluation/Baseline: 31 pts Goal status: INITIAL   3.  Desirey will be able to walk to go grocery shopping, not limited by pain  Evaluation/Baseline: limited Goal status: INITIAL   4.  Jalayne will improve left  ankle DF to 7 degrees   Evaluation/Baseline: lacking 15 degrees Goal status: INITIAL   5.  Hannah will be able to perform bil heel raise without  significant w/s, not limited by pain  Evaluation/Baseline: limited Goal status: INITIAL    PLAN: PT FREQUENCY: 1-2x/week  PT DURATION: 8 weeks  PLANNED INTERVENTIONS:  97164- PT Re-evaluation, 97110-Therapeutic exercises, 97530- Therapeutic activity, [id number]- Neuromuscular re-education, 97535- Self Care, 02859- Manual therapy, [id number]- Gait training, [id number]- Aquatic Therapy, [phone]- Electrical stimulation (manual), [id number]- Vasopneumatic device, [id number]- Traction (mechanical), [id number]- Ionotophoresis 4mg /ml Dexamethasone , Taping, Dry Needling, Joint manipulation, and Spinal manipulation.   Kellyn Mccary PT, DPT 07/17/2024, 3:38 PM

## 2024-07-18 ENCOUNTER — Other Ambulatory Visit: Payer: Self-pay

## 2024-07-25 DIAGNOSIS — Z419 Encounter for procedure for purposes other than remedying health state, unspecified: Secondary | ICD-10-CM | POA: Diagnosis not present

## 2024-07-27 ENCOUNTER — Other Ambulatory Visit: Payer: Self-pay

## 2024-08-06 ENCOUNTER — Ambulatory Visit: Admitting: Internal Medicine

## 2024-08-06 ENCOUNTER — Telehealth: Payer: Self-pay

## 2024-08-06 NOTE — Telephone Encounter (Signed)
 Copied from CRM #[id number]. Topic: Appointments - Appointment Cancel/Reschedule >> Aug 06, 2024  8:23 AM Cleave MATSU wrote: Patient/patient representative is calling to cancel or reschedule an appointment. Refer to attachments for appointment information. Pt wants to reschedule appt she has for today

## 2024-08-07 ENCOUNTER — Ambulatory Visit: Admitting: Physical Therapy

## 2024-08-07 ENCOUNTER — Encounter: Payer: Self-pay | Admitting: Physical Therapy

## 2024-08-07 DIAGNOSIS — M25511 Pain in right shoulder: Secondary | ICD-10-CM | POA: Diagnosis not present

## 2024-08-07 DIAGNOSIS — M25611 Stiffness of right shoulder, not elsewhere classified: Secondary | ICD-10-CM | POA: Diagnosis not present

## 2024-08-07 DIAGNOSIS — M25512 Pain in left shoulder: Secondary | ICD-10-CM | POA: Diagnosis not present

## 2024-08-07 DIAGNOSIS — G8929 Other chronic pain: Secondary | ICD-10-CM

## 2024-08-07 DIAGNOSIS — M6281 Muscle weakness (generalized): Secondary | ICD-10-CM

## 2024-08-07 NOTE — Therapy (Signed)
 OUTPATIENT PHYSICAL THERAPY DAILY NOTE  Patient Name: Tamara Barnes MRN: [id number] DOB:[birth year], 58 y.o., female Today's Date: 08/07/2024   PT End of Session - 08/07/24 0802     Visit Number 3    Number of Visits 8   1-2x/week   Date for Recertification  08/31/24    Authorization Type [employer]    Authorization Time Period approved 8 PT visits from 8 PT visits from 07/06/24-09/04/24    Authorization - Visit Number 3    Authorization - Number of Visits 8    PT Start Time 0801    PT Stop Time 0842    PT Time Calculation (min) 41 min          Past Medical History:  Diagnosis Date   Diabetes mellitus without complication (HCC)    Hypertension    Past Surgical History:  Procedure Laterality Date   FRACTURE SURGERY     HAND SURGERY Left    tuabl ligation     TUBAL LIGATION     Patient Active Problem List   Diagnosis Date Noted   Mass of joint of left hand 03/08/2023   Pain of left hand 01/13/2023   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) 08/14/2020   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) 08/14/2020   Nuclear sclerotic cataract of both eyes 08/14/2020   Vitreomacular adhesion of both eyes 08/14/2020   Abdominal pain    E coli bacteremia    Pyelonephritis 04/07/2020   Sinusitis 07/04/2012   Elevated BP 07/04/2012   Rhinitis medicamentosa 07/04/2012   FIBROIDS, UTERUS 02/24/2009   UNSPECIFIED ABNORMAL MAMMOGRAM 02/24/2009   DYSFUNCTIONAL UTERINE BLEEDING 08/02/2008   ANEMIA 06/04/2008   SCIATICA 06/04/2008   TOBACCO DEPENDENCE 11/10/2006    PCP: Vicci Barnie NOVAK, MD  REFERRING PROVIDER: Verta Royden DASEN, DPM  THERAPY DIAG:  Chronic pain of both shoulders  Muscle weakness (generalized)  Stiffness of right shoulder, not elsewhere classified  REFERRING DIAG: Achilles tendinitis of left lower extremity [M76.62], Plantar fasciitis of left foot [M72.2]   Rationale for Evaluation and Treatment:   Rehabilitation  SUBJECTIVE:  PERTINENT PAST HISTORY:  Smoker, diabetic neuropathy      PRECAUTIONS: None  WEIGHT BEARING RESTRICTIONS No  FALLS:  Has patient fallen in last 6 months? No, Number of falls: 0  MOI/History of condition:  Onset date: ~6 months  SUBJECTIVE STATEMENT  08/07/2024:  Pt reports she was stuck on a train for 18 hours and her whole body hurts.    EVAL:  Pt is a 58 y.o. female who presents to clinic with chief complaint of L posterior heel pain which started about 6 months ago when stepping off a curb.  Originally thought to be rupture but MRI show tiny interstitial tear at insertion as well as PF.  Pt states that she had a few stiches and has been in a boot since march (sounds superficial).  States that podiatrist told her to try PT for 3 weeks.  Pt reports the pain can come with no load almost randomly.  Walking for longer distances is even worse.   Red flags:  denies   Pain:  Are you having pain? Yes Pain location: L distal achilles NPRS scale:  Best: 2/10, Worst: 10/10 Aggravating factors: any load and randomly Relieving factors: minimal Pain description: sharp and throbbing  Occupation: NA  Assistive Device: NA  Hand Dominance: NA  Patient Goals/Specific Activities: reduce pain and improve function   OBJECTIVE:   DIAGNOSTIC FINDINGS:  IMPRESSION: 1. Moderate distal Achilles tendinosis with a tiny interstitial tear at the insertion and mild subcortical marrow edema. 2. Moderate plantar fasciitis of the medial band of the plantar fascia.  GENERAL OBSERVATION/GAIT:  Antalgic gait, reduced time in stance R, in CAM boot  SENSATION: Light touch: Appears intact  PALPATION: TTP L achilles tendon  LE ROM:  ROM Right (Eval) Left (Eval)  Hip flexion (L2, L3)    Knee extension (L3)    Knee flexion    Hip abduction    Hip extension    Hip external rotation    Hip internal rotation    Hip adduction    Ankle dorsiflexion (L4)  7 Lacking 15 **  Ankle plantarflexion (S1)    Ankle inversion 30 30*  Ankle eversion 30 10*  Great Toe ext (L5)    Grossly     (Blank rows = not tested, score listed is out of 5 possible points OR may be listed in lbs of force.  N = WNL, D = diminished, C = clear for gross weakness with myotome testing, * = concordant pain with testing)  LE MMT:  MMT Right (Eval) Left (Eval)  Hip flexion    Hip extension    Hip abduction    Hip adduction    Hip internal rotation    Hip external rotation    Knee extension    Knee flexion    Ankle dorsiflexion n 3+*  Ankle plantarflexion n 3*  Ankle inversion n 3+*  Ankle eversion n 3+*   (Blank rows = not tested, N = WNL, * = concordant pain with testing)  Functional Tests  Eval    Unable to FWB L foot d/t pain                                                          SPECIAL TESTS:  NA   PATIENT SURVEYS:  LEFS: 31/80    HOME EXERCISE PROGRAM: Access Code: [email or web address] URL: [email or web address] Date: 07/17/2024 Prepared by: Helene Gasmen  Exercises - Seated Toe Towel Scrunches  - 1 x daily - 7 x weekly - 3 sets - 10 reps - Ankle Inversion Eversion Towel Slide  - 1 x daily - 7 x weekly - 3 sets - 10 reps - Ankle and Toe Plantarflexion with Resistance  - 2 x daily - 7 x weekly - 2 sets - 6 reps - 10 sec hold  Treatment priorities   Eval        DF and eversion ROM        Progressive loading        Gait and w/b                         TODAY'S TREATMENT:  [facility] Adult PT Treatment  08/07/2024:  Therapeutic Exercise: nu-step L5 4m while taking subjective and planning session with patient Inv/eversion - 3x50 PF/DF - 3x50 DF stretch with sheet - 45'' x3 Heel raise 25# - 6x10s PF with blue TB - 2x6x6s  Manual Therapy  STM L GS complex and achilles     ASSESSMENT:  CLINICAL IMPRESSION: 08/07/2024: Tam tolerated session well with no adverse reaction.  Continuing to show progress.   Working on high volume non agging movements with low volume high  intensity loading of the achilles.  EVAL: Tamara Barnes is a 58 y.o. female who presents to clinic with signs and sxs consistent with L severe achilles pain which started about 6 months ago.  Has been in booth the entire time per her report.  Significant pain and tenderness to light touch and w/b.  Started with light ROM and strengthening as she is significantly lacking DF ROM.   Jema will benefit from skilled PT to address relevant deficits and improve comfort and ability to complete daily tasks.   OBJECTIVE IMPAIRMENTS: Pain, ankle ROM, ankle strength, gait, balance  ACTIVITY LIMITATIONS: standing, walking, sitting, sleeping, shopping, housework, self care  PERSONAL FACTORS: See medical history and pertinent history   REHAB POTENTIAL: Good  CLINICAL DECISION MAKING: Evolving/moderate complexity  EVALUATION COMPLEXITY: Moderate   GOALS:   SHORT TERM GOALS: Target date: 07/25/2024   Dura will be >75% HEP compliant to improve carryover between sessions and facilitate independent management of condition  Evaluation: ongoing Goal status: INITIAL   LONG TERM GOALS: Target date: 08/22/2024   Elvia will self report >/= 50% decrease in pain from evaluation to improve function in daily tasks  Evaluation/Baseline: 10/10 max pain Goal status: INITIAL   2.  Aelyn will show a >/= 20 pt improvement in LEFS score (MCID is ~11% or 9 pts) as a proxy for functional improvement   Evaluation/Baseline: 31 pts Goal status: INITIAL   3.  Tracia will be able to walk to go grocery shopping, not limited by pain  Evaluation/Baseline: limited Goal status: INITIAL   4.  Kalya will improve left  ankle DF to 7 degrees   Evaluation/Baseline: lacking 15 degrees Goal status: INITIAL   5.  Rasa will be able to perform bil heel raise without significant w/s, not limited by pain  Evaluation/Baseline:  limited Goal status: INITIAL    PLAN: PT FREQUENCY: 1-2x/week  PT DURATION: 8 weeks  PLANNED INTERVENTIONS:  97164- PT Re-evaluation, 97110-Therapeutic exercises, 97530- Therapeutic activity, [id number]- Neuromuscular re-education, 97535- Self Care, [id number]- Manual therapy, [id number]- Gait training, [id number]- Aquatic Therapy, [phone]- Electrical stimulation (manual), [id number]- Vasopneumatic device, [id number]- Traction (mechanical), [id number]- Ionotophoresis 4mg /ml Dexamethasone , Taping, Dry Needling, Joint manipulation, and Spinal manipulation.   Lynnae Ludemann E Tyneshia Stivers PT 08/07/2024, 8:44 AM

## 2024-08-13 ENCOUNTER — Other Ambulatory Visit: Payer: Self-pay

## 2024-08-13 ENCOUNTER — Other Ambulatory Visit: Payer: Self-pay | Admitting: Internal Medicine

## 2024-08-13 DIAGNOSIS — E1159 Type 2 diabetes mellitus with other circulatory complications: Secondary | ICD-10-CM

## 2024-08-13 MED ORDER — LOSARTAN POTASSIUM 50 MG PO TABS
50.0000 mg | ORAL_TABLET | Freq: Every day | ORAL | 1 refills | Status: AC
  Filled 2024-08-13: qty 90, 90d supply, fill #0

## 2024-08-13 NOTE — Therapy (Signed)
 OUTPATIENT PHYSICAL THERAPY DAILY NOTE  Patient Name: Theta Leaf MRN: [id number] DOB:[birth year], 58 y.o., female Today's Date: 08/14/2024   PT End of Session - 08/14/24 1103     Visit Number 4    Number of Visits 8    Date for Recertification  08/31/24    Authorization Type [employer]    Authorization Time Period approved 8 PT visits from 8 PT visits from 07/06/24-09/04/24    Authorization - Visit Number 4    Authorization - Number of Visits 8    PT Start Time 1101    PT Stop Time 1141    PT Time Calculation (min) 40 min    Activity Tolerance Patient tolerated treatment well           Past Medical History:  Diagnosis Date   Diabetes mellitus without complication (HCC)    Hypertension    Past Surgical History:  Procedure Laterality Date   FRACTURE SURGERY     HAND SURGERY Left    tuabl ligation     TUBAL LIGATION     Patient Active Problem List   Diagnosis Date Noted   Mass of joint of left hand 03/08/2023   Pain of left hand 01/13/2023   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) 08/14/2020   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) 08/14/2020   Nuclear sclerotic cataract of both eyes 08/14/2020   Vitreomacular adhesion of both eyes 08/14/2020   Abdominal pain    E coli bacteremia    Pyelonephritis 04/07/2020   Sinusitis 07/04/2012   Elevated BP 07/04/2012   Rhinitis medicamentosa 07/04/2012   FIBROIDS, UTERUS 02/24/2009   UNSPECIFIED ABNORMAL MAMMOGRAM 02/24/2009   DYSFUNCTIONAL UTERINE BLEEDING 08/02/2008   ANEMIA 06/04/2008   SCIATICA 06/04/2008   TOBACCO DEPENDENCE 11/10/2006    PCP: Vicci Barnie NOVAK, MD  REFERRING PROVIDER: Verta Royden DASEN, DPM  THERAPY DIAG:  Chronic pain of both shoulders  Muscle weakness (generalized)  Stiffness of right shoulder, not elsewhere classified  REFERRING DIAG: Achilles tendinitis of left lower extremity [M76.62], Plantar fasciitis of left foot [M72.2]    Rationale for Evaluation and Treatment:  Rehabilitation  SUBJECTIVE:  PERTINENT PAST HISTORY:  Smoker, diabetic neuropathy      PRECAUTIONS: None  WEIGHT BEARING RESTRICTIONS No  FALLS:  Has patient fallen in last 6 months? No, Number of falls: 0  MOI/History of condition:  Onset date: ~6 months  SUBJECTIVE STATEMENT  08/14/2024:  Pt reports L post heel and achilles pain is much better, but she is still having pain.  EVAL:  Pt is a 58 y.o. female who presents to clinic with chief complaint of L posterior heel pain which started about 6 months ago when stepping off a curb.  Originally thought to be rupture but MRI show tiny interstitial tear at insertion as well as PF.  Pt states that she had a few stiches and has been in a boot since march (sounds superficial).  States that podiatrist told her to try PT for 3 weeks.  Pt reports the pain can come with no load almost randomly.  Walking for longer distances is even worse.   Red flags:  denies   Pain:  Are you having pain? Yes Pain location: L distal achilles NPRS scale:  Best: 2/10, Worst: 10/10; Current 3/10 Aggravating factors: any load and randomly Relieving factors: minimal Pain description: sharp and throbbing  Occupation: NA  Assistive Device: NA  Hand Dominance: NA  Patient Goals/Specific Activities: reduce pain and  improve function   OBJECTIVE:   DIAGNOSTIC FINDINGS:   IMPRESSION: 1. Moderate distal Achilles tendinosis with a tiny interstitial tear at the insertion and mild subcortical marrow edema. 2. Moderate plantar fasciitis of the medial band of the plantar fascia.  GENERAL OBSERVATION/GAIT:  Antalgic gait, reduced time in stance R, in CAM boot  SENSATION: Light touch: Appears intact  PALPATION: TTP L achilles tendon  LE ROM:  ROM Right (Eval) Left (Eval)  Hip flexion (L2, L3)    Knee extension (L3)    Knee flexion    Hip abduction    Hip extension    Hip external rotation     Hip internal rotation    Hip adduction    Ankle dorsiflexion (L4) 7 Lacking 15 **  Ankle plantarflexion (S1)    Ankle inversion 30 30*  Ankle eversion 30 10*  Great Toe ext (L5)    Grossly     (Blank rows = not tested, score listed is out of 5 possible points OR may be listed in lbs of force.  N = WNL, D = diminished, C = clear for gross weakness with myotome testing, * = concordant pain with testing)  LE MMT:  MMT Right (Eval) Left (Eval)  Hip flexion    Hip extension    Hip abduction    Hip adduction    Hip internal rotation    Hip external rotation    Knee extension    Knee flexion    Ankle dorsiflexion n 3+*  Ankle plantarflexion n 3*  Ankle inversion n 3+*  Ankle eversion n 3+*   (Blank rows = not tested, N = WNL, * = concordant pain with testing)  Functional Tests  Eval    Unable to FWB L foot d/t pain                                                          SPECIAL TESTS:  NA   PATIENT SURVEYS:  LEFS: 31/80    HOME EXERCISE PROGRAM: Access Code: [email or web address] URL: [email or web address] Date: 07/17/2024 Prepared by: Helene Gasmen  Exercises - Seated Toe Towel Scrunches  - 1 x daily - 7 x weekly - 3 sets - 10 reps - Ankle Inversion Eversion Towel Slide  - 1 x daily - 7 x weekly - 3 sets - 10 reps - Ankle and Toe Plantarflexion with Resistance  - 2 x daily - 7 x weekly - 2 sets - 6 reps - 10 sec hold  Treatment priorities   Eval        DF and eversion ROM        Progressive loading        Gait and w/b                         TODAY'S TREATMENT: [facility] Adult PT Treatment:                                                DATE: 08/14/24 Therapeutic Exercise: nu-step L5 47m while taking subjective and planning session with patient Inv/eversion - 3x50 PF/DF - 3x50 DF stretch with sheet - 45'' x3  Heel raise 25# - 6x10s PF with blue TB - 2x6x6s Manual Therapy STM L GS complex and achilles   [facility] Adult PT Treatment   08/07/2024:  Therapeutic Exercise: nu-step L5 27m while taking subjective and planning session with patient Inv/eversion - 3x50 PF/DF - 3x50 DF stretch with sheet - 45'' x3 Heel raise 25# - 6x10s PF with blue TB - 2x6x6s  Manual Therapy  STM L GS complex and achilles     ASSESSMENT:  CLINICAL IMPRESSION: 08/14/2024: Subjective report continues to be improved. STM and therex were continued as before with pt tolerating the prescribed exs without adverse effects. Pt will continue to benefit from skilled PT to address impairments for improved L ankle/foot function with minimized pain.    EVAL: Chrishelle is a 58 y.o. female who presents to clinic with signs and sxs consistent with L severe achilles pain which started about 6 months ago.  Has been in booth the entire time per her report.  Significant pain and tenderness to light touch and w/b.  Started with light ROM and strengthening as she is significantly lacking DF ROM.   Arrie will benefit from skilled PT to address relevant deficits and improve comfort and ability to complete daily tasks.   OBJECTIVE IMPAIRMENTS: Pain, ankle ROM, ankle strength, gait, balance  ACTIVITY LIMITATIONS: standing, walking, sitting, sleeping, shopping, housework, self care  PERSONAL FACTORS: See medical history and pertinent history   REHAB POTENTIAL: Good  CLINICAL DECISION MAKING: Evolving/moderate complexity  EVALUATION COMPLEXITY: Moderate   GOALS:   SHORT TERM GOALS: Target date: 07/25/2024   Kyrstyn will be >75% HEP compliant to improve carryover between sessions and facilitate independent management of condition  Evaluation: ongoing Goal status: INITIAL   LONG TERM GOALS: Target date: 08/22/2024   Timothy will self report >/= 50% decrease in pain from evaluation to improve function in daily tasks  Evaluation/Baseline: 10/10 max pain Goal status: INITIAL   2.  Angeliki will show a >/= 20 pt improvement in LEFS score  (MCID is ~11% or 9 pts) as a proxy for functional improvement   Evaluation/Baseline: 31 pts Goal status: INITIAL   3.  Alfonso will be able to walk to go grocery shopping, not limited by pain  Evaluation/Baseline: limited Goal status: INITIAL   4.  Jolene will improve left  ankle DF to 7 degrees   Evaluation/Baseline: lacking 15 degrees Goal status: INITIAL   5.  Xochilt will be able to perform bil heel raise without significant w/s, not limited by pain  Evaluation/Baseline: limited Goal status: INITIAL    PLAN: PT FREQUENCY: 1-2x/week  PT DURATION: 8 weeks  PLANNED INTERVENTIONS:  97164- PT Re-evaluation, 97110-Therapeutic exercises, 97530- Therapeutic activity, [id number]- Neuromuscular re-education, 97535- Self Care, 02859- Manual therapy, [id number]- Gait training, [id number]- Aquatic Therapy, [phone]- Electrical stimulation (manual), [id number]- Vasopneumatic device, [id number]- Traction (mechanical), [id number]- Ionotophoresis 4mg /ml Dexamethasone , Taping, Dry Needling, Joint manipulation, and Spinal manipulation.   Fabricio Endsley MS, PT 08/14/24 11:50 AM

## 2024-08-14 ENCOUNTER — Other Ambulatory Visit: Payer: Self-pay

## 2024-08-14 ENCOUNTER — Ambulatory Visit: Attending: Sports Medicine

## 2024-08-14 DIAGNOSIS — M25611 Stiffness of right shoulder, not elsewhere classified: Secondary | ICD-10-CM | POA: Diagnosis present

## 2024-08-14 DIAGNOSIS — M6281 Muscle weakness (generalized): Secondary | ICD-10-CM | POA: Diagnosis present

## 2024-08-14 DIAGNOSIS — M25511 Pain in right shoulder: Secondary | ICD-10-CM | POA: Insufficient documentation

## 2024-08-14 DIAGNOSIS — M25512 Pain in left shoulder: Secondary | ICD-10-CM | POA: Insufficient documentation

## 2024-08-14 DIAGNOSIS — G8929 Other chronic pain: Secondary | ICD-10-CM | POA: Insufficient documentation

## 2024-08-21 ENCOUNTER — Ambulatory Visit: Admitting: Physical Therapy

## 2024-08-24 DIAGNOSIS — Z419 Encounter for procedure for purposes other than remedying health state, unspecified: Secondary | ICD-10-CM | POA: Diagnosis not present

## 2024-08-28 ENCOUNTER — Ambulatory Visit: Payer: Self-pay | Admitting: Physical Therapy

## 2024-08-28 ENCOUNTER — Encounter: Payer: Self-pay | Admitting: Physical Therapy

## 2024-08-28 DIAGNOSIS — G8929 Other chronic pain: Secondary | ICD-10-CM

## 2024-08-28 DIAGNOSIS — M25511 Pain in right shoulder: Secondary | ICD-10-CM | POA: Diagnosis not present

## 2024-08-28 DIAGNOSIS — M25611 Stiffness of right shoulder, not elsewhere classified: Secondary | ICD-10-CM

## 2024-08-28 DIAGNOSIS — M6281 Muscle weakness (generalized): Secondary | ICD-10-CM

## 2024-08-28 NOTE — Therapy (Signed)
 OUTPATIENT PHYSICAL THERAPY PROGRESS NOTE  Patient Name: Tamara Barnes MRN: [id number] DOB:April 07, 1966, 58 y.o., female Today's Date: 08/28/2024   PT End of Session - 08/28/24 0801     Visit Number 5    Number of Visits 8    Date for Recertification  10/23/24    Authorization Type [employer]    Authorization Time Period approved 8 PT visits from 8 PT visits from 07/06/24-09/04/24    Authorization - Visit Number 5    Authorization - Number of Visits 8    PT Start Time 0800    PT Stop Time 0841    PT Time Calculation (min) 41 min    Activity Tolerance Patient tolerated treatment well           Past Medical History:  Diagnosis Date   Diabetes mellitus without complication (HCC)    Hypertension    Past Surgical History:  Procedure Laterality Date   FRACTURE SURGERY     HAND SURGERY Left    tuabl ligation     TUBAL LIGATION     Patient Active Problem List   Diagnosis Date Noted   Mass of joint of left hand 03/08/2023   Pain of left hand 01/13/2023   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) 08/14/2020   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) 08/14/2020   Nuclear sclerotic cataract of both eyes 08/14/2020   Vitreomacular adhesion of both eyes 08/14/2020   Abdominal pain    E coli bacteremia    Pyelonephritis 04/07/2020   Sinusitis 07/04/2012   Elevated BP 07/04/2012   Rhinitis medicamentosa 07/04/2012   FIBROIDS, UTERUS 02/24/2009   UNSPECIFIED ABNORMAL MAMMOGRAM 02/24/2009   DYSFUNCTIONAL UTERINE BLEEDING 08/02/2008   ANEMIA 06/04/2008   SCIATICA 06/04/2008   TOBACCO DEPENDENCE 11/10/2006    PCP: Vicci Barnie NOVAK, MD  REFERRING PROVIDER: Verta Royden DASEN, DPM  THERAPY DIAG:  Chronic pain of both shoulders - Plan: PT plan of care cert/re-cert  Muscle weakness (generalized) - Plan: PT plan of care cert/re-cert  Stiffness of right shoulder, not elsewhere classified - Plan: PT plan of care  cert/re-cert  REFERRING DIAG: Achilles tendinitis of left lower extremity [M76.62], Plantar fasciitis of left foot [M72.2]   Rationale for Evaluation and Treatment:  Rehabilitation  SUBJECTIVE:  PERTINENT PAST HISTORY:  Smoker, diabetic neuropathy      PRECAUTIONS: None  WEIGHT BEARING RESTRICTIONS No  FALLS:  Has patient fallen in last 6 months? No, Number of falls: 0  MOI/History of condition:  Onset date: ~6 months  SUBJECTIVE STATEMENT  08/28/2024:  Pt reports L post heel and achilles pain is much better, but she is still having pain when walking,  More mid portion at this point.  EVAL:  Pt is a 58 y.o. female who presents to clinic with chief complaint of L posterior heel pain which started about 6 months ago when stepping off a curb.  Originally thought to be rupture but MRI show tiny interstitial tear at insertion as well as PF.  Pt states that she had a few stiches and has been in a boot since march (sounds superficial).  States that podiatrist told her to try PT for 3 weeks.  Pt reports the pain can come with no load almost randomly.  Walking for longer distances is even worse.   Red flags:  denies   Pain:  Are you having pain? Yes Pain location: L distal achilles NPRS scale:  Best: 2/10, Worst: 10/10; Current 3/10 Aggravating factors:  any load and randomly Relieving factors: minimal Pain description: sharp and throbbing  Occupation: NA  Assistive Device: NA  Hand Dominance: NA  Patient Goals/Specific Activities: reduce pain and improve function   OBJECTIVE:   DIAGNOSTIC FINDINGS:   IMPRESSION: 1. Moderate distal Achilles tendinosis with a tiny interstitial tear at the insertion and mild subcortical marrow edema. 2. Moderate plantar fasciitis of the medial band of the plantar fascia.  GENERAL OBSERVATION/GAIT:  Antalgic gait, reduced time in stance R, in CAM boot  SENSATION: Light touch: Appears intact  PALPATION: TTP L achilles  tendon  LE ROM:  ROM Right (Eval) Left (Eval) L 12/16  Hip flexion (L2, L3)     Knee extension (L3)     Knee flexion     Hip abduction     Hip extension     Hip external rotation     Hip internal rotation     Hip adduction     Ankle dorsiflexion (L4) 7 Lacking 15 ** 5*  Ankle plantarflexion (S1)     Ankle inversion 30 30*   Ankle eversion 30 10*   Great Toe ext (L5)     Grossly      (Blank rows = not tested, score listed is out of 5 possible points OR may be listed in lbs of force.  N = WNL, D = diminished, C = clear for gross weakness with myotome testing, * = concordant pain with testing)  LE MMT:  MMT Right (Eval) Left (Eval) L 12/16  Hip flexion     Hip extension     Hip abduction     Hip adduction     Hip internal rotation     Hip external rotation     Knee extension     Knee flexion     Ankle dorsiflexion n 3+* 4  Ankle plantarflexion n 3* 3+*  Ankle inversion n 3+* 4  Ankle eversion n 3+* 4   (Blank rows = not tested, N = WNL, * = concordant pain with testing)  Functional Tests  Eval    Unable to FWB L foot d/t pain                                                          SPECIAL TESTS:  NA   PATIENT SURVEYS:  LEFS: 31/80    HOME EXERCISE PROGRAM: Access Code: [email or web address] URL: [email or web address] Date: 08/28/2024 Prepared by: Helene Gasmen  Exercises - Standing Heel Raise with Support  - 1 x daily - 7 x weekly - 1 sets - 4 reps - 10'' hold - Tandem Stance  - 1 x daily - 7 x weekly - 1 sets - 3 reps - 45'' hold  Treatment priorities   Eval 12/16       DF and eversion ROM Heavier loading       Progressive loading        Gait and w/b                         TODAY'S TREATMENT: [facility] Adult PT Treatment:  DATE: 08/28/24 Therapeutic Exercise: nu-step L5 3m LE only  Bil heel raise w/ L w/s - 8'' x4  Therapeutic Activity Standing hip abd Standing hip  ext Bil heel raise w/ L w/s - 8'' x4 STS - 2x5 - 15#  Neuromuscular re-ed: Tandem on foam  Manual Therapy STM L GS complex and achilles   [facility] Adult PT Treatment  08/07/2024:  Therapeutic Exercise: nu-step L5 62m while taking subjective and planning session with patient Inv/eversion - 3x50 PF/DF - 3x50 DF stretch with sheet - 45'' x3 Heel raise 25# - 6x10s PF with blue TB - 2x6x6s  Manual Therapy  STM L GS complex and achilles     ASSESSMENT:  CLINICAL IMPRESSION: 08/28/2024: Pt has made good progress toward all short and long term goals.  She has significantly improved her ankle DF ROM, has reduced pain, is able to walk for short distances, and has improved calf strength.  She is now having more mid portion vs insertional achilles pain.  She will benefit from continued therapy to address remaining pain and improve ability to walk in the community during shopping etc with reduced pain.  EVAL: Kerra is a 58 y.o. female who presents to clinic with signs and sxs consistent with L severe achilles pain which started about 6 months ago.  Has been in booth the entire time per her report.  Significant pain and tenderness to light touch and w/b.  Started with light ROM and strengthening as she is significantly lacking DF ROM.   Melanie will benefit from skilled PT to address relevant deficits and improve comfort and ability to complete daily tasks.   OBJECTIVE IMPAIRMENTS: Pain, ankle ROM, ankle strength, gait, balance  ACTIVITY LIMITATIONS: standing, walking, sitting, sleeping, shopping, housework, self care  PERSONAL FACTORS: See medical history and pertinent history   REHAB POTENTIAL: Good  CLINICAL DECISION MAKING: Evolving/moderate complexity  EVALUATION COMPLEXITY: Moderate   GOALS:   SHORT TERM GOALS: Target date: 07/25/2024   Selenne will be >75% HEP compliant to improve carryover between sessions and facilitate independent management of  condition  Evaluation: ongoing Goal status: MET   LONG TERM GOALS: Target date: 08/22/2024 extended to 10/23/2024    Allanah will self report >/= 50% decrease in pain from evaluation to improve function in daily tasks  Evaluation/Baseline: 10/10 max pain 12/16: 3-4/10 avg Goal status: MET   2.  Lorry will show a >/= 20 pt improvement in LEFS score (MCID is ~11% or 9 pts) as a proxy for functional improvement   Evaluation/Baseline: 31 pts Goal status: Ongoing   3.  Autym will be able to walk to go grocery shopping, not limited by pain  Evaluation/Baseline: limited 12/16: able to walk, but continues to have increasing pain Goal status: Ongiong    4.  Chonda will improve left  ankle DF to 7 degrees   Evaluation/Baseline: lacking 15 degrees 12/16: 5 degrees Goal status: Ongoing   5.  Martina will be able to perform bil heel raise without significant w/s, not limited by pain - (update to include SL heel raise 12/16)  Evaluation/Baseline: limited 12/16: MET bil Goal status: MET    PLAN: PT FREQUENCY: 1-2x/week  PT DURATION: 8 weeks  PLANNED INTERVENTIONS:  97164- PT Re-evaluation, 97110-Therapeutic exercises, 97530- Therapeutic activity, 97112- Neuromuscular re-education, 97535- Self Care, 02859- Manual therapy, [id number]- Gait training, [id number]- Aquatic Therapy, [phone]- Electrical stimulation (manual), [id number]- Vasopneumatic device, [id number]- Traction (mechanical), [id number]- Ionotophoresis 4mg /ml Dexamethasone , Taping, Dry Needling, Joint manipulation, and Spinal manipulation.  Shalev Helminiak E Adamariz Gillott PT 08/28/2024 11:05 AM   I just finished a MCD eval/recert.  Name: Tykerria Mccubbins  MRN: [id number] Please request 1x/week for 8 weeks.  Check all conditions that are expected to impact treatment: Musculoskeletal disorders    Check all possible CPT codes: 02889- Therapeutic Exercise, [phone]- Neuro Re-education, [phone] - Gait Training, [phone] - Manual Therapy, 97530 -  Therapeutic Activities, 97535 - Self Care, [phone] - Re-evaluation, [id number] - Mechanical traction, and [id number] - Aquatic therapy   Thank you!  MCD - Secure

## 2024-08-31 ENCOUNTER — Other Ambulatory Visit: Payer: Self-pay

## 2024-08-31 ENCOUNTER — Ambulatory Visit: Payer: Self-pay | Attending: Internal Medicine | Admitting: Internal Medicine

## 2024-08-31 VITALS — BP 146/83 | HR 80 | Ht 62.0 in | Wt 173.8 lb

## 2024-08-31 DIAGNOSIS — J329 Chronic sinusitis, unspecified: Secondary | ICD-10-CM | POA: Diagnosis not present

## 2024-08-31 DIAGNOSIS — Z794 Long term (current) use of insulin: Secondary | ICD-10-CM

## 2024-08-31 DIAGNOSIS — E113293 Type 2 diabetes mellitus with mild nonproliferative diabetic retinopathy without macular edema, bilateral: Secondary | ICD-10-CM

## 2024-08-31 DIAGNOSIS — I152 Hypertension secondary to endocrine disorders: Secondary | ICD-10-CM

## 2024-08-31 DIAGNOSIS — I10 Essential (primary) hypertension: Secondary | ICD-10-CM

## 2024-08-31 DIAGNOSIS — E1169 Type 2 diabetes mellitus with other specified complication: Secondary | ICD-10-CM | POA: Diagnosis not present

## 2024-08-31 DIAGNOSIS — Z7984 Long term (current) use of oral hypoglycemic drugs: Secondary | ICD-10-CM | POA: Diagnosis not present

## 2024-08-31 DIAGNOSIS — Z7985 Long-term (current) use of injectable non-insulin antidiabetic drugs: Secondary | ICD-10-CM

## 2024-08-31 DIAGNOSIS — R110 Nausea: Secondary | ICD-10-CM

## 2024-08-31 DIAGNOSIS — Z87891 Personal history of nicotine dependence: Secondary | ICD-10-CM

## 2024-08-31 DIAGNOSIS — F17200 Nicotine dependence, unspecified, uncomplicated: Secondary | ICD-10-CM

## 2024-08-31 LAB — POCT GLYCOSYLATED HEMOGLOBIN (HGB A1C): HbA1c, POC (controlled diabetic range): 7.8 % — AB (ref 0.0–7.0)

## 2024-08-31 LAB — GLUCOSE, POCT (MANUAL RESULT ENTRY): POC Glucose: 101 mg/dL — AB (ref 70–99)

## 2024-08-31 MED ORDER — AMLODIPINE BESYLATE 10 MG PO TABS
10.0000 mg | ORAL_TABLET | Freq: Every day | ORAL | 1 refills | Status: AC
  Filled 2024-08-31: qty 90, 90d supply, fill #0

## 2024-08-31 MED ORDER — LANTUS SOLOSTAR 100 UNIT/ML ~~LOC~~ SOPN
12.0000 [IU] | PEN_INJECTOR | Freq: Every day | SUBCUTANEOUS | 1 refills | Status: AC
  Filled 2024-08-31 (×2): qty 9, 75d supply, fill #0

## 2024-08-31 MED ORDER — ONDANSETRON HCL 4 MG PO TABS
4.0000 mg | ORAL_TABLET | Freq: Two times a day (BID) | ORAL | 0 refills | Status: AC | PRN
  Filled 2024-08-31: qty 10, 5d supply, fill #0

## 2024-08-31 NOTE — Patient Instructions (Addendum)
" °  VISIT SUMMARY: Today, we discussed the management of your diabetes, hypertension, and recent symptoms of nausea and bloating. We also reviewed your chronic sinusitis and your progress with quitting smoking.  YOUR PLAN: -TYPE 2 DIABETES MELLITUS  AND HYPERTENSION: Type 2 diabetes is a condition where your body does not use insulin  properly, leading to high blood sugar levels. . Your A1c has increased to 7.8, and we aim to get it below 7. Your blood sugars are generally well-controlled, but your morning levels are elevated. We have increased your Lantus  insulin  to 12 units daily and will continue your current doses of Trulicity  and Metformin . We rechecked your blood pressure today, and it was elevated at 158/80. We will follow up in one month to recheck your blood pressure. A referral has been submitted to [facility] for a diabetic eye exam. We also prescribed Zofran  for nausea as needed.  -ACUTE NAUSEA AND BLOATING: Nausea and bloating are feelings of discomfort in your stomach. You experienced these symptoms after eating [employer]. We recommend using Pepto-Bismol for nausea and bloating and have prescribed Zofran  for nausea as needed.  -CHRONIC SINUSITIS: Chronic sinusitis is long-term inflammation of the sinuses. You have persistent sinus congestion, and the Ipratropium nasal spray caused nosebleeds. We have referred you to an ENT specialist for further evaluation and management.  -NICOTINE  DEPENDENCE, IN REMISSION: Nicotine  dependence is an addiction to tobacco products. You have successfully quit smoking for three months. We encourage you to continue abstaining from smoking.  INSTRUCTIONS: Please follow up in one month for a blood pressure recheck. Additionally, attend the diabetic eye exam at [facility] as referred. Continue taking your medications as prescribed and use Pepto-Bismol or Zofran  as needed for nausea. Consult with the ENT specialist for your sinusitis as  scheduled.                      Contains text generated by Abridge.                                 Contains text generated by Abridge.   "

## 2024-08-31 NOTE — Progress Notes (Unsigned)
 "   Patient ID: Tamara Barnes, female    DOB: 08-13-1966  MRN: [id number]  CC: Medical Management of Chronic Issues (DM follow up /Bloating x this morning )   Subjective: Tamara Barnes is a 58 y.o. female who presents for chronic ds management. Her concerns today include:  Pt with hx of DM bilateral nonproliferative diabetic retinopathy,HTN,  tob dep, fibroids, vit D def.     Discussed the use of AI scribe software for clinical note transcription with the patient, who gave verbal consent to proceed.  History of Present Illness Tamara Barnes is a 58 year old female with diabetes and hypertension who presents for follow-up of her chronic medical conditions.  She experiences nausea and bloating after consuming [employer] this morning between 10 and 11 AM. There is no vomiting or diarrhea, but she has difficulty with bowel movements, having not had one today but did yesterday.  Her diabetes management includes Lantus  insulin  at 10 units once daily, Trulicity  0.75 mg, and Metformin  500 mg twice daily. She uses a continuous glucose monitor and reports average blood sugars of 101 in the morning, 140 between 12 PM to 6 PM, and 141 between 6 PM to 12 AM over the past week. Over the past two weeks, her morning blood sugars averaged 151, 147 between 12 PM to 6 PM, and 157 between 6 PM to 12 AM. She maintains a diet with sugar-free products but has gained weight since her last visit, now weighing 173 pounds, up from 161 pounds in May. She attributes some weight gain to quitting smoking three months ago.  For hypertension, she takes Lisinopril  10 mg daily and Lorazepam 50 mg daily. She does not have a blood pressure monitor at home but checks her blood pressure at a friend's house. No chest pain or shortness of breath, but she reports sinus congestion and epistaxis from using Ipratropium nasal spray, which she has discontinued.  She quit smoking three months ago and attributes some weight gain to  this change. She has recently had a foot injury, having been in a boot for ten months, and is now wearing a brace. She has not been able to exercise due to this injury.    Patient Active Problem List   Diagnosis Date Noted   Mass of joint of left hand 03/08/2023   Pain of left hand 01/13/2023   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) 08/14/2020   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) 08/14/2020   Nuclear sclerotic cataract of both eyes 08/14/2020   Vitreomacular adhesion of both eyes 08/14/2020   Abdominal pain    E coli bacteremia    Pyelonephritis 04/07/2020   Sinusitis 07/04/2012   Elevated BP 07/04/2012   Rhinitis medicamentosa 07/04/2012   FIBROIDS, UTERUS 02/24/2009   UNSPECIFIED ABNORMAL MAMMOGRAM 02/24/2009   DYSFUNCTIONAL UTERINE BLEEDING 08/02/2008   ANEMIA 06/04/2008   SCIATICA 06/04/2008   TOBACCO DEPENDENCE 11/10/2006     Medications Ordered Prior to Encounter[1]  Allergies[2]  Social History   Socioeconomic History   Marital status: Single    Spouse name: Not on file   Number of children: Not on file   Years of education: Not on file   Highest education level: Not on file  Occupational History   Not on file  Tobacco Use   Smoking status: Some Days    Current packs/day: 0.00    Types: Cigarettes    Last attempt to quit: 01/2024    Years since quitting:  0.6   Smokeless tobacco: Never  Vaping Use   Vaping status: Never Used  Substance and Sexual Activity   Alcohol use: Yes    Comment: socially   Drug use: No   Sexual activity: Not Currently    Birth control/protection: None  Other Topics Concern   Not on file  Social History Narrative   Not on file   Social Drivers of Health   Tobacco Use: High Risk (08/28/2024)   Patient History    Smoking Tobacco Use: Some Days    Smokeless Tobacco Use: Never    Passive Exposure: Not on file  Financial Resource Strain: High Risk (11/28/2023)   Overall  Financial Resource Strain (CARDIA)    Difficulty of Paying Living Expenses: Very hard  Food Insecurity: Food Insecurity Present (11/28/2023)   Hunger Vital Sign    Worried About Running Out of Food in the Last Year: Sometimes true    Ran Out of Food in the Last Year: Sometimes true  Transportation Needs: No Transportation Needs (11/28/2023)   PRAPARE - Administrator, Civil Service (Medical): No    Lack of Transportation (Non-Medical): No  Physical Activity: Insufficiently Active (11/28/2023)   Exercise Vital Sign    Days of Exercise per Week: 2 days    Minutes of Exercise per Session: 30 min  Stress: Stress Concern Present (11/28/2023)   [employer] of Occupational Health - Occupational Stress Questionnaire    Feeling of Stress : To some extent  Social Connections: Moderately Isolated (11/28/2023)   Social Connection and Isolation Panel    Frequency of Communication with Friends and Family: Once a week    Frequency of Social Gatherings with Friends and Family: Twice a week    Attends Religious Services: More than 4 times per year    Active Member of [employer] or Organizations: No    Attends Banker Meetings: Never    Marital Status: Never married  Intimate Partner Violence: Not At Risk (11/28/2023)   Humiliation, Afraid, Rape, and Kick questionnaire    Fear of Current or Ex-Partner: No    Emotionally Abused: No    Physically Abused: No    Sexually Abused: No  Depression (PHQ2-9): Low Risk (06/18/2024)   Depression (PHQ2-9)    PHQ-2 Score: 1  Alcohol Screen: Low Risk (11/28/2023)   Alcohol Screen    Last Alcohol Screening Score (AUDIT): 1  Housing: Low Risk (11/28/2023)   Housing Stability Vital Sign    Unable to Pay for Housing in the Last Year: No    Number of Times Moved in the Last Year: 0    Homeless in the Last Year: No  Utilities: Not At Risk (11/28/2023)   AHC Utilities    Threatened with loss of utilities: No  Health Literacy: Adequate Health  Literacy (11/28/2023)   B1300 Health Literacy    Frequency of need for help with medical instructions: Never    Family History  Problem Relation Age of Onset   Kidney failure Mother    Heart disease Mother    Liver disease Father    Stomach cancer Maternal Uncle 57   Colon cancer Neg Hx    Colon polyps Neg Hx    Esophageal cancer Neg Hx    Rectal cancer Neg Hx     Past Surgical History:  Procedure Laterality Date   FRACTURE SURGERY     HAND SURGERY Left    tuabl ligation     TUBAL LIGATION  ROS: Review of Systems Negative except as stated above  PHYSICAL EXAM: BP (!) 146/83   Pulse 80   Ht 5' 2 (1.575 m)   Wt 173 lb 12.8 oz (78.8 kg)   LMP 01/14/2015   SpO2 97%   BMI 31.79 kg/m   Wt Readings from Last 3 Encounters:  08/31/24 173 lb 12.8 oz (78.8 kg)  06/18/24 166 lb 3.2 oz (75.4 kg)  01/17/24 161 lb (73 kg)    Physical Exam  {female adult master:[id number]}      Latest Ref Rng & Units 01/17/2024   12:01 PM 04/08/2023    8:52 AM 09/02/2022    4:03 PM  CMP  Glucose 70 - 99 mg/dL 750  798    BUN 6 - 24 mg/dL 17  22    Creatinine 9.42 - 1.00 mg/dL 9.21  9.19    Sodium 865 - 144 mmol/L 140  140    Potassium 3.5 - 5.2 mmol/L 4.4  4.6    Chloride 96 - 106 mmol/L 103  108    CO2 20 - 29 mmol/L 19     Calcium 8.7 - 10.2 mg/dL 89.9     Total Protein 6.0 - 8.5 g/dL 7.4   7.7   Total Bilirubin 0.0 - 1.2 mg/dL <9.7   <9.7   Alkaline Phos 44 - 121 IU/L 126   106   AST 0 - 40 IU/L 14   15   ALT 0 - 32 IU/L 14   20    Lipid Panel     Component Value Date/Time   CHOL 195 01/17/2024 1201   TRIG 88 01/17/2024 1201   HDL 106 01/17/2024 1201   CHOLHDL 1.8 01/17/2024 1201   CHOLHDL 2.7 Ratio 06/04/2008 2136   VLDL 45 (H) 06/04/2008 2136   LDLCALC 74 01/17/2024 1201    CBC    Component Value Date/Time   WBC 5.6 01/17/2024 1201   WBC 10.2 04/09/2020 1048   RBC 4.79 01/17/2024 1201   RBC 4.30 04/09/2020 1048   HGB 14.0 01/17/2024 1201   HCT 43.7 01/17/2024  1201   PLT 194 01/17/2024 1201   MCV 91 01/17/2024 1201   MCH 29.2 01/17/2024 1201   MCH 29.1 04/09/2020 1048   MCHC 32.0 01/17/2024 1201   MCHC 32.5 04/09/2020 1048   RDW 12.9 01/17/2024 1201   LYMPHSABS 1.8 05/13/2021 0944   EOSABS 0.0 05/13/2021 0944   BASOSABS 0.0 05/13/2021 0944    ASSESSMENT AND PLAN:  Assessment and Plan Assessment & Plan 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 Blood sugars well-controlled but morning levels elevated. Metformin  causes diarrhea. Blood pressure elevated at 158/80, previously controlled. Sinus congestion may affect readings. - Increased Lantus  to 12 units daily. - Continue Trulicity  0.75 mg weekly. - Continue metformin  500 mg twice daily. - Rechecked blood pressure today. - Scheduled follow-up in one month for blood pressure recheck. - Submitted referral to [facility] for diabetic eye exam. - Prescribed Zofran  for nausea as needed.  Acute nausea and bloating Nausea and bloating after eating McDonald's, no vomiting or diarrhea. Persistent nausea without abdominal pain. - Advised use of Pepto-Bismol for nausea and bloating. - Prescribed Zofran  for nausea as needed.  Chronic sinusitis Persistent sinus congestion. Ipratropium nasal spray caused epistaxis. Flonase  and antihistamines not tolerated. - Referred to ENT specialist for further evaluation and management of sinusitis.  Nicotine  dependence, in  remission Nicotine  dependence in remission for three months. Weight gain noted since cessation. - Encouraged continued abstinence from smoking.     1. Hypertension associated with type 2 diabetes mellitus (HCC) ***  2. 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  Patient verbalized understanding of the plan and was able to repeat key elements of the plan.   This documentation was completed using Paediatric nurse.  Any transcriptional errors are unintentional.  Orders Placed This Encounter  Procedures   POCT glycosylated hemoglobin (Hb A1C)   POCT glucose (manual entry)     Requested Prescriptions   Pending Prescriptions Disp Refills   amLODipine  (NORVASC ) 10 MG tablet 90 tablet 1    Sig: Take 1 tablet (10 mg total) by mouth daily.    No follow-ups on file.  Barnie Louder, MD, FACP    [1]  Current Outpatient Medications on File Prior to Visit  Medication Sig Dispense Refill   Accu-Chek Softclix Lancets lancets Use as instructed to check blood sugar twice a day 100 each 12   acetaminophen  (TYLENOL ) 500 MG tablet Take 1 tablet (500 mg total) by mouth every 8 (eight) hours as needed. 60 tablet 1   amLODipine  (NORVASC ) 10 MG tablet Take 1 tablet (10 mg total) by mouth daily. 90 tablet 1   Blood Glucose Monitoring Suppl (ACCU-CHEK GUIDE) w/Device KIT Use to check blood sugar twice a day. 1 kit 0   Blood Pressure Monitoring (BLOOD PRESSURE KIT) DEVI Use to check blood pressure once daily. 1 each 0   celecoxib  (CELEBREX ) 200 MG capsule Take 1 capsule (200 mg total) by mouth 2 (two) times daily. 60 capsule 3   Continuous Glucose Receiver (FREESTYLE LIBRE 3 READER) DEVI Use as directed. 1 each 0   Continuous Glucose Sensor (FREESTYLE LIBRE 3 PLUS SENSOR) MISC Change sensor every 15 days. 2 each 12   diclofenac  (VOLTAREN ) 75 MG EC tablet Take 1 tablet (75 mg total) by mouth 2 (two) times daily. 60 tablet 3   Dulaglutide  (TRULICITY ) 0.75 MG/0.5ML SOAJ Inject 0.75 mg into the skin once a week. 2 mL 1   gabapentin  (NEURONTIN ) 300 MG capsule Take 2 capsules (600 mg total) by mouth at bedtime. 180 capsule 1   glucose blood (ACCU-CHEK GUIDE) test strip  Use as instructed to check blood sugar twice a day 100 each 12   ibuprofen  (ADVIL ) 800 MG tablet Take 1 tablet (800 mg total) by mouth 3 (three) times daily. 90 tablet 3   insulin  glargine (LANTUS  SOLOSTAR) 100 UNIT/ML Solostar Pen Inject 10 Units into the skin at bedtime. 15 mL 1   Insulin  Pen Needle (PEN NEEDLES) 31G X 8 MM MISC Use as directed with insulin . 100 each 6   ipratropium (ATROVENT ) 0.03 % nasal spray Place 2 sprays into both nostrils every 12 (twelve) hours. 30 mL 0   losartan  (COZAAR ) 50 MG tablet Take 1 tablet (50 mg total) by mouth daily. 90 tablet 1   metFORMIN  (GLUCOPHAGE -XR) 500 MG 24 hr tablet Take 1 tablet (500 mg total) by mouth 2 (two) times daily with a meal. 180 tablet 1   nicotine  (NICODERM CQ  - DOSED IN MG/24 HR) 7 mg/24hr patch Place 1 patch (7 mg total) onto the skin daily. 28 patch 0  promethazine -dextromethorphan (PROMETHAZINE -DM) 6.25-15 MG/5ML syrup Take 5 mLs by mouth 4 (four) times daily as needed for cough. 240 mL 0   No current facility-administered medications on file prior to visit.  [2]  Allergies Allergen Reactions   Oatmeal Shortness Of Breath and Other (See Comments)    Tongue swells   Rice Shortness Of Breath, Swelling and Other (See Comments)    Tongue swells   Wheat Shortness Of Breath, Swelling and Other (See Comments)    Tongue swells   Farxiga  [Dapagliflozin ] Other (See Comments)    Vaginal yeast infection   Lisinopril  Cough   "

## 2024-09-01 ENCOUNTER — Encounter: Payer: Self-pay | Admitting: Internal Medicine

## 2024-09-03 ENCOUNTER — Other Ambulatory Visit: Payer: Self-pay

## 2024-09-03 ENCOUNTER — Ambulatory Visit: Payer: Self-pay | Admitting: Physical Therapy

## 2024-09-03 ENCOUNTER — Encounter (INDEPENDENT_AMBULATORY_CARE_PROVIDER_SITE_OTHER): Payer: Self-pay

## 2024-09-04 ENCOUNTER — Other Ambulatory Visit (HOSPITAL_COMMUNITY): Payer: Self-pay

## 2024-09-10 ENCOUNTER — Ambulatory Visit: Admitting: Physical Therapy

## 2024-09-10 ENCOUNTER — Other Ambulatory Visit: Payer: Self-pay

## 2024-09-10 ENCOUNTER — Other Ambulatory Visit: Payer: Self-pay | Admitting: Internal Medicine

## 2024-09-11 ENCOUNTER — Ambulatory Visit: Admitting: Physical Therapy

## 2024-09-11 ENCOUNTER — Other Ambulatory Visit: Payer: Self-pay

## 2024-09-11 ENCOUNTER — Encounter: Payer: Self-pay | Admitting: Physical Therapy

## 2024-09-11 DIAGNOSIS — M6281 Muscle weakness (generalized): Secondary | ICD-10-CM

## 2024-09-11 DIAGNOSIS — M25511 Pain in right shoulder: Secondary | ICD-10-CM | POA: Diagnosis not present

## 2024-09-11 DIAGNOSIS — M25611 Stiffness of right shoulder, not elsewhere classified: Secondary | ICD-10-CM

## 2024-09-11 DIAGNOSIS — G8929 Other chronic pain: Secondary | ICD-10-CM

## 2024-09-11 MED ORDER — TRULICITY 0.75 MG/0.5ML ~~LOC~~ SOAJ
0.7500 mg | SUBCUTANEOUS | 1 refills | Status: AC
  Filled 2024-09-11: qty 6, 84d supply, fill #0
  Filled 2024-09-14: qty 2, 28d supply, fill #0
  Filled 2024-10-18: qty 2, 28d supply, fill #1

## 2024-09-11 NOTE — Therapy (Signed)
 " OUTPATIENT PHYSICAL THERAPY PROGRESS NOTE  Patient Name: Tamara Barnes MRN: [id number] DOB:1966/04/19, 58 y.o., female Today's Date: 09/11/2024   PT End of Session - 09/11/24 0846     Visit Number 6    Number of Visits 9    Date for Recertification  10/23/24    Authorization Type [employer]    Authorization Time Period approved 8 PT visits from 8 PT visits from 07/06/24-09/04/24, Approved 4 PT visits from 09/11/24-10/15/24    Authorization - Visit Number 1    Authorization - Number of Visits 4    PT Start Time 0846    PT Stop Time 0927    PT Time Calculation (min) 41 min    Activity Tolerance Patient tolerated treatment well           Past Medical History:  Diagnosis Date   Diabetes mellitus without complication (HCC)    Hypertension    Past Surgical History:  Procedure Laterality Date   FRACTURE SURGERY     HAND SURGERY Left    tuabl ligation     TUBAL LIGATION     Patient Active Problem List   Diagnosis Date Noted   Mass of joint of left hand 03/08/2023   Pain of left hand 01/13/2023   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) 08/14/2020   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) 08/14/2020   Nuclear sclerotic cataract of both eyes 08/14/2020   Vitreomacular adhesion of both eyes 08/14/2020   Abdominal pain    E coli bacteremia    Pyelonephritis 04/07/2020   Sinusitis 07/04/2012   Elevated BP 07/04/2012   Rhinitis medicamentosa 07/04/2012   FIBROIDS, UTERUS 02/24/2009   UNSPECIFIED ABNORMAL MAMMOGRAM 02/24/2009   DYSFUNCTIONAL UTERINE BLEEDING 08/02/2008   ANEMIA 06/04/2008   SCIATICA 06/04/2008   TOBACCO DEPENDENCE 11/10/2006    PCP: Vicci Barnie NOVAK, MD  REFERRING PROVIDER: Vicci Barnie NOVAK, MD  THERAPY DIAG:  Chronic pain of both shoulders  Muscle weakness (generalized)  Stiffness of right shoulder, not elsewhere classified  REFERRING DIAG: Achilles tendinitis of left lower extremity [M76.62],  Plantar fasciitis of left foot [M72.2]   Rationale for Evaluation and Treatment:  Rehabilitation  SUBJECTIVE:  PERTINENT PAST HISTORY:  Smoker, diabetic neuropathy      PRECAUTIONS: None  WEIGHT BEARING RESTRICTIONS No  FALLS:  Has patient fallen in last 6 months? No, Number of falls: 0  MOI/History of condition:  Onset date: ~6 months  SUBJECTIVE STATEMENT  09/11/2024:  Pt reports that she was doing well overall but over the last 3 days she has been having more issues.  She rates her pain currently 6-7/10.  EVAL:  Pt is a 58 y.o. female who presents to clinic with chief complaint of L posterior heel pain which started about 6 months ago when stepping off a curb.  Originally thought to be rupture but MRI show tiny interstitial tear at insertion as well as PF.  Pt states that she had a few stiches and has been in a boot since march (sounds superficial).  States that podiatrist told her to try PT for 3 weeks.  Pt reports the pain can come with no load almost randomly.  Walking for longer distances is even worse.   Red flags:  denies   Pain:  Are you having pain? Yes Pain location: L distal achilles NPRS scale:  6-7/10 Aggravating factors: any load and randomly Relieving factors: minimal Pain description: sharp and throbbing  Occupation: NA  Assistive Device:  NA  Hand Dominance: NA  Patient Goals/Specific Activities: reduce pain and improve function   OBJECTIVE:   DIAGNOSTIC FINDINGS:   IMPRESSION: 1. Moderate distal Achilles tendinosis with a tiny interstitial tear at the insertion and mild subcortical marrow edema. 2. Moderate plantar fasciitis of the medial band of the plantar fascia.  GENERAL OBSERVATION/GAIT:  Antalgic gait, reduced time in stance R, in CAM boot  SENSATION: Light touch: Appears intact  PALPATION: TTP L achilles tendon  LE ROM:  ROM Right (Eval) Left (Eval) L 12/16  Hip flexion (L2, L3)     Knee extension (L3)     Knee  flexion     Hip abduction     Hip extension     Hip external rotation     Hip internal rotation     Hip adduction     Ankle dorsiflexion (L4) 7 Lacking 15 ** 5*  Ankle plantarflexion (S1)     Ankle inversion 30 30*   Ankle eversion 30 10*   Great Toe ext (L5)     Grossly      (Blank rows = not tested, score listed is out of 5 possible points OR may be listed in lbs of force.  N = WNL, D = diminished, C = clear for gross weakness with myotome testing, * = concordant pain with testing)  LE MMT:  MMT Right (Eval) Left (Eval) L 12/16  Hip flexion     Hip extension     Hip abduction     Hip adduction     Hip internal rotation     Hip external rotation     Knee extension     Knee flexion     Ankle dorsiflexion n 3+* 4  Ankle plantarflexion n 3* 3+*  Ankle inversion n 3+* 4  Ankle eversion n 3+* 4   (Blank rows = not tested, N = WNL, * = concordant pain with testing)  Functional Tests  Eval    Unable to FWB L foot d/t pain                                                          SPECIAL TESTS:  NA   PATIENT SURVEYS:  LEFS: 31/80    HOME EXERCISE PROGRAM: Access Code: [email or web address] URL: [email or web address] Date: 09/11/2024 Prepared by: Helene Gasmen  Exercises - Standing Heel Raise with Support  - 1 x daily - 7 x weekly - 1 sets - 5 reps - 8 second hold - Sit to Stand Without Arm Support  - 1 x daily - 4-7 x weekly - 3 sets - 5 reps - Standing Hip Abduction with Resistance at Ankles and Unilateral Counter Support  - 1 x daily - 7 x weekly - 2 sets - 10 reps  Treatment priorities   Eval 12/16       DF and eversion ROM Heavier loading       Progressive loading        Gait and w/b                         TODAY'S TREATMENT: [facility] Adult PT Treatment:  DATE: 09/11/24 Therapeutic Exercise: nu-step L5 19m LE only   Therapeutic Activity Standing hip abd w/ RTB Bil heel raise w/ L w/s -  8'' x5 STS - 3x5 - 15# Bil heel raise w/ L w/s - 8'' x4  Manual Therapy STM and IASTM L GS complex and achilles   [facility] Adult PT Treatment  08/07/2024:  Therapeutic Exercise: nu-step L5 60m while taking subjective and planning session with patient Inv/eversion - 3x50 PF/DF - 3x50 DF stretch with sheet - 45'' x3 Heel raise 25# - 6x10s PF with blue TB - 2x6x6s  Manual Therapy  STM L GS complex and achilles     ASSESSMENT:  CLINICAL IMPRESSION: 09/11/2024: Margurete tolerated session well with no adverse reaction.  Higher starting pain today at 6-7/10 but this reduced with manual and was not agged by loading. Reviewed HEP and principles of loading.  Pt leaves with significant reduction in pain and minimal antalgic gait.  EVAL: Kairah is a 58 y.o. female who presents to clinic with signs and sxs consistent with L severe achilles pain which started about 6 months ago.  Has been in booth the entire time per her report.  Significant pain and tenderness to light touch and w/b.  Started with light ROM and strengthening as she is significantly lacking DF ROM.   Milca will benefit from skilled PT to address relevant deficits and improve comfort and ability to complete daily tasks.   OBJECTIVE IMPAIRMENTS: Pain, ankle ROM, ankle strength, gait, balance  ACTIVITY LIMITATIONS: standing, walking, sitting, sleeping, shopping, housework, self care  PERSONAL FACTORS: See medical history and pertinent history   REHAB POTENTIAL: Good  CLINICAL DECISION MAKING: Evolving/moderate complexity  EVALUATION COMPLEXITY: Moderate   GOALS:   SHORT TERM GOALS: Target date: 07/25/2024   Wealthy will be >75% HEP compliant to improve carryover between sessions and facilitate independent management of condition  Evaluation: ongoing Goal status: MET   LONG TERM GOALS: Target date: 08/22/2024 extended to 10/23/2024    Robynn will self report >/= 50% decrease in pain from evaluation to  improve function in daily tasks  Evaluation/Baseline: 10/10 max pain 12/16: 3-4/10 avg Goal status: MET   2.  Constantina will show a >/= 20 pt improvement in LEFS score (MCID is ~11% or 9 pts) as a proxy for functional improvement   Evaluation/Baseline: 31 pts Goal status: Ongoing   3.  Zosia will be able to walk to go grocery shopping, not limited by pain  Evaluation/Baseline: limited 12/16: able to walk, but continues to have increasing pain Goal status: Ongiong    4.  Amiayah will improve left  ankle DF to 7 degrees   Evaluation/Baseline: lacking 15 degrees 12/16: 5 degrees Goal status: Ongoing   5.  Jacky will be able to perform bil heel raise without significant w/s, not limited by pain - (update to include SL heel raise 12/16)  Evaluation/Baseline: limited 12/16: MET bil Goal status: MET    PLAN: PT FREQUENCY: 1-2x/week  PT DURATION: 8 weeks  PLANNED INTERVENTIONS:  [id number]- PT Re-evaluation, 97110-Therapeutic exercises, 97530- Therapeutic activity, 97112- Neuromuscular re-education, 97535- Self Care, 02859- Manual therapy, [id number]- Gait training, [id number]- Aquatic Therapy, [phone]- Electrical stimulation (manual), [id number]- Vasopneumatic device, [id number]- Traction (mechanical), [id number]- Ionotophoresis 4mg /ml Dexamethasone , Taping, Dry Needling, Joint manipulation, and Spinal manipulation.   Kein Carlberg E Kalyiah Saintil PT 09/11/2024 9:25 AM   I just finished a MCD eval/recert.  Name: Caroleena Paolini  MRN: [id number] Please request 1x/week for 8 weeks.  Check all conditions that  are expected to impact treatment: Musculoskeletal disorders    Check all possible CPT codes: 02889- Therapeutic Exercise, [phone]- Neuro Re-education, [phone] - Gait Training, [phone] - Manual Therapy, 97530 - Therapeutic Activities, 97535 - Self Care, [phone] - Re-evaluation, [id number] - Mechanical traction, and [phone] - Aquatic therapy   Thank you!  MCD - Secure      "

## 2024-09-14 ENCOUNTER — Other Ambulatory Visit: Payer: Self-pay

## 2024-09-17 ENCOUNTER — Other Ambulatory Visit: Payer: Self-pay

## 2024-09-17 DIAGNOSIS — Z794 Long term (current) use of insulin: Secondary | ICD-10-CM

## 2024-09-18 MED ORDER — FREESTYLE LIBRE 3 READER DEVI
0 refills | Status: AC
  Filled 2024-09-18: qty 1, fill #0

## 2024-09-19 ENCOUNTER — Other Ambulatory Visit: Payer: Self-pay

## 2024-09-20 ENCOUNTER — Encounter: Payer: Self-pay | Admitting: Physical Therapy

## 2024-09-20 ENCOUNTER — Ambulatory Visit: Attending: Sports Medicine | Admitting: Physical Therapy

## 2024-09-20 DIAGNOSIS — M25512 Pain in left shoulder: Secondary | ICD-10-CM | POA: Diagnosis present

## 2024-09-20 DIAGNOSIS — M25511 Pain in right shoulder: Secondary | ICD-10-CM | POA: Insufficient documentation

## 2024-09-20 DIAGNOSIS — M25611 Stiffness of right shoulder, not elsewhere classified: Secondary | ICD-10-CM | POA: Diagnosis present

## 2024-09-20 DIAGNOSIS — G8929 Other chronic pain: Secondary | ICD-10-CM | POA: Diagnosis present

## 2024-09-20 DIAGNOSIS — M6281 Muscle weakness (generalized): Secondary | ICD-10-CM | POA: Insufficient documentation

## 2024-09-20 NOTE — Therapy (Signed)
 " OUTPATIENT PHYSICAL THERAPY PROGRESS NOTE  Patient Name: Tamara Barnes MRN: [id number] DOB:1966/02/01, 59 y.o., female Today's Date: 09/20/2024   PT End of Session - 09/20/24 0759     Visit Number 7    Number of Visits 9    Date for Recertification  10/23/24    Authorization Type [employer]    Authorization Time Period approved 8 PT visits from 8 PT visits from 07/06/24-09/04/24, Approved 4 PT visits from 09/11/24-10/15/24    Authorization - Visit Number 2    Authorization - Number of Visits 4    PT Start Time 0800    PT Stop Time 0841    PT Time Calculation (min) 41 min    Activity Tolerance Patient tolerated treatment well           Past Medical History:  Diagnosis Date   Diabetes mellitus without complication (HCC)    Hypertension    Past Surgical History:  Procedure Laterality Date   FRACTURE SURGERY     HAND SURGERY Left    tuabl ligation     TUBAL LIGATION     Patient Active Problem List   Diagnosis Date Noted   Mass of joint of left hand 03/08/2023   Pain of left hand 01/13/2023   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) 08/14/2020   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) 08/14/2020   Nuclear sclerotic cataract of both eyes 08/14/2020   Vitreomacular adhesion of both eyes 08/14/2020   Abdominal pain    E coli bacteremia    Pyelonephritis 04/07/2020   Sinusitis 07/04/2012   Elevated BP 07/04/2012   Rhinitis medicamentosa 07/04/2012   FIBROIDS, UTERUS 02/24/2009   UNSPECIFIED ABNORMAL MAMMOGRAM 02/24/2009   DYSFUNCTIONAL UTERINE BLEEDING 08/02/2008   ANEMIA 06/04/2008   SCIATICA 06/04/2008   TOBACCO DEPENDENCE 11/10/2006    PCP: Vicci Barnie NOVAK, MD  REFERRING PROVIDER: Vicci Barnie NOVAK, MD  THERAPY DIAG:  Chronic pain of both shoulders  Muscle weakness (generalized)  Stiffness of right shoulder, not elsewhere classified  REFERRING DIAG: Achilles tendinitis of left lower extremity [M76.62],  Plantar fasciitis of left foot [M72.2]   Rationale for Evaluation and Treatment:  Rehabilitation  SUBJECTIVE:  PERTINENT PAST HISTORY:  Smoker, diabetic neuropathy      PRECAUTIONS: None  WEIGHT BEARING RESTRICTIONS No  FALLS:  Has patient fallen in last 6 months? No, Number of falls: 0  MOI/History of condition:  Onset date: ~6 months  SUBJECTIVE STATEMENT  09/20/2024:  Pt reports that her ankle has been a bit painful, mainly with longer walking (30-45 min)  EVAL:  Pt is a 59 y.o. female who presents to clinic with chief complaint of L posterior heel pain which started about 6 months ago when stepping off a curb.  Originally thought to be rupture but MRI show tiny interstitial tear at insertion as well as PF.  Pt states that she had a few stiches and has been in a boot since march (sounds superficial).  States that podiatrist told her to try PT for 3 weeks.  Pt reports the pain can come with no load almost randomly.  Walking for longer distances is even worse.   Red flags:  denies   Pain:  Are you having pain? Yes Pain location: L distal achilles NPRS scale:  6-7/10 Aggravating factors: any load and randomly Relieving factors: minimal Pain description: sharp and throbbing  Occupation: NA  Assistive Device: NA  Hand Dominance: NA  Patient Goals/Specific Activities: reduce pain  and improve function   OBJECTIVE:   DIAGNOSTIC FINDINGS:   IMPRESSION: 1. Moderate distal Achilles tendinosis with a tiny interstitial tear at the insertion and mild subcortical marrow edema. 2. Moderate plantar fasciitis of the medial band of the plantar fascia.  GENERAL OBSERVATION/GAIT:  Antalgic gait, reduced time in stance R, in CAM boot  SENSATION: Light touch: Appears intact  PALPATION: TTP L achilles tendon  LE ROM:  ROM Right (Eval) Left (Eval) L 12/16  Hip flexion (L2, L3)     Knee extension (L3)     Knee flexion     Hip abduction     Hip extension     Hip  external rotation     Hip internal rotation     Hip adduction     Ankle dorsiflexion (L4) 7 Lacking 15 ** 5*  Ankle plantarflexion (S1)     Ankle inversion 30 30*   Ankle eversion 30 10*   Great Toe ext (L5)     Grossly      (Blank rows = not tested, score listed is out of 5 possible points OR may be listed in lbs of force.  N = WNL, D = diminished, C = clear for gross weakness with myotome testing, * = concordant pain with testing)  LE MMT:  MMT Right (Eval) Left (Eval) L 12/16  Hip flexion     Hip extension     Hip abduction     Hip adduction     Hip internal rotation     Hip external rotation     Knee extension     Knee flexion     Ankle dorsiflexion n 3+* 4  Ankle plantarflexion n 3* 3+*  Ankle inversion n 3+* 4  Ankle eversion n 3+* 4   (Blank rows = not tested, N = WNL, * = concordant pain with testing)  Functional Tests  Eval    Unable to FWB L foot d/t pain                                                          SPECIAL TESTS:  NA   PATIENT SURVEYS:  LEFS: 31/80    HOME EXERCISE PROGRAM: Access Code: [email or web address] URL: [email or web address] Date: 09/11/2024 Prepared by: Helene Gasmen  Exercises - Standing Heel Raise with Support  - 1 x daily - 7 x weekly - 1 sets - 5 reps - 8 second hold - Sit to Stand Without Arm Support  - 1 x daily - 4-7 x weekly - 3 sets - 5 reps - Standing Hip Abduction with Resistance at Ankles and Unilateral Counter Support  - 1 x daily - 7 x weekly - 2 sets - 10 reps  Treatment priorities   Eval 12/16       DF and eversion ROM Heavier loading       Progressive loading        Gait and w/b                         TODAY'S TREATMENT: [facility] Adult PT Treatment:  DATE: 09/21/23 Therapeutic Exercise: bike SL knee ext machine - 2x7x2s - 5# HS curl - 2x7x2s - 5#  Therapeutic Activity STS - 8x5 - 15# Bil heel raise w/ L w/s - 8'' x2, 2x SL  Manual  Therapy STM and IASTM L GS complex and achilles   [facility] Adult PT Treatment  08/07/2024:  Therapeutic Exercise: nu-step L5 45m while taking subjective and planning session with patient Inv/eversion - 3x50 PF/DF - 3x50 DF stretch with sheet - 45'' x3 Heel raise 25# - 6x10s PF with blue TB - 2x6x6s  Manual Therapy  STM L GS complex and achilles     ASSESSMENT:  CLINICAL IMPRESSION: 09/20/2024: Tamara Barnes tolerated session well with no adverse reaction.  Concentration: progressive achilles loading and general hip and knee strengthening.  Progressed to SL isometrics with UE support with transient increase in pain.  Lower pain following manual.  Overall significant improvement and is now able to tolerate over 30 min of walking.  EVAL: Tamara Barnes is a 59 y.o. female who presents to clinic with signs and sxs consistent with L severe achilles pain which started about 6 months ago.  Has been in booth the entire time per her report.  Significant pain and tenderness to light touch and w/b.  Started with light ROM and strengthening as she is significantly lacking DF ROM.   Tamara Barnes will benefit from skilled PT to address relevant deficits and improve comfort and ability to complete daily tasks.   OBJECTIVE IMPAIRMENTS: Pain, ankle ROM, ankle strength, gait, balance  ACTIVITY LIMITATIONS: standing, walking, sitting, sleeping, shopping, housework, self care  PERSONAL FACTORS: See medical history and pertinent history   REHAB POTENTIAL: Good  CLINICAL DECISION MAKING: Evolving/moderate complexity  EVALUATION COMPLEXITY: Moderate   GOALS:   SHORT TERM GOALS: Target date: 07/25/2024   Tamara Barnes will be >75% HEP compliant to improve carryover between sessions and facilitate independent management of condition  Evaluation: ongoing Goal status: MET   LONG TERM GOALS: Target date: 08/22/2024 extended to 10/23/2024    Tamara Barnes will self report >/= 50% decrease in pain from evaluation to  improve function in daily tasks  Evaluation/Baseline: 10/10 max pain 12/16: 3-4/10 avg Goal status: MET   2.  Tamara Barnes will show a >/= 20 pt improvement in LEFS score (MCID is ~11% or 9 pts) as a proxy for functional improvement   Evaluation/Baseline: 31 pts Goal status: Ongoing   3.  Tamara Barnes will be able to walk to go grocery shopping, not limited by pain  Evaluation/Baseline: limited 12/16: able to walk, but continues to have increasing pain Goal status: Ongiong    4.  Tamara Barnes will improve left  ankle DF to 7 degrees   Evaluation/Baseline: lacking 15 degrees 12/16: 5 degrees Goal status: Ongoing   5.  Tamara Barnes will be able to perform bil heel raise without significant w/s, not limited by pain - (update to include SL heel raise 12/16)  Evaluation/Baseline: limited 12/16: MET bil Goal status: MET    PLAN: PT FREQUENCY: 1-2x/week  PT DURATION: 8 weeks  PLANNED INTERVENTIONS:  97164- PT Re-evaluation, 97110-Therapeutic exercises, 97530- Therapeutic activity, 97112- Neuromuscular re-education, 97535- Self Care, 02859- Manual therapy, [id number]- Gait training, [id number]- Aquatic Therapy, [phone]- Electrical stimulation (manual), [id number]- Vasopneumatic device, [id number]- Traction (mechanical), [id number]- Ionotophoresis 4mg /ml Dexamethasone , Taping, Dry Needling, Joint manipulation, and Spinal manipulation.   Tamara Barnes PT 09/20/2024 10:00 AM   I just finished a MCD eval/recert.  Name: Tamara Barnes  MRN: [id number] Please request 1x/week for 8 weeks.  Check all conditions that are expected to impact treatment: Musculoskeletal disorders    Check all possible CPT codes: 02889- Therapeutic Exercise, [phone]- Neuro Re-education, [phone] - Gait Training, [phone] - Manual Therapy, 97530 - Therapeutic Activities, 97535 - Self Care, [phone] - Re-evaluation, [id number] - Mechanical traction, and [id number] - Aquatic therapy   Thank you!  MCD - Secure      "

## 2024-09-21 ENCOUNTER — Other Ambulatory Visit: Payer: Self-pay

## 2024-09-21 MED ORDER — SOD FLUORIDE-POTASSIUM NITRATE 1.1-5 % DT GEL
DENTAL | 2 refills | Status: AC
  Filled 2024-09-21: qty 100, 30d supply, fill #0

## 2024-09-24 ENCOUNTER — Other Ambulatory Visit: Payer: Self-pay

## 2024-09-28 ENCOUNTER — Ambulatory Visit: Payer: Self-pay | Admitting: Pharmacist

## 2024-09-28 ENCOUNTER — Ambulatory Visit: Admitting: Physical Therapy

## 2024-09-28 ENCOUNTER — Encounter: Payer: Self-pay | Admitting: Physical Therapy

## 2024-09-28 DIAGNOSIS — M6281 Muscle weakness (generalized): Secondary | ICD-10-CM

## 2024-09-28 DIAGNOSIS — M25511 Pain in right shoulder: Secondary | ICD-10-CM | POA: Diagnosis not present

## 2024-09-28 DIAGNOSIS — G8929 Other chronic pain: Secondary | ICD-10-CM

## 2024-09-28 DIAGNOSIS — M25611 Stiffness of right shoulder, not elsewhere classified: Secondary | ICD-10-CM

## 2024-09-28 NOTE — Therapy (Signed)
 " OUTPATIENT PHYSICAL THERAPY PROGRESS NOTE  Patient Name: Tamara Barnes MRN: [id number] DOB:11-21-1965, 59 y.o., female Today's Date: 09/28/2024   PT End of Session - 09/28/24 0841     Visit Number 8    Number of Visits 9    Date for Recertification  10/23/24    Authorization Type [employer]    Authorization Time Period approved 8 PT visits from 8 PT visits from 07/06/24-09/04/24, Approved 4 PT visits from 09/11/24-10/15/24    Authorization - Visit Number 3    Authorization - Number of Visits 4    PT Start Time 0845    PT Stop Time 0926    PT Time Calculation (min) 41 min    Activity Tolerance Patient tolerated treatment well           Past Medical History:  Diagnosis Date   Diabetes mellitus without complication (HCC)    Hypertension    Past Surgical History:  Procedure Laterality Date   FRACTURE SURGERY     HAND SURGERY Left    tuabl ligation     TUBAL LIGATION     Patient Active Problem List   Diagnosis Date Noted   Mass of joint of left hand 03/08/2023   Pain of left hand 01/13/2023   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) 08/14/2020   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) 08/14/2020   Nuclear sclerotic cataract of both eyes 08/14/2020   Vitreomacular adhesion of both eyes 08/14/2020   Abdominal pain    E coli bacteremia    Pyelonephritis 04/07/2020   Sinusitis 07/04/2012   Elevated BP 07/04/2012   Rhinitis medicamentosa 07/04/2012   FIBROIDS, UTERUS 02/24/2009   UNSPECIFIED ABNORMAL MAMMOGRAM 02/24/2009   DYSFUNCTIONAL UTERINE BLEEDING 08/02/2008   ANEMIA 06/04/2008   SCIATICA 06/04/2008   TOBACCO DEPENDENCE 11/10/2006    PCP: Vicci Barnie NOVAK, MD  REFERRING PROVIDER: Vicci Barnie NOVAK, MD  THERAPY DIAG:  Chronic pain of both shoulders  Muscle weakness (generalized)  Stiffness of right shoulder, not elsewhere classified  REFERRING DIAG: Achilles tendinitis of left lower extremity [M76.62],  Plantar fasciitis of left foot [M72.2]   Rationale for Evaluation and Treatment:  Rehabilitation  SUBJECTIVE:  PERTINENT PAST HISTORY:  Smoker, diabetic neuropathy      PRECAUTIONS: None  WEIGHT BEARING RESTRICTIONS No  FALLS:  Has patient fallen in last 6 months? No, Number of falls: 0  MOI/History of condition:  Onset date: ~6 months  SUBJECTIVE STATEMENT  09/28/2024:  Pt reports that her ankle is doing well overall   EVAL:  Pt is a 59 y.o. female who presents to clinic with chief complaint of L posterior heel pain which started about 6 months ago when stepping off a curb.  Originally thought to be rupture but MRI show tiny interstitial tear at insertion as well as PF.  Pt states that she had a few stiches and has been in a boot since march (sounds superficial).  States that podiatrist told her to try PT for 3 weeks.  Pt reports the pain can come with no load almost randomly.  Walking for longer distances is even worse.   Red flags:  denies   Pain:  Are you having pain? Yes Pain location: L distal achilles NPRS scale:  0/10 currently Aggravating factors: any load and randomly Relieving factors: minimal Pain description: sharp and throbbing  Occupation: NA  Assistive Device: NA  Hand Dominance: NA  Patient Goals/Specific Activities: reduce pain and improve function  OBJECTIVE:   DIAGNOSTIC FINDINGS:   IMPRESSION: 1. Moderate distal Achilles tendinosis with a tiny interstitial tear at the insertion and mild subcortical marrow edema. 2. Moderate plantar fasciitis of the medial band of the plantar fascia.  GENERAL OBSERVATION/GAIT:  Antalgic gait, reduced time in stance R, in CAM boot  SENSATION: Light touch: Appears intact  PALPATION: TTP L achilles tendon  LE ROM:  ROM Right (Eval) Left (Eval) L 12/16  Hip flexion (L2, L3)     Knee extension (L3)     Knee flexion     Hip abduction     Hip extension     Hip external rotation     Hip  internal rotation     Hip adduction     Ankle dorsiflexion (L4) 7 Lacking 15 ** 5*  Ankle plantarflexion (S1)     Ankle inversion 30 30*   Ankle eversion 30 10*   Great Toe ext (L5)     Grossly      (Blank rows = not tested, score listed is out of 5 possible points OR may be listed in lbs of force.  N = WNL, D = diminished, C = clear for gross weakness with myotome testing, * = concordant pain with testing)  LE MMT:  MMT Right (Eval) Left (Eval) L 12/16  Hip flexion     Hip extension     Hip abduction     Hip adduction     Hip internal rotation     Hip external rotation     Knee extension     Knee flexion     Ankle dorsiflexion n 3+* 4  Ankle plantarflexion n 3* 3+*  Ankle inversion n 3+* 4  Ankle eversion n 3+* 4   (Blank rows = not tested, N = WNL, * = concordant pain with testing)  Functional Tests  Eval    Unable to FWB L foot d/t pain                                                          SPECIAL TESTS:  NA   PATIENT SURVEYS:  LEFS: 31/80    HOME EXERCISE PROGRAM: Access Code: [email or web address] URL: [email or web address] Date: 09/11/2024 Prepared by: Helene Gasmen  Exercises - Standing Heel Raise with Support  - 1 x daily - 7 x weekly - 1 sets - 5 reps - 8 second hold - Sit to Stand Without Arm Support  - 1 x daily - 4-7 x weekly - 3 sets - 5 reps - Standing Hip Abduction with Resistance at Ankles and Unilateral Counter Support  - 1 x daily - 7 x weekly - 2 sets - 10 reps  Treatment priorities   Eval 12/16       DF and eversion ROM Heavier loading       Progressive loading        Gait and w/b                         TODAY'S TREATMENT: [facility] Adult PT Treatment:  DATE: 09/29/23 Therapeutic Exercise: Nu-step - 4 min Supine isometric clam - 2x5x10''  Therapeutic Activity STS - 2x5 - 20# Bridge - 2x5x5s Bil heel raise w/ L w/s - 8'' x1, 3x SL  Manual Therapy STM and IASTM  L GS complex and achilles   [facility] Adult PT Treatment  08/07/2024:  Therapeutic Exercise: nu-step L5 82m while taking subjective and planning session with patient Inv/eversion - 3x50 PF/DF - 3x50 DF stretch with sheet - 45'' x3 Heel raise 25# - 6x10s PF with blue TB - 2x6x6s  Manual Therapy  STM L GS complex and achilles     ASSESSMENT:  CLINICAL IMPRESSION: 09/28/2024: Truc tolerated session well with no adverse reaction.  Concentration: progressive achilles loading and general hip and knee strengthening.  Progressed volume of SL isometrics with UE support with transient increase in pain.  Lower pain following manual.  Plan on D/C next visit.  EVAL: Danamarie is a 59 y.o. female who presents to clinic with signs and sxs consistent with L severe achilles pain which started about 6 months ago.  Has been in booth the entire time per her report.  Significant pain and tenderness to light touch and w/b.  Started with light ROM and strengthening as she is significantly lacking DF ROM.   Laikyn will benefit from skilled PT to address relevant deficits and improve comfort and ability to complete daily tasks.   OBJECTIVE IMPAIRMENTS: Pain, ankle ROM, ankle strength, gait, balance  ACTIVITY LIMITATIONS: standing, walking, sitting, sleeping, shopping, housework, self care  PERSONAL FACTORS: See medical history and pertinent history   REHAB POTENTIAL: Good  CLINICAL DECISION MAKING: Evolving/moderate complexity  EVALUATION COMPLEXITY: Moderate   GOALS:   SHORT TERM GOALS: Target date: 07/25/2024   Braniyah will be >75% HEP compliant to improve carryover between sessions and facilitate independent management of condition  Evaluation: ongoing Goal status: MET   LONG TERM GOALS: Target date: 08/22/2024 extended to 10/23/2024    Larosa will self report >/= 50% decrease in pain from evaluation to improve function in daily tasks  Evaluation/Baseline: 10/10 max pain 12/16:  3-4/10 avg Goal status: MET   2.  Priyah will show a >/= 20 pt improvement in LEFS score (MCID is ~11% or 9 pts) as a proxy for functional improvement   Evaluation/Baseline: 31 pts Goal status: Ongoing   3.  Kamber will be able to walk to go grocery shopping, not limited by pain  Evaluation/Baseline: limited 12/16: able to walk, but continues to have increasing pain Goal status: Ongiong    4.  Terea will improve left  ankle DF to 7 degrees   Evaluation/Baseline: lacking 15 degrees 12/16: 5 degrees Goal status: Ongoing   5.  Tashauna will be able to perform bil heel raise without significant w/s, not limited by pain - (update to include SL heel raise 12/16)  Evaluation/Baseline: limited 12/16: MET bil Goal status: MET    PLAN: PT FREQUENCY: 1-2x/week  PT DURATION: 8 weeks  PLANNED INTERVENTIONS:  97164- PT Re-evaluation, 97110-Therapeutic exercises, 97530- Therapeutic activity, 97112- Neuromuscular re-education, 97535- Self Care, 02859- Manual therapy, [id number]- Gait training, [id number]- Aquatic Therapy, [phone]- Electrical stimulation (manual), [id number]- Vasopneumatic device, [id number]- Traction (mechanical), [id number]- Ionotophoresis 4mg /ml Dexamethasone , Taping, Dry Needling, Joint manipulation, and Spinal manipulation.   Helene BRAVO Hartleigh Edmonston PT 09/28/24 9:29 AM   I just finished a MCD eval/recert.  Name: River Mckercher  MRN: [id number] Please request 1x/week for 8 weeks.  Check all conditions that are expected to impact treatment: Musculoskeletal  disorders    Check all possible CPT codes: 02889- Therapeutic Exercise, [phone]- Neuro Re-education, [phone] - Gait Training, [phone] - Manual Therapy, 97530 - Therapeutic Activities, 97535 - Self Care, [phone] - Re-evaluation, [id number] - Mechanical traction, and [id number] - Aquatic therapy   Thank you!  MCD - Secure      "

## 2024-10-02 ENCOUNTER — Ambulatory Visit: Admitting: Physical Therapy

## 2024-10-05 ENCOUNTER — Ambulatory Visit: Admitting: Physical Therapy

## 2024-10-18 ENCOUNTER — Ambulatory Visit: Attending: Sports Medicine | Admitting: Physical Therapy

## 2024-10-18 ENCOUNTER — Encounter: Payer: Self-pay | Admitting: Physical Therapy

## 2024-10-18 ENCOUNTER — Other Ambulatory Visit: Payer: Self-pay

## 2024-10-18 DIAGNOSIS — M25611 Stiffness of right shoulder, not elsewhere classified: Secondary | ICD-10-CM

## 2024-10-18 DIAGNOSIS — G8929 Other chronic pain: Secondary | ICD-10-CM

## 2024-10-18 DIAGNOSIS — M6281 Muscle weakness (generalized): Secondary | ICD-10-CM

## 2024-10-18 NOTE — Therapy (Signed)
 "   PHYSICAL THERAPY DISCHARGE SUMMARY  Visits from Start of Care: 9  Current functional level related to goals / functional outcomes: See assessment/goals   Remaining deficits: See assessment/goals   Education / Equipment: HEP and D/C plans  Patient agrees to discharge. Patient goals were met. Patient is being discharged due to meeting the stated rehab goals.  Patient Name: Tamara Barnes MRN: [id number] DOB:Oct 14, 1965, 59 y.o., female Today's Date: 10/18/2024   PT End of Session - 10/18/24 1145     Visit Number 9    Number of Visits 9    Date for Recertification  10/23/24    Authorization Type [employer]    Authorization Time Period approved 8 PT visits from 8 PT visits from 07/06/24-09/04/24, Approved 4 PT visits from 09/11/24-10/15/24    Authorization - Visit Number 4    Authorization - Number of Visits 4    Activity Tolerance Patient tolerated treatment well           Past Medical History:  Diagnosis Date   Diabetes mellitus without complication (HCC)    Hypertension    Past Surgical History:  Procedure Laterality Date   FRACTURE SURGERY     HAND SURGERY Left    tuabl ligation     TUBAL LIGATION     Patient Active Problem List   Diagnosis Date Noted   Mass of joint of left hand 03/08/2023   Pain of left hand 01/13/2023   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) 08/14/2020   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) 08/14/2020   Nuclear sclerotic cataract of both eyes 08/14/2020   Vitreomacular adhesion of both eyes 08/14/2020   Abdominal pain    E coli bacteremia    Pyelonephritis 04/07/2020   Sinusitis 07/04/2012   Elevated BP 07/04/2012   Rhinitis medicamentosa 07/04/2012   FIBROIDS, UTERUS 02/24/2009   UNSPECIFIED ABNORMAL MAMMOGRAM 02/24/2009   DYSFUNCTIONAL UTERINE BLEEDING 08/02/2008   ANEMIA 06/04/2008   SCIATICA 06/04/2008   TOBACCO DEPENDENCE 11/10/2006    PCP: Vicci Barnie NOVAK,  MD  REFERRING PROVIDER: Verta Royden ONEIDA ROSENA  THERAPY DIAG:  Chronic pain of both shoulders - Plan: Amb Referral To Provider Referral Exercise Program (P.R.E.P)  Muscle weakness (generalized) - Plan: Amb Referral To Provider Referral Exercise Program (P.R.E.P)  Stiffness of right shoulder, not elsewhere classified - Plan: Amb Referral To Provider Referral Exercise Program (P.R.E.P)  REFERRING DIAG: Achilles tendinitis of left lower extremity [M76.62], Plantar fasciitis of left foot [M72.2]   Rationale for Evaluation and Treatment:  Rehabilitation  SUBJECTIVE:  PERTINENT PAST HISTORY:  Smoker, diabetic neuropathy      PRECAUTIONS: None  WEIGHT BEARING RESTRICTIONS No  FALLS:  Has patient fallen in last 6 months? No, Number of falls: 0  MOI/History of condition:  Onset date: ~6 months  SUBJECTIVE STATEMENT  10/18/2024:  Pt reports that her ankle is doing well.  She is happy with her progress and has had minimal pain.  EVAL:  Pt is a 59 y.o. female who presents to clinic with chief complaint of L posterior heel pain which started about 6 months ago when stepping off a curb.  Originally thought to be rupture but MRI show tiny interstitial tear at insertion as well as PF.  Pt states that she had a few stiches and has been in a boot since march (sounds superficial).  States that podiatrist told her to try PT for 3 weeks.  Pt reports the pain can come with no  load almost randomly.  Walking for longer distances is even worse.   Red flags:  denies   Pain:  Are you having pain? Yes Pain location: L distal achilles NPRS scale:  0/10 currently Aggravating factors: any load and randomly Relieving factors: minimal Pain description: sharp and throbbing  Occupation: NA  Assistive Device: NA  Hand Dominance: NA  Patient Goals/Specific Activities: reduce pain and improve function   OBJECTIVE:   DIAGNOSTIC FINDINGS:   IMPRESSION: 1. Moderate distal Achilles tendinosis with  a tiny interstitial tear at the insertion and mild subcortical marrow edema. 2. Moderate plantar fasciitis of the medial band of the plantar fascia.  GENERAL OBSERVATION/GAIT:  Antalgic gait, reduced time in stance R, in CAM boot  SENSATION: Light touch: Appears intact  PALPATION: TTP L achilles tendon  LE ROM:  ROM Right (Eval) Left (Eval) L 12/16  Hip flexion (L2, L3)     Knee extension (L3)     Knee flexion     Hip abduction     Hip extension     Hip external rotation     Hip internal rotation     Hip adduction     Ankle dorsiflexion (L4) 7 Lacking 15 ** 5*  Ankle plantarflexion (S1)     Ankle inversion 30 30*   Ankle eversion 30 10*   Great Toe ext (L5)     Grossly      (Blank rows = not tested, score listed is out of 5 possible points OR may be listed in lbs of force.  N = WNL, D = diminished, C = clear for gross weakness with myotome testing, * = concordant pain with testing)  LE MMT:  MMT Right (Eval) Left (Eval) L 12/16  Hip flexion     Hip extension     Hip abduction     Hip adduction     Hip internal rotation     Hip external rotation     Knee extension     Knee flexion     Ankle dorsiflexion n 3+* 4  Ankle plantarflexion n 3* 3+*  Ankle inversion n 3+* 4  Ankle eversion n 3+* 4   (Blank rows = not tested, N = WNL, * = concordant pain with testing)  Functional Tests  Eval    Unable to FWB L foot d/t pain                                                          SPECIAL TESTS:  NA   PATIENT SURVEYS:  LEFS: 31/80    HOME EXERCISE PROGRAM: Access Code: [email or web address] URL: [email or web address] Date: 09/11/2024 Prepared by: Helene Gasmen  Exercises - Standing Heel Raise with Support  - 1 x daily - 7 x weekly - 1 sets - 5 reps - 8 second hold - Sit to Stand Without Arm Support  - 1 x daily - 4-7 x weekly - 3 sets - 5 reps - Standing Hip Abduction with Resistance at Ankles and Unilateral Counter Support  - 1  x daily - 7 x weekly - 2 sets - 10 reps  Treatment priorities   Eval 12/16       DF and eversion ROM Heavier loading       Progressive loading        Gait  and w/b                         TODAY'S TREATMENT: [facility] Adult PT Treatment:                                                DATE: 10/19/23 Therapeutic Exercise:  Bike - 5'   Therapeutic Activity STS - 3x5 - 20# Bil heel raise w/ L w/s - 8'' 4x Checking goals and reviewing with pt  Manual Therapy STM and IASTM L GS complex and achilles   [facility] Adult PT Treatment  08/07/2024:  Therapeutic Exercise: nu-step L5 30m while taking subjective and planning session with patient Inv/eversion - 3x50 PF/DF - 3x50 DF stretch with sheet - 45'' x3 Heel raise 25# - 6x10s PF with blue TB - 2x6x6s  Manual Therapy  STM L GS complex and achilles     ASSESSMENT:  CLINICAL IMPRESSION: 10/18/2024: Tamara Barnes tolerated session well with no adverse reaction.  Upon goal recheck pt has met all short and long term goals and is ready for D/C.  Not limited in daily tasks at this point.  Will continue isometrics at home and wean from heel lifts.   EVAL: Tamara Barnes is a 59 y.o. female who presents to clinic with signs and sxs consistent with L severe achilles pain which started about 6 months ago.  Has been in booth the entire time per her report.  Significant pain and tenderness to light touch and w/b.  Started with light ROM and strengthening as she is significantly lacking DF ROM.   Tamara Barnes will benefit from skilled PT to address relevant deficits and improve comfort and ability to complete daily tasks.   OBJECTIVE IMPAIRMENTS: Pain, ankle ROM, ankle strength, gait, balance  ACTIVITY LIMITATIONS: standing, walking, sitting, sleeping, shopping, housework, self care  PERSONAL FACTORS: See medical history and pertinent history   REHAB POTENTIAL: Good  CLINICAL DECISION MAKING: Evolving/moderate complexity  EVALUATION COMPLEXITY:  Moderate   GOALS:   SHORT TERM GOALS: Target date: 07/25/2024   [place] will be >75% HEP compliant to improve carryover between sessions and facilitate independent management of condition  Evaluation: ongoing Goal status: MET   LONG TERM GOALS: Target date: 08/22/2024 extended to 10/23/2024    Tamara Barnes will self report >/= 50% decrease in pain from evaluation to improve function in daily tasks  Evaluation/Baseline: 10/10 max pain 12/16: 3-4/10 avg Goal status: MET   2.  Tamara Barnes will show a >/= 20 pt improvement in LEFS score (MCID is ~11% or 9 pts) as a proxy for functional improvement   Evaluation/Baseline: 31 pts 2/5: 53 pts Goal status: MET   3.  Tamara Barnes will be able to walk to go grocery shopping, not limited by pain  Evaluation/Baseline: limited 12/16: able to walk, but continues to have increasing pain 2/5: able to walk and do daily tasks with minimal pain Goal status: MET    4.  Tamara Barnes will improve left  ankle DF to 7 degrees   Evaluation/Baseline: lacking 15 degrees 12/16: 5 degrees 2/5: 8 degrees Goal status: MET   5.  Tamara Barnes will be able to perform bil heel raise without significant w/s, not limited by pain - (update to include SL heel raise 12/16)  Evaluation/Baseline: limited 12/16: MET bil 2/5: MEt Goal status: MET    PLAN: PT  FREQUENCY: 1-2x/week  PT DURATION: 8 weeks  PLANNED INTERVENTIONS:  97164- PT Re-evaluation, 97110-Therapeutic exercises, 97530- Therapeutic activity, [id number]- Neuromuscular re-education, 97535- Self Care, 02859- Manual therapy, [id number]- Gait training, [id number]- Aquatic Therapy, [phone]- Electrical stimulation (manual), [id number]- Vasopneumatic device, [id number]- Traction (mechanical), [id number]- Ionotophoresis 4mg /ml Dexamethasone , Taping, Dry Needling, Joint manipulation, and Spinal manipulation.   Keyion Knack E Amrom Ore PT 10/18/24 12:30 PM   I just finished a [facility]CD eval/recert.  Name: Lonetta Blassingame  MRN: [id number] Please  request 1x/week for 8 weeks.  Check all conditions that are expected to impact treatment: Musculoskeletal disorders    Check all possible CPT codes: 02889- Therapeutic Exercise, [phone]- Neuro Re-education, [phone] - Gait Training, [phone] - Manual Therapy, 97530 - Therapeutic Activities, 97535 - Self Care, [phone] - Re-evaluation, [id number] - Mechanical traction, and [id number] - Aquatic therapy   Thank you!  MCD - Secure     "

## 2024-10-19 ENCOUNTER — Other Ambulatory Visit: Payer: Self-pay

## 2024-10-23 ENCOUNTER — Other Ambulatory Visit

## 2024-10-25 ENCOUNTER — Institutional Professional Consult (permissible substitution) (INDEPENDENT_AMBULATORY_CARE_PROVIDER_SITE_OTHER)

## 2024-10-30 ENCOUNTER — Ambulatory Visit: Payer: Self-pay | Admitting: Internal Medicine

## 2024-10-30 ENCOUNTER — Other Ambulatory Visit

## 2024-10-30 ENCOUNTER — Ambulatory Visit: Admitting: Podiatry

## 2024-12-31 ENCOUNTER — Ambulatory Visit: Payer: Self-pay | Admitting: Internal Medicine
